# Patient Record
Sex: Female | Born: 1965 | Race: Black or African American | Hispanic: No | Marital: Single | Smoking: Current every day smoker
Health system: Southern US, Community
[De-identification: ages and names within clinical notes are randomized; demographics above are authoritative.]

## PROBLEM LIST (undated history)

## (undated) DIAGNOSIS — N611 Abscess of the breast and nipple: Secondary | ICD-10-CM

## (undated) DIAGNOSIS — I10 Essential (primary) hypertension: Secondary | ICD-10-CM

## (undated) DIAGNOSIS — N179 Acute kidney failure, unspecified: Secondary | ICD-10-CM

## (undated) HISTORY — PX: OTHER SURGICAL HISTORY: SHX169

---

## 1998-09-03 ENCOUNTER — Emergency Department (HOSPITAL_COMMUNITY): Admission: EM | Admit: 1998-09-03 | Discharge: 1998-09-03 | Payer: Self-pay | Admitting: Emergency Medicine

## 1999-01-23 ENCOUNTER — Emergency Department (HOSPITAL_COMMUNITY): Admission: EM | Admit: 1999-01-23 | Discharge: 1999-01-23 | Payer: Self-pay

## 1999-02-05 ENCOUNTER — Emergency Department (HOSPITAL_COMMUNITY): Admission: EM | Admit: 1999-02-05 | Discharge: 1999-02-05 | Payer: Self-pay | Admitting: Emergency Medicine

## 1999-08-30 ENCOUNTER — Inpatient Hospital Stay (HOSPITAL_COMMUNITY): Admission: EM | Admit: 1999-08-30 | Discharge: 1999-09-01 | Payer: Self-pay

## 1999-12-03 ENCOUNTER — Emergency Department (HOSPITAL_COMMUNITY): Admission: EM | Admit: 1999-12-03 | Discharge: 1999-12-03 | Payer: Self-pay | Admitting: Emergency Medicine

## 1999-12-20 ENCOUNTER — Encounter: Admission: RE | Admit: 1999-12-20 | Discharge: 1999-12-20 | Payer: Self-pay | Admitting: Internal Medicine

## 2000-01-18 ENCOUNTER — Emergency Department (HOSPITAL_COMMUNITY): Admission: EM | Admit: 2000-01-18 | Discharge: 2000-01-19 | Payer: Self-pay | Admitting: Emergency Medicine

## 2000-01-19 ENCOUNTER — Encounter: Payer: Self-pay | Admitting: Emergency Medicine

## 2000-01-29 ENCOUNTER — Encounter: Admission: RE | Admit: 2000-01-29 | Discharge: 2000-01-29 | Payer: Self-pay | Admitting: Internal Medicine

## 2000-02-14 ENCOUNTER — Emergency Department (HOSPITAL_COMMUNITY): Admission: EM | Admit: 2000-02-14 | Discharge: 2000-02-15 | Payer: Self-pay | Admitting: Emergency Medicine

## 2000-02-14 ENCOUNTER — Encounter: Payer: Self-pay | Admitting: Emergency Medicine

## 2000-02-25 ENCOUNTER — Emergency Department (HOSPITAL_COMMUNITY): Admission: EM | Admit: 2000-02-25 | Discharge: 2000-02-25 | Payer: Self-pay | Admitting: Emergency Medicine

## 2000-03-09 ENCOUNTER — Emergency Department (HOSPITAL_COMMUNITY): Admission: EM | Admit: 2000-03-09 | Discharge: 2000-03-09 | Payer: Self-pay | Admitting: Emergency Medicine

## 2000-03-18 ENCOUNTER — Encounter: Admission: RE | Admit: 2000-03-18 | Discharge: 2000-03-18 | Payer: Self-pay | Admitting: Internal Medicine

## 2000-07-27 ENCOUNTER — Emergency Department (HOSPITAL_COMMUNITY): Admission: EM | Admit: 2000-07-27 | Discharge: 2000-07-27 | Payer: Self-pay | Admitting: Emergency Medicine

## 2000-12-30 ENCOUNTER — Encounter: Admission: RE | Admit: 2000-12-30 | Discharge: 2000-12-30 | Payer: Self-pay | Admitting: Internal Medicine

## 2001-01-11 ENCOUNTER — Encounter: Admission: RE | Admit: 2001-01-11 | Discharge: 2001-01-11 | Payer: Self-pay | Admitting: Internal Medicine

## 2001-07-02 ENCOUNTER — Emergency Department (HOSPITAL_COMMUNITY): Admission: EM | Admit: 2001-07-02 | Discharge: 2001-07-02 | Payer: Self-pay | Admitting: Emergency Medicine

## 2001-07-02 ENCOUNTER — Encounter: Payer: Self-pay | Admitting: Emergency Medicine

## 2001-11-29 ENCOUNTER — Emergency Department (HOSPITAL_COMMUNITY): Admission: EM | Admit: 2001-11-29 | Discharge: 2001-11-29 | Payer: Self-pay | Admitting: Emergency Medicine

## 2001-12-12 ENCOUNTER — Emergency Department (HOSPITAL_COMMUNITY): Admission: EM | Admit: 2001-12-12 | Discharge: 2001-12-12 | Payer: Self-pay | Admitting: Emergency Medicine

## 2001-12-12 ENCOUNTER — Encounter: Payer: Self-pay | Admitting: Emergency Medicine

## 2002-03-17 ENCOUNTER — Encounter (INDEPENDENT_AMBULATORY_CARE_PROVIDER_SITE_OTHER): Payer: Self-pay | Admitting: Specialist

## 2002-03-17 ENCOUNTER — Encounter: Admission: RE | Admit: 2002-03-17 | Discharge: 2002-03-17 | Payer: Self-pay | Admitting: Obstetrics and Gynecology

## 2002-03-17 ENCOUNTER — Other Ambulatory Visit: Admission: RE | Admit: 2002-03-17 | Discharge: 2002-03-17 | Payer: Self-pay | Admitting: Obstetrics and Gynecology

## 2002-03-31 ENCOUNTER — Ambulatory Visit (HOSPITAL_COMMUNITY): Admission: RE | Admit: 2002-03-31 | Discharge: 2002-03-31 | Payer: Self-pay | Admitting: Obstetrics and Gynecology

## 2002-08-02 ENCOUNTER — Encounter: Admission: RE | Admit: 2002-08-02 | Discharge: 2002-08-02 | Payer: Self-pay | Admitting: Internal Medicine

## 2002-08-05 ENCOUNTER — Ambulatory Visit (HOSPITAL_COMMUNITY): Admission: RE | Admit: 2002-08-05 | Discharge: 2002-08-05 | Payer: Self-pay | Admitting: Internal Medicine

## 2002-11-16 ENCOUNTER — Emergency Department (HOSPITAL_COMMUNITY): Admission: EM | Admit: 2002-11-16 | Discharge: 2002-11-16 | Payer: Self-pay | Admitting: Emergency Medicine

## 2002-11-16 ENCOUNTER — Encounter: Payer: Self-pay | Admitting: Emergency Medicine

## 2003-01-05 ENCOUNTER — Emergency Department (HOSPITAL_COMMUNITY): Admission: EM | Admit: 2003-01-05 | Discharge: 2003-01-05 | Payer: Self-pay | Admitting: Emergency Medicine

## 2003-05-10 ENCOUNTER — Emergency Department (HOSPITAL_COMMUNITY): Admission: EM | Admit: 2003-05-10 | Discharge: 2003-05-10 | Payer: Self-pay | Admitting: Emergency Medicine

## 2003-05-10 ENCOUNTER — Encounter: Payer: Self-pay | Admitting: Emergency Medicine

## 2003-06-15 ENCOUNTER — Encounter: Admission: RE | Admit: 2003-06-15 | Discharge: 2003-06-15 | Payer: Self-pay | Admitting: Internal Medicine

## 2003-07-31 ENCOUNTER — Encounter: Payer: Self-pay | Admitting: Emergency Medicine

## 2003-07-31 ENCOUNTER — Emergency Department (HOSPITAL_COMMUNITY): Admission: EM | Admit: 2003-07-31 | Discharge: 2003-07-31 | Payer: Self-pay | Admitting: Emergency Medicine

## 2003-09-12 ENCOUNTER — Encounter: Admission: RE | Admit: 2003-09-12 | Discharge: 2003-09-12 | Payer: Self-pay | Admitting: Internal Medicine

## 2003-09-15 ENCOUNTER — Ambulatory Visit (HOSPITAL_COMMUNITY): Admission: RE | Admit: 2003-09-15 | Discharge: 2003-09-15 | Payer: Self-pay | Admitting: Internal Medicine

## 2004-03-01 ENCOUNTER — Emergency Department (HOSPITAL_COMMUNITY): Admission: EM | Admit: 2004-03-01 | Discharge: 2004-03-01 | Payer: Self-pay | Admitting: Emergency Medicine

## 2004-07-30 ENCOUNTER — Emergency Department (HOSPITAL_COMMUNITY): Admission: EM | Admit: 2004-07-30 | Discharge: 2004-07-31 | Payer: Self-pay

## 2004-08-02 ENCOUNTER — Emergency Department (HOSPITAL_COMMUNITY): Admission: EM | Admit: 2004-08-02 | Discharge: 2004-08-03 | Payer: Self-pay | Admitting: Emergency Medicine

## 2004-09-21 ENCOUNTER — Emergency Department (HOSPITAL_COMMUNITY): Admission: EM | Admit: 2004-09-21 | Discharge: 2004-09-21 | Payer: Self-pay | Admitting: Family Medicine

## 2004-09-22 ENCOUNTER — Emergency Department (HOSPITAL_COMMUNITY): Admission: EM | Admit: 2004-09-22 | Discharge: 2004-09-22 | Payer: Self-pay | Admitting: Emergency Medicine

## 2004-09-30 ENCOUNTER — Emergency Department (HOSPITAL_COMMUNITY): Admission: EM | Admit: 2004-09-30 | Discharge: 2004-10-01 | Payer: Self-pay | Admitting: Emergency Medicine

## 2004-11-05 ENCOUNTER — Ambulatory Visit: Payer: Self-pay | Admitting: Internal Medicine

## 2005-08-06 ENCOUNTER — Emergency Department (HOSPITAL_COMMUNITY): Admission: EM | Admit: 2005-08-06 | Discharge: 2005-08-06 | Payer: Self-pay | Admitting: Emergency Medicine

## 2007-10-22 ENCOUNTER — Emergency Department (HOSPITAL_COMMUNITY): Admission: EM | Admit: 2007-10-22 | Discharge: 2007-10-22 | Payer: Self-pay | Admitting: Family Medicine

## 2009-02-18 ENCOUNTER — Emergency Department (HOSPITAL_COMMUNITY): Admission: EM | Admit: 2009-02-18 | Discharge: 2009-02-18 | Payer: Self-pay | Admitting: Emergency Medicine

## 2010-02-06 ENCOUNTER — Emergency Department (HOSPITAL_COMMUNITY): Admission: EM | Admit: 2010-02-06 | Discharge: 2010-02-07 | Payer: Self-pay | Admitting: Emergency Medicine

## 2010-02-07 ENCOUNTER — Ambulatory Visit (HOSPITAL_COMMUNITY): Admission: RE | Admit: 2010-02-07 | Discharge: 2010-02-07 | Payer: Self-pay | Admitting: Orthopedic Surgery

## 2010-07-07 ENCOUNTER — Ambulatory Visit: Payer: Self-pay | Admitting: Internal Medicine

## 2010-07-07 ENCOUNTER — Inpatient Hospital Stay (HOSPITAL_COMMUNITY): Admission: EM | Admit: 2010-07-07 | Discharge: 2010-07-09 | Payer: Self-pay | Admitting: Emergency Medicine

## 2010-07-15 ENCOUNTER — Telehealth (INDEPENDENT_AMBULATORY_CARE_PROVIDER_SITE_OTHER): Payer: Self-pay | Admitting: *Deleted

## 2010-07-18 ENCOUNTER — Telehealth (INDEPENDENT_AMBULATORY_CARE_PROVIDER_SITE_OTHER): Payer: Self-pay | Admitting: *Deleted

## 2010-07-22 ENCOUNTER — Telehealth (INDEPENDENT_AMBULATORY_CARE_PROVIDER_SITE_OTHER): Payer: Self-pay

## 2010-07-23 ENCOUNTER — Telehealth (INDEPENDENT_AMBULATORY_CARE_PROVIDER_SITE_OTHER): Payer: Self-pay | Admitting: *Deleted

## 2010-07-24 ENCOUNTER — Encounter: Payer: Self-pay | Admitting: Cardiovascular Disease

## 2010-07-24 ENCOUNTER — Ambulatory Visit: Payer: Self-pay

## 2010-07-24 ENCOUNTER — Encounter (HOSPITAL_COMMUNITY): Admission: RE | Admit: 2010-07-24 | Discharge: 2010-08-16 | Payer: Self-pay | Admitting: Cardiology

## 2010-07-24 ENCOUNTER — Ambulatory Visit: Payer: Self-pay | Admitting: Cardiovascular Disease

## 2010-08-06 ENCOUNTER — Ambulatory Visit: Payer: Self-pay | Admitting: Family Medicine

## 2010-08-06 ENCOUNTER — Encounter: Payer: Self-pay | Admitting: Physician Assistant

## 2010-08-06 DIAGNOSIS — E785 Hyperlipidemia, unspecified: Secondary | ICD-10-CM

## 2010-08-06 DIAGNOSIS — F411 Generalized anxiety disorder: Secondary | ICD-10-CM | POA: Insufficient documentation

## 2010-08-06 DIAGNOSIS — I10 Essential (primary) hypertension: Secondary | ICD-10-CM

## 2010-08-06 DIAGNOSIS — F17200 Nicotine dependence, unspecified, uncomplicated: Secondary | ICD-10-CM | POA: Insufficient documentation

## 2010-08-23 ENCOUNTER — Ambulatory Visit: Payer: Self-pay | Admitting: Physician Assistant

## 2010-08-23 ENCOUNTER — Ambulatory Visit: Payer: Self-pay | Admitting: Internal Medicine

## 2010-08-23 LAB — CONVERTED CEMR LAB
BUN: 10 mg/dL (ref 6–23)
Bilirubin Urine: NEGATIVE
Blood in Urine, dipstick: NEGATIVE
CO2: 26 meq/L (ref 19–32)
Calcium: 8.8 mg/dL (ref 8.4–10.5)
Chloride: 101 meq/L (ref 96–112)
Creatinine, Ser: 0.74 mg/dL (ref 0.40–1.20)
Glucose, Bld: 80 mg/dL (ref 70–99)
Glucose, Urine, Semiquant: NEGATIVE
Ketones, urine, test strip: NEGATIVE
Nitrite: NEGATIVE
Potassium: 3.8 meq/L (ref 3.5–5.3)
Protein, U semiquant: NEGATIVE
Sodium: 138 meq/L (ref 135–145)
Specific Gravity, Urine: 1.015
Urobilinogen, UA: 0.2
WBC Urine, dipstick: NEGATIVE
pH: 6

## 2010-08-27 ENCOUNTER — Encounter: Payer: Self-pay | Admitting: Physician Assistant

## 2010-09-30 ENCOUNTER — Emergency Department (HOSPITAL_COMMUNITY): Admission: EM | Admit: 2010-09-30 | Discharge: 2010-09-30 | Payer: Self-pay | Admitting: Emergency Medicine

## 2010-12-14 ENCOUNTER — Encounter: Payer: Self-pay | Admitting: Internal Medicine

## 2010-12-26 NOTE — Letter (Signed)
Summary: *HSN Results Follow up  [facility]  [address] [place], [place] [address]   Phone: [phone]  Fax: [phone]      08/27/2010   Tracie Estrada Heisler [address] [place], [place]  [address]   Dear  Ms. [name],                            ____S.Drinkard,FNP   ____D. Gore,FNP       ____B. McPherson,MD   ____V. Rankins,MD    ____E. Mulberry,MD    ____N. Daphine Deutscher, FNP  ____D. Reche Dixon, MD    ____K. Philipp Deputy, MD    __x__S. Alben Spittle, PA-C     This letter is to inform you that your recent test(s):  _______Pap Smear    ___x____Lab Test     _______X-ray    ___x____ is within acceptable limits  _______ requires a medication change  _______ requires a follow-up lab visit  _______ requires a follow-up visit with your [name]   Comments:       _________________________________________________________ If you have any questions, please contact our office                     Sincerely,  Tereso Newcomer PA-C [facility]
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
## 2010-12-26 NOTE — Progress Notes (Signed)
Summary: nuc pre procedure  Phone Note Outgoing Call   Call placed by: Darrick Penna Call placed to: Patient Reason for Call: Confirm/change Appt Summary of Call: Home ph #  is disconnected.  Contact person is Rosemarie Ax at [phone]. Spoke with her and she will try to contact pt and have her call us back.     Nuclear Med Background Indications for Stress Test: Evaluation for Ischemia, Post Hospital, Abnormal EKG  Indications Comments: T wave changes secondary to LV hypertrophy Enzymes neg    Symptoms: Chest Pain  Symptoms Comments: Hx of panic attacks    Nuclear Pre-Procedure Cardiac Risk Factors: Hypertension, Lipids, Smoker

## 2010-12-26 NOTE — Progress Notes (Signed)
Summary: Nuc Pre-Procedure  Phone Note Outgoing Call Call back at [phone]   Call placed by: Antionette Char RN,  July 18, 2010 11:27 AM Call placed to: Patient Reason for Call: Confirm/change Appt Summary of Call: Reviewed information on Myoview Information Sheet (see scanned document for further details).  Spoke with sister Elana Alm.     Nuclear Med Background Indications for Stress Test: Evaluation for Ischemia, Post Hospital, Abnormal EKG  Indications Comments: T wave changes secondary to LV hypertrophy Enzymes neg    Symptoms: Chest Pain  Symptoms Comments: Hx of panic attacks    Nuclear Pre-Procedure Cardiac Risk Factors: Hypertension, Lipids, Smoker

## 2010-12-26 NOTE — Progress Notes (Signed)
Summary: Sedation for myoview due to claustrophobia  Phone Note Outgoing Call   Call placed by: Irean Hong, RN,  July 22, 2010 2:49 PM Summary of Call: The patient here for myoview, and unable to tolerate the camera due to claustrophobia. The patient doesn't have a ride and prefers to call us back to reschedule so she can have sedation. Xanax 0.5 mg by mouth prior to images the day of myoview as long as the patient has a driver per Dr. Algis Downs. [name]. Patsy Edwards,RN.

## 2010-12-26 NOTE — Letter (Signed)
Summary: PT INFORMATION SHEET  PT INFORMATION SHEET   Imported By: Arta Bruce 08/08/2010 10:54:03  _____________________________________________________________________  External Attachment:    Type:   Image     Comment:   External Document

## 2010-12-26 NOTE — Assessment & Plan Note (Signed)
Summary: Cardiology Nuclear Testing  Nuclear Med Background Indications for Stress Test: Evaluation for Ischemia, Post Hospital, Abnormal EKG  Indications Comments: 07/09/10 chest pain, MI r/o    Symptoms: Chest Pain, Chest Pressure, Chest Pressure with Exertion, DOE  Symptoms Comments: .   Nuclear Pre-Procedure Cardiac Risk Factors: Hypertension, Lipids, Smoker Caffeine/Decaff Intake: None NPO After: 10:00 PM Lungs: clear  IV 0.9% NS with Angio Cath: 22g     IV Site: R Antecubital IV Started by: Irean Hong, RN Chest Size (in) 42     Cup Size D     Height (in): 59 Weight (lb): 162 BMI: 32.84 Tech Comments: Held coreg 24 hrs. Alprazolam 0.5 mg by mouth on arrival due to claustrophobia per Dr. Golden Circle.  Nuclear Med Study 1 or 2 day study:  1 day     Stress Test Type:  Eugenie Birks Reading MD:  Charlton Haws, MD     Referring MD:  J.Katz Resting Radionuclide:  Technetium 40m Tetrofosmin     Resting Radionuclide Dose:  10.7 mCi  Stress Radionuclide:  Technetium 25m Tetrofosmin     Stress Radionuclide Dose:  33 mCi   Stress Protocol   Lexiscan: 0.4 mg   Stress Test Technologist:  Milana Na, EMT-P     Nuclear Technologist:  Doyne Keel, CNMT  Rest Procedure  Myocardial perfusion imaging was performed at rest 45 minutes following the intravenous administration of Technetium 40m Tetrofosmin.  Stress Procedure  The patient received IV Lexiscan 0.4 mg over 15-seconds.  Technetium 83m Tetrofosmin injected at 30-seconds.  There were no significant changes with infusion.  Quantitative spect images were obtained after a 45 minute delay.  QPS Raw Data Images:  Normal; no motion artifact; normal heart/lung ratio. Stress Images:  Normal homogeneous uptake in all areas of the myocardium. Rest Images:  Normal homogeneous uptake in all areas of the myocardium. Subtraction (SDS):  SDS 2 Transient Ischemic Dilatation:  1.02  (Normal <1.22)  Lung/Heart Ratio:  .16  (Normal  <0.45)  Quantitative Gated Spect Images QGS EDV:  106 ml QGS ESV:  46 ml QGS EF:  56 % QGS cine images:  LVE low normal EF  Findings Normal nuclear study      Overall Impression  Exercise Capacity: Lexiscan with no exercise. BP Response: Normal blood pressure response. Clinical Symptoms: Fatigue and SOB ECG Impression: Inferolateral T wave changes Overall Impression: No ischemia or infarct.  Appears to have some LVE  RV also prominant  Appended Document: Cardiology Nuclear Testing What is the f/u ?  Appended Document: Cardiology Nuclear Testing she is sch to see Dr [name] on 9/14

## 2010-12-26 NOTE — Progress Notes (Signed)
Summary: Nuclear pre procedure  Phone Note Outgoing Call Call back at [facility] Phone [phone]   Call placed by: Rea College, CMA,  July 23, 2010 11:43 AM Call placed to: Patient Summary of Call: Reviewed information on Myoview Information Sheet (see scanned document for further details).  Spoke with patient's sister.      Nuclear Med Background Indications for Stress Test: Evaluation for Ischemia, Post Hospital, Abnormal EKG  Indications Comments: 07/09/10 chest pain, MI r/o    Symptoms: Chest Pain, Chest Pressure, Chest Pressure with Exertion, DOE  Symptoms Comments: .   Nuclear Pre-Procedure Cardiac Risk Factors: Hypertension, Lipids, Smoker

## 2011-01-09 NOTE — Letter (Signed)
Summary: PSYCHOLOGY SUMMARY  PSYCHOLOGY SUMMARY   Imported By: Arta Bruce 12/31/2010 09:52:44  _____________________________________________________________________  External Attachment:    Type:   Image     Comment:   External Document

## 2011-02-06 LAB — CBC
HCT: 37.5 % (ref 36.0–46.0)
Hemoglobin: 12.9 g/dL (ref 12.0–15.0)
MCH: 29 pg (ref 26.0–34.0)
MCHC: 34.4 g/dL (ref 30.0–36.0)
MCV: 84.3 fL (ref 78.0–100.0)
Platelets: 331 10*3/uL (ref 150–400)
RBC: 4.45 MIL/uL (ref 3.87–5.11)
RDW: 14.4 % (ref 11.5–15.5)
WBC: 9.4 10*3/uL (ref 4.0–10.5)

## 2011-02-06 LAB — LIPID PANEL
Cholesterol: 171 mg/dL (ref 0–200)
HDL: 37 mg/dL — ABNORMAL LOW (ref >39)
LDL Cholesterol: 101 mg/dL — ABNORMAL HIGH (ref 0–99)
Total CHOL/HDL Ratio: 4.6 RATIO
Triglycerides: 164 mg/dL — ABNORMAL HIGH (ref <150)
VLDL: 33 mg/dL (ref 0–40)

## 2011-02-06 LAB — BASIC METABOLIC PANEL
BUN: 11 mg/dL (ref 6–23)
CO2: 25 mEq/L (ref 19–32)
Calcium: 8.9 mg/dL (ref 8.4–10.5)
Chloride: 104 mEq/L (ref 96–112)
Creatinine, Ser: 0.71 mg/dL (ref 0.4–1.2)
GFR calc Af Amer: >60 mL/min (ref >60)
GFR calc non Af Amer: >60 mL/min (ref >60)
Glucose, Bld: 100 mg/dL — ABNORMAL HIGH (ref 70–99)
Potassium: 3.6 mEq/L (ref 3.5–5.1)
Sodium: 139 mEq/L (ref 135–145)

## 2011-02-06 LAB — GLUCOSE, CAPILLARY: Glucose-Capillary: 95 mg/dL (ref 70–99)

## 2011-02-06 LAB — MRSA PCR SCREENING: MRSA by PCR: POSITIVE — AB

## 2011-02-07 LAB — DIFFERENTIAL
Basophils Relative: 0 % (ref 0–1)
Eosinophils Absolute: 0.2 10*3/uL (ref 0.0–0.7)
Eosinophils Relative: 2 % (ref 0–5)
Monocytes Absolute: 0.7 10*3/uL (ref 0.1–1.0)
Monocytes Relative: 7 % (ref 3–12)
Neutrophils Relative %: 58 % (ref 43–77)

## 2011-02-07 LAB — URINALYSIS, ROUTINE W REFLEX MICROSCOPIC
Glucose, UA: NEGATIVE mg/dL
Nitrite: NEGATIVE
pH: 6.5 (ref 5.0–8.0)

## 2011-02-07 LAB — BASIC METABOLIC PANEL
BUN: 8 mg/dL (ref 6–23)
CO2: 27 mEq/L (ref 19–32)
Calcium: 8.6 mg/dL (ref 8.4–10.5)
Chloride: 102 mEq/L (ref 96–112)
Creatinine, Ser: 0.67 mg/dL (ref 0.4–1.2)
GFR calc Af Amer: >60 mL/min (ref >60)
GFR calc non Af Amer: >60 mL/min (ref >60)
Glucose, Bld: 99 mg/dL (ref 70–99)
Potassium: 2.8 mEq/L — ABNORMAL LOW (ref 3.5–5.1)
Sodium: 137 mEq/L (ref 135–145)

## 2011-02-07 LAB — CARDIAC PANEL(CRET KIN+CKTOT+MB+TROPI)
CK, MB: 2.7 ng/mL (ref 0.3–4.0)
Relative Index: 1.3 (ref 0.0–2.5)
Total CK: 205 U/L — ABNORMAL HIGH (ref 7–177)
Troponin I: 0.01 ng/mL (ref 0.00–0.06)

## 2011-02-07 LAB — HEMOGLOBIN A1C
Hgb A1c MFr Bld: 5.8 % — ABNORMAL HIGH (ref <5.7)
Mean Plasma Glucose: 120 mg/dL — ABNORMAL HIGH (ref <117)

## 2011-02-07 LAB — CBC
Hemoglobin: 12.1 g/dL (ref 12.0–15.0)
MCH: 29.8 pg (ref 26.0–34.0)
MCHC: 35.8 g/dL (ref 30.0–36.0)
MCV: 83.3 fL (ref 78.0–100.0)

## 2011-02-07 LAB — POCT CARDIAC MARKERS: Myoglobin, poc: 74 ng/mL (ref 12–200)

## 2011-02-07 LAB — URINE MICROSCOPIC-ADD ON

## 2011-02-16 LAB — CBC
HCT: 40 % (ref 36.0–46.0)
Platelets: 356 10*3/uL (ref 150–400)
RDW: 14.5 % (ref 11.5–15.5)
WBC: 9.9 10*3/uL (ref 4.0–10.5)

## 2011-02-16 LAB — POCT I-STAT, CHEM 8
Creatinine, Ser: 0.8 mg/dL (ref 0.4–1.2)
Glucose, Bld: 93 mg/dL (ref 70–99)
HCT: 42 % (ref 36.0–46.0)
Hemoglobin: 14.3 g/dL (ref 12.0–15.0)
Potassium: 3.4 mEq/L — ABNORMAL LOW (ref 3.5–5.1)
TCO2: 33 mmol/L (ref 0–100)

## 2011-02-16 LAB — BASIC METABOLIC PANEL
BUN: 11 mg/dL (ref 6–23)
Calcium: 8.9 mg/dL (ref 8.4–10.5)
Creatinine, Ser: 0.72 mg/dL (ref 0.4–1.2)
GFR calc non Af Amer: >60 mL/min (ref >60)
Glucose, Bld: 95 mg/dL (ref 70–99)
Potassium: 3.1 mEq/L — ABNORMAL LOW (ref 3.5–5.1)

## 2011-02-16 LAB — URINALYSIS, ROUTINE W REFLEX MICROSCOPIC
Bilirubin Urine: NEGATIVE
Glucose, UA: NEGATIVE mg/dL
Ketones, ur: 15 mg/dL — AB
Specific Gravity, Urine: 1.01 (ref 1.005–1.030)
pH: 6 (ref 5.0–8.0)

## 2011-02-16 LAB — URINE MICROSCOPIC-ADD ON

## 2011-04-11 NOTE — Consult Note (Signed)
[place]  Patient:    Tracie Estrada, Tracie Estrada Visit Number: [id number] MRN: [id number]          Service Type: Attending:  Elinor Dodge, M.D. Dictated by:   Elinor Dodge, M.D. Proc. Date: 03/17/02                            Consultation Report  REASON FOR CONSULTATION:  The patient is a 45 year old black female who has had a history of oligomenorrhea and had regular periods this past year up through March. Her April period had started on time but since the beginning has gone on and off every few days.  PAST MEDICAL HISTORY:  The patient has a history of pseudotumor cerebri for which she in the past had taken Diamox. She has not taken Diamox within two years, had a recent checkup in which her eye-ground and visual fields were normal, although no other diagnostic tests were done. She is now on hydrochlorothiazide for hypertension, has has one pregnancy, and has never used contraception.  RULE OUT IMPRESSION:  Menometrorrhagia.  PHYSICAL EXAMINATION:  PELVIC:  EGBUS within normal limits. The vagina clean, well rugated. Cervix has a moderate amount of bleeding from paracervical os and the uterus is upper limits of normal. The adnexa could not be well palpated.  PLAN:  Get a pelvic sonogram and then to observe throughout this cycle and to see, then decide on therapy if the bleeding continues. I have an aversion to using oral contraceptives since this is one of the etiologies occasionally for pseudotumorous cerebri. Dictated by:   Elinor Dodge, M.D. Attending:  Elinor Dodge, M.D. DD:  03/17/02 TD:  03/17/02 Job: [id number]

## 2013-02-16 ENCOUNTER — Emergency Department (HOSPITAL_COMMUNITY)
Admission: EM | Admit: 2013-02-16 | Discharge: 2013-02-16 | Disposition: A | Discharge status: Home / Self Care | Payer: Self-pay | Attending: Emergency Medicine | Admitting: Emergency Medicine

## 2013-02-16 ENCOUNTER — Emergency Department (HOSPITAL_COMMUNITY): Payer: Self-pay

## 2013-02-16 ENCOUNTER — Encounter (HOSPITAL_COMMUNITY): Payer: Self-pay | Admitting: Emergency Medicine

## 2013-02-16 DIAGNOSIS — I160 Hypertensive urgency: Secondary | ICD-10-CM

## 2013-02-16 DIAGNOSIS — I10 Essential (primary) hypertension: Secondary | ICD-10-CM | POA: Insufficient documentation

## 2013-02-16 DIAGNOSIS — F17200 Nicotine dependence, unspecified, uncomplicated: Secondary | ICD-10-CM | POA: Insufficient documentation

## 2013-02-16 DIAGNOSIS — M549 Dorsalgia, unspecified: Secondary | ICD-10-CM

## 2013-02-16 HISTORY — DX: Essential (primary) hypertension: I10

## 2013-02-16 LAB — URINALYSIS, ROUTINE W REFLEX MICROSCOPIC
Hgb urine dipstick: NEGATIVE
Nitrite: NEGATIVE
Specific Gravity, Urine: >1.046 — ABNORMAL HIGH (ref 1.005–1.030)
Urobilinogen, UA: 0.2 mg/dL (ref 0.0–1.0)
pH: 7 (ref 5.0–8.0)

## 2013-02-16 LAB — CBC WITH DIFFERENTIAL/PLATELET
Basophils Absolute: 0 10*3/uL (ref 0.0–0.1)
Eosinophils Relative: 1 % (ref 0–5)
HCT: 34.9 % — ABNORMAL LOW (ref 36.0–46.0)
Lymphocytes Relative: 24 % (ref 12–46)
Lymphs Abs: 2 10*3/uL (ref 0.7–4.0)
MCV: 80.8 fL (ref 78.0–100.0)
Monocytes Absolute: 0.3 10*3/uL (ref 0.1–1.0)
Neutro Abs: 5.7 10*3/uL (ref 1.7–7.7)
RBC: 4.32 MIL/uL (ref 3.87–5.11)
RDW: 15.2 % (ref 11.5–15.5)
WBC: 8.1 10*3/uL (ref 4.0–10.5)

## 2013-02-16 LAB — BASIC METABOLIC PANEL
CO2: 29 mEq/L (ref 19–32)
Chloride: 97 mEq/L (ref 96–112)
Creatinine, Ser: 0.87 mg/dL (ref 0.50–1.10)
GFR calc Af Amer: 90 mL/min — ABNORMAL LOW (ref >90)
Potassium: 3.2 mEq/L — ABNORMAL LOW (ref 3.5–5.1)
Sodium: 136 mEq/L (ref 135–145)

## 2013-02-16 LAB — TROPONIN I: Troponin I: <0.3 ng/mL (ref <0.30)

## 2013-02-16 MED ORDER — IBUPROFEN 400 MG PO TABS: 400.0000 mg | ORAL_TABLET | Freq: Four times a day (QID) | ORAL | Status: DC | PRN

## 2013-02-16 MED ORDER — IOHEXOL 300 MG/ML  SOLN
50.0000 mL | Freq: Once | INTRAMUSCULAR | Status: AC | PRN
  Administered 2013-02-16: 50 mL via ORAL

## 2013-02-16 MED ORDER — CLONIDINE HCL 0.1 MG PO TABS
0.2000 mg | ORAL_TABLET | Freq: Once | ORAL | Status: AC
  Administered 2013-02-16: 0.2 mg via ORAL
  Filled 2013-02-16: qty 2

## 2013-02-16 MED ORDER — OXYCODONE-ACETAMINOPHEN 5-325 MG PO TABS
2.0000 | ORAL_TABLET | Freq: Once | ORAL | Status: AC
  Administered 2013-02-16: 1 via ORAL
  Filled 2013-02-16: qty 2

## 2013-02-16 MED ORDER — HYDROCODONE-ACETAMINOPHEN 5-325 MG PO TABS: 1.0000 | ORAL_TABLET | Freq: Four times a day (QID) | ORAL | Status: DC | PRN

## 2013-02-16 MED ORDER — MORPHINE SULFATE 4 MG/ML IJ SOLN
4.0000 mg | Freq: Once | INTRAMUSCULAR | Status: AC
Start: 2013-02-16 — End: 2013-02-16
  Administered 2013-02-16: 4 mg via INTRAVENOUS
  Filled 2013-02-16: qty 1

## 2013-02-16 MED ORDER — NITROGLYCERIN 0.4 MG SL SUBL
0.4000 mg | SUBLINGUAL_TABLET | SUBLINGUAL | Status: DC | PRN
  Administered 2013-02-16 (×2): 0.4 mg via SUBLINGUAL
  Filled 2013-02-16: qty 25

## 2013-02-16 MED ORDER — LORAZEPAM 2 MG/ML IJ SOLN
1.0000 mg | Freq: Once | INTRAMUSCULAR | Status: AC
  Administered 2013-02-16: 1 mg via INTRAVENOUS
  Filled 2013-02-16: qty 1

## 2013-02-16 MED ORDER — IOHEXOL 300 MG/ML  SOLN
100.0000 mL | Freq: Once | INTRAMUSCULAR | Status: AC | PRN
  Administered 2013-02-16: 100 mL via INTRAVENOUS

## 2013-02-16 MED ORDER — CARVEDILOL 12.5 MG PO TABS: 12.5000 mg | ORAL_TABLET | Freq: Two times a day (BID) | ORAL | Status: DC

## 2013-02-16 NOTE — ED Notes (Addendum)
Patient reports left lower back pain.  Patient denies injury and states the pain has been going on since about 1600 yesterday. Patient also has a h/o hypertension and is non-compliant with meds.  Patient denies chest pain, N/V, or any other associated cardiac symptoms.

## 2013-02-16 NOTE — ED Notes (Signed)
Pt reported headache and some chest pain 9/10 after 2 nitroglycerin tablets. 3rd nitro tablet not given. Pt tearful, but not due to pain.

## 2013-02-16 NOTE — ED Notes (Signed)
pt reports she will not follow up with mamogram due to fear even after rn education

## 2013-02-16 NOTE — ED Notes (Signed)
Pt reports her back pain 5/10. And that headache from nitroglycerin has stopped.

## 2013-02-16 NOTE — ED Notes (Addendum)
Pt back from CT  Pt vomited in CT

## 2013-02-16 NOTE — ED Notes (Signed)
Pt escorted to discharge window. Pt verbalized understanding discharge instructions. In no acute distress.  

## 2013-02-16 NOTE — ED Notes (Signed)
Pt to CT

## 2013-02-16 NOTE — ED Provider Notes (Signed)
History     CSN: [id number]  Arrival date & time 02/16/13  1335   First MD Initiated Contact with Patient 02/16/13 1352      Chief Complaint  Patient presents with  . Back Pain    (Consider location/radiation/quality/duration/timing/severity/associated sxs/prior treatment) HPI Comments: Pt with uncontrolled HTN, and non compliance with her antihypertensives comes in with cc of back pain. Pt states that her pain started y'day, sudden onset, and is located on the right lower quadrant and radiates to the left side. No midline spine tenderness. No associated numbness, weakness, urinary incontinence, urinary retention, bowel incontinence, saddle anesthesia. Pt has been ambulating. She denies any chest pain, dib, anterior abd pain, dizziness. Pt has no hx of back pain, or any preceding trauma. No personal hx of cancer, diabetes, immunocompromised state. Pt has taken motrin for pain with minimal relief. Pt has no hx of UTI, no uti like sx, and no hx of renal stones, no vaginal d/c or bleeding.  Pt's BP is noted to be elevated in the 250/120 range. Admits to not taking her meds for years, and not sure what she was prescribed. Pt has no headaches, chest pain, dib, visual complains.   Patient is a 47 y.o. female presenting with back pain. The history is provided by the patient.  Back Pain Associated symptoms: no abdominal pain, no chest pain and no dysuria     Past Medical History  Diagnosis Date  . Hypertension     No past surgical history on file.  No family history on file.  History  Substance Use Topics  . Smoking status: Current Every Day Smoker -- 1.00 packs/day    Types: Cigarettes  . Smokeless tobacco: Not on file  . Alcohol Use: No    OB History   Grav Para Term Preterm Abortions TAB SAB Ect Mult Living                  Review of Systems  Constitutional: Negative for activity change.  HENT: Negative for facial swelling and neck pain.   Respiratory: Negative for  cough, shortness of breath and wheezing.   Cardiovascular: Negative for chest pain.  Gastrointestinal: Negative for nausea, vomiting, abdominal pain, diarrhea, constipation, blood in stool and abdominal distention.  Genitourinary: Negative for dysuria, urgency, hematuria, vaginal bleeding, vaginal discharge and difficulty urinating.  Musculoskeletal: Positive for back pain. Negative for gait problem.  Skin: Negative for color change.  Neurological: Negative for speech difficulty.  Hematological: Does not bruise/bleed easily.  Psychiatric/Behavioral: Negative for confusion.    Allergies  Fish allergy; Other; and Peanuts  Home Medications   Current Outpatient Rx  Name  Route  Sig  Dispense  Refill  . ibuprofen (ADVIL,MOTRIN) 200 MG tablet   Oral   Take 600 mg by mouth every 6 (six) hours as needed for pain.           BP 213/111  Pulse 87  Temp(Src) 98.4 F (36.9 C) (Oral)  Resp 21  SpO2 100%  LMP 01/19/2013  Physical Exam  Nursing note and vitals reviewed. Constitutional: She is oriented to person, place, and time. She appears well-developed.  HENT:  Head: Normocephalic and atraumatic.  Eyes: Conjunctivae and EOM are normal. Pupils are equal, round, and reactive to light.  Neck: Normal range of motion. Neck supple.  Cardiovascular: Normal rate, regular rhythm and normal heart sounds.   Pulmonary/Chest: Effort normal and breath sounds normal. No respiratory distress.  Abdominal: Soft. Bowel sounds are normal. She exhibits  no distension. There is no tenderness. There is no rebound and no guarding.  Musculoskeletal:  Pt has NO tenderness over the lumbar region No step offs, no erythema. Pt has 2+ patellar reflex bilaterally. Able to discriminate between sharp and dull. Able to ambulate Tenderness over the lateral lumbar area, flank area.  Neurological: She is alert and oriented to person, place, and time.  Skin: Skin is warm and dry.    ED Course  Procedures  (including critical care time)  Labs Reviewed  BASIC METABOLIC PANEL - Abnormal; Notable for the following:    Potassium 3.2 (*)    Glucose, Bld 146 (*)    GFR calc non Af Amer 78 (*)    GFR calc Af Amer 90 (*)    All other components within normal limits  CBC WITH DIFFERENTIAL - Abnormal; Notable for the following:    Hemoglobin 11.9 (*)    HCT 34.9 (*)    All other components within normal limits  TROPONIN I  URINALYSIS, ROUTINE W REFLEX MICROSCOPIC   Dg Chest Port 1 View  02/16/2013  *RADIOLOGY REPORT*  Clinical Data: Back pain, hypertension, smoker, shortness of breath  PORTABLE CHEST - 1 VIEW  Comparison: 09/30/2010; 07/07/2010; 02/07/2010  Findings:  Grossly unchanged cardiac silhouette and mediastinal contours did not slightly reduced lung volumes.  Bibasilar opacities are favored to represent atelectasis, likely accentuated secondary to overlying soft tissues.  No definite focal airspace opacities.  No definite pleural effusion or pneumothorax.  Mild pulmonary venous congestion without frank evidence of edema.  Unchanged bones.  IMPRESSION: No definite acute cardiopulmonary disease on this low volume AP portable examination.  Further evaluation with a PA and lateral chest radiograph may be obtained as clinically indicated.   Original Report Authenticated By: Tacey Ruiz, MD      No diagnosis found.    MDM   Date: 02/16/2013  Rate: 85  Rhythm: normal sinus rhythm  QRS Axis: normal  Intervals: normal  ST/T Wave abnormalities: normal  Conduction Disutrbances: none  Narrative Interpretation: unremarkable    Hypertensive patient comes in with cc of elevated BP - with no associated sx and new back pain. Pt's vascular exam - bilateral upper and lower extremity pulses are equal and bounding. Chest exam shows no murmurs. Initial impression was that patient might have dissection - however, after seeing the patient and evaluating her, concerns are quite low. Her back pain is not  typical however, and not suggestive of any specific GI, GU pathology. Spoke with Radiology, and incidence of isolated abd aorta dissection is extremely low - and so he recommends just getting CT abd with contrast to get better evaluation - which we will.  Pt will be given clonidine for her HTN urgency. Will get some end organ labs and screening ekg. I will start her on lisinopril, and i have instructed her to see a PCP - resources in the community provided. Advised to come to the ER if there is chest pain, dib, visual complains, headaches.   Derwood Kaplan, MD 02/17/13 [phone]

## 2013-08-07 ENCOUNTER — Encounter (HOSPITAL_COMMUNITY): Payer: Self-pay | Admitting: *Deleted

## 2013-08-07 ENCOUNTER — Emergency Department (HOSPITAL_COMMUNITY)
Admission: EM | Admit: 2013-08-07 | Discharge: 2013-08-07 | Disposition: A | Discharge status: Home / Self Care | Payer: Self-pay | Attending: Emergency Medicine | Admitting: Emergency Medicine

## 2013-08-07 DIAGNOSIS — I10 Essential (primary) hypertension: Secondary | ICD-10-CM | POA: Insufficient documentation

## 2013-08-07 DIAGNOSIS — F17200 Nicotine dependence, unspecified, uncomplicated: Secondary | ICD-10-CM | POA: Insufficient documentation

## 2013-08-07 DIAGNOSIS — N63 Unspecified lump in unspecified breast: Secondary | ICD-10-CM | POA: Insufficient documentation

## 2013-08-07 DIAGNOSIS — Z79899 Other long term (current) drug therapy: Secondary | ICD-10-CM | POA: Insufficient documentation

## 2013-08-07 DIAGNOSIS — N6459 Other signs and symptoms in breast: Secondary | ICD-10-CM | POA: Insufficient documentation

## 2013-08-07 LAB — BASIC METABOLIC PANEL
CO2: 26 mEq/L (ref 19–32)
Calcium: 8.8 mg/dL (ref 8.4–10.5)
Chloride: 102 mEq/L (ref 96–112)
Sodium: 136 mEq/L (ref 135–145)

## 2013-08-07 MED ORDER — AMLODIPINE BESYLATE 10 MG PO TABS: 5.0000 mg | ORAL_TABLET | Freq: Every day | ORAL | Status: DC

## 2013-08-07 MED ORDER — AMLODIPINE BESYLATE 5 MG PO TABS
5.0000 mg | ORAL_TABLET | Freq: Once | ORAL | Status: AC
  Administered 2013-08-07: 5 mg via ORAL
  Filled 2013-08-07: qty 1

## 2013-08-07 NOTE — ED Provider Notes (Signed)
CSN: [id number]     Arrival date & time 08/07/13  1349 History   First MD Initiated Contact with Patient 08/07/13 1355     Chief Complaint  Patient presents with  . Breast Pain   (Consider location/radiation/quality/duration/timing/severity/associated sxs/prior Treatment) HPI This a 47 year old female who presents with a knot in her left breast. The patient reports that she has had a lump in her left rest for "many years." She had a mammogram sometime in the 90s which was reportedly normal. Patient states that since Thursday she has began to have bloody drainage from her left nipple. She states that she's having increasing pain of the left breast. She denies any systemic symptoms including fever, nausea, vomiting, chest pain, shortness breath, abdominal pain, urinary symptoms. She came today because her daughter urged her to see a physician. She does not have a primary care physician right now.  She does have a family history of breast cancer. Past Medical History  Diagnosis Date  . Hypertension    History reviewed. No pertinent past surgical history. No family history on file. History  Substance Use Topics  . Smoking status: Current Every Day Smoker -- 1.00 packs/day    Types: Cigarettes  . Smokeless tobacco: Never Used  . Alcohol Use: No   OB History   Grav Para Term Preterm Abortions TAB SAB Ect Mult Living                 Review of Systems  Constitutional: Negative for fever.  Respiratory: Negative for cough, chest tightness and shortness of breath.   Cardiovascular: Negative for chest pain.  Gastrointestinal: Negative for nausea, vomiting and abdominal pain.  Genitourinary: Negative for dysuria.  Musculoskeletal: Negative for back pain.  Skin: Negative for wound.  Neurological: Negative for dizziness, syncope and headaches.  All other systems reviewed and are negative.    Allergies  Fish allergy; Peanuts; and Other  Home Medications   Current Outpatient Rx  Name   Route  Sig  Dispense  Refill  . ibuprofen (ADVIL,MOTRIN) 200 MG tablet   Oral   Take 600 mg by mouth every 6 (six) hours as needed for pain.         Marland Kitchen amLODipine (NORVASC) 10 MG tablet   Oral   Take 0.5 tablets (5 mg total) by mouth daily.   30 tablet   0   . HYDROcodone-acetaminophen (NORCO/VICODIN) 5-325 MG per tablet   Oral   Take 1 tablet by mouth every 6 (six) hours as needed for pain.   10 tablet   0    BP 217/107  Pulse 84  Temp(Src) 98.6 F (37 C) (Oral)  Resp 18  SpO2 100%  LMP 07/16/2013 Physical Exam  Nursing note and vitals reviewed. Constitutional: She is oriented to person, place, and time. She appears well-developed and well-nourished.  HENT:  Head: Normocephalic and atraumatic.  Eyes: Pupils are equal, round, and reactive to light.  Neck: Neck supple.  Cardiovascular: Normal rate, regular rhythm and normal heart sounds.   Pulmonary/Chest: Effort normal. No respiratory distress. She has no wheezes. Right breast exhibits no mass, no nipple discharge and no skin change. Left breast exhibits mass, nipple discharge and tenderness. Left breast exhibits no skin change.    Abdominal: Soft. Bowel sounds are normal.  Neurological: She is alert and oriented to person, place, and time.  Skin: Skin is warm and dry.  Psychiatric: She has a normal mood and affect.    ED Course  Procedures (including  critical care time) Labs Review Labs Reviewed  BASIC METABOLIC PANEL - Abnormal; Notable for the following:    Potassium 3.3 (*)    Glucose, Bld 109 (*)    GFR calc non Af Amer 80 (*)    All other components within normal limits   Imaging Review No results found.  MDM   1. Lump or mass in breast   2. Hypertension     This a 47 year old female who presents with left breast mass and nipple discharge. Differential includes infection, cancer.  Patient is very anxious appearing. She does not want to know if this is cancer. Given that this could be infection, I  will get an ultrasound. Patient was also noted to be hypertensive in triage. I have asked the nurse to recheck her blood pressure. She denies any chest pain, shortness of breath, headache.  Signed out to Dr. Loretha Stapler.  Pending [place].  Checking BMP to start patient on antihypertensive    Shon Baton, MD 08/07/13 1820

## 2013-08-07 NOTE — ED Notes (Signed)
Pt states she has had a "knot" in her left lateral breast "for years."  Pt states this past Thursday she began having drainage from her nipple.  Pt states breast is "real tender," underneath it, but she denies pain.  Pt denies N/V and fever.  Pt had a mammogram in "the 80's or 90's" and it was normal.  Pt has had knot since that time.  Pt has area of hardness under left breast with some serosanguinous discharge from left nipple.

## 2013-08-07 NOTE — Discharge Instructions (Signed)
Hypertension As your heart beats, it forces blood through your arteries. This force is your blood pressure. If the pressure is too high, it is called hypertension (HTN) or high blood pressure. HTN is dangerous because you may have it and not know it. High blood pressure may mean that your heart has to work harder to pump blood. Your arteries may be narrow or stiff. The extra work puts you at risk for heart disease, stroke, and other problems.  Blood pressure consists of two numbers, a higher number over a lower, 110/72, for example. It is stated as "110 over 72." The ideal is below 120 for the top number (systolic) and under 80 for the bottom (diastolic). Write down your blood pressure today. You should pay close attention to your blood pressure if you have certain conditions such as:  Heart failure.  Prior heart attack.  Diabetes  Chronic kidney disease.  Prior stroke.  Multiple risk factors for heart disease. To see if you have HTN, your blood pressure should be measured while you are seated with your arm held at the level of the heart. It should be measured at least twice. A one-time elevated blood pressure reading (especially in the Emergency Department) does not mean that you need treatment. There may be conditions in which the blood pressure is different between your right and left arms. It is important to see your caregiver soon for a recheck. Most people have essential hypertension which means that there is not a specific cause. This type of high blood pressure may be lowered by changing lifestyle factors such as:  Stress.  Smoking.  Lack of exercise.  Excessive weight.  Drug/tobacco/alcohol use.  Eating less salt. Most people do not have symptoms from high blood pressure until it has caused damage to the body. Effective treatment can often prevent, delay or reduce that damage. TREATMENT  When a cause has been identified, treatment for high blood pressure is directed at the  cause. There are a large number of medications to treat HTN. These fall into several categories, and your caregiver will help you select the medicines that are best for you. Medications may have side effects. You should review side effects with your caregiver. If your blood pressure stays high after you have made lifestyle changes or started on medicines,   Your medication(s) may need to be changed.  Other problems may need to be addressed.  Be certain you understand your prescriptions, and know how and when to take your medicine.  Be sure to follow up with your caregiver within the time frame advised (usually within two weeks) to have your blood pressure rechecked and to review your medications.  If you are taking more than one medicine to lower your blood pressure, make sure you know how and at what times they should be taken. Taking two medicines at the same time can result in blood pressure that is too low. SEEK IMMEDIATE MEDICAL CARE IF:  You develop a severe headache, blurred or changing vision, or confusion.  You have unusual weakness or numbness, or a faint feeling.  You have severe chest or abdominal pain, vomiting, or breathing problems. MAKE SURE YOU:   Understand these instructions.  Will watch your condition.  Will get help right away if you are not doing well or get worse. Document Released: 11/10/2005 Document Revised: 02/02/2012 Document Reviewed: 06/30/2008 [facility] Patient Information 2014 [place], [place].  Breast Self-Awareness Practicing breast self-awareness may pick up problems early, prevent significant medical complications, and  possibly save your life. By practicing breast self-awareness, you can become familiar with how your breasts look and feel and if your breasts are changing. This allows you to notice changes early. It can also offer you some reassurance that your breast health is good. One way to learn what is normal for your breasts and whether your  breasts are changing is to do a breast self-exam. If you find a lump or something that was not present in the past, it is best to contact your caregiver right away. Other findings that should be evaluated by your caregiver include nipple discharge, especially if it is bloody; skin changes or reddening; areas where the skin seems to be pulled in (retracted); or new lumps and bumps. Breast pain is seldom associated with cancer (malignancy), but should also be evaluated by a caregiver. HOW TO PERFORM A BREAST SELF-EXAM The best time to examine your breasts is 5 7 days after your menstrual period is over. During menstruation, the breasts are lumpier, and it may be more difficult to pick up changes. If you do not menstruate, have reached menopause, or had your uterus removed (hysterectomy), you should examine your breasts at regular intervals, such as monthly. If you are breastfeeding, examine your breasts after a feeding or after using a breast pump. Breast implants do not decrease the risk for lumps or tumors, so continue to perform breast self-exams as recommended. Talk to your caregiver about how to determine the difference between the implant and breast tissue. Also, talk about the amount of pressure you should use during the exam. Over time, you will become more familiar with the variations of your breasts and more comfortable with the exam. A breast self-exam requires you to remove all your clothes above the waist. 1. Look at your breasts and nipples. Stand in front of a mirror in a room with good lighting. With your hands on your hips, push your hands firmly downward. Look for a difference in shape, contour, and size from one breast to the other (asymmetry). Asymmetry includes puckers, dips, or bumps. Also, look for skin changes, such as reddened or scaly areas on the breasts. Look for nipple changes, such as discharge, dimpling, repositioning, or redness. 2. Carefully feel your breasts. This is best done  either in the shower or tub while using soapy water or when flat on your back. Place the arm (on the side of the breast you are examining) above your head. Use the pads (not the fingertips) of your three middle fingers on your opposite hand to feel your breasts. Start in the underarm area and use  inch (2 cm) overlapping circles to feel your breast. Use 3 different levels of pressure (light, medium, and firm pressure) at each circle before moving to the next circle. The light pressure is needed to feel the tissue closest to the skin. The medium pressure will help to feel breast tissue a little deeper, while the firm pressure is needed to feel the tissue close to the ribs. Continue the overlapping circles, moving downward over the breast until you feel your ribs below your breast. Then, move one finger-width towards the center of the body. Continue to use the  inch (2 cm) overlapping circles to feel your breast as you move slowly up toward the collar bone (clavicle) near the base of the neck. Continue the up and down exam using all 3 pressures until you reach the middle of the chest. Do this with each breast, carefully feeling for lumps  or changes. 3.  Keep a written record with breast changes or normal findings for each breast. By writing this information down, you do not need to depend only on memory for size, tenderness, or location. Write down where you are in your menstrual cycle, if you are still menstruating. Breast tissue can have some lumps or thick tissue. However, see your caregiver if you find anything that concerns you.  SEEK MEDICAL CARE IF:  You see a change in shape, contour, or size of your breasts or nipples.   You see skin changes, such as reddened or scaly areas on the breasts or nipples.   You have an unusual discharge from your nipples.   You feel a new lump or unusually thick areas.  Document Released: 11/10/2005 Document Revised: 10/27/2012 Document Reviewed:  02/25/2012 [facility] Patient Information 2014 [place], [place].  RESOURCE GUIDE  Chronic Pain Problems: Contact [facility]  [phone] Patients need to be referred by their primary care doctor.  Insufficient Money for Medicine: Contact United Way:  call [phone]  No Primary Care Doctor: - Call Health Connect  [phone] - can help you locate a primary care doctor that  accepts your insurance, provides certain services, etc. - Physician Referral Service- [phone]  Agencies that provide inexpensive medical care: - [facility]  [phone] - [facility]  [phone] - [facility]  [phone] - Women's Clinic  [phone] - Planned Parenthood  [phone] [facility]  [phone]  Medicaid-accepting [facility] Providers: - [facility]- [address]  [phone], Mon-Fri 9am-7pm, Sat 9am-1pm - [facility]- [address] [place], [address] [place]  [phone] - [facility]- [address] [place]  [phone] [facility]- [address]  [phone] - Renaye Rakers- [address] [place], [address], [phone]  Only accepts [place] patients after they have their name  applied to their card  Self Pay (no insurance) in [place]: - Sickle Cell Patients - [facility] Internal Medicine  [address] [place], [phone] - [facility]- [address] [place]  [phone]       [facility] [place]- [address]       -     [facility]- see information above (Speak to [employer] if you do not have insurance)       -  [facility]- [address],  [phone]       -  [facility]- [address] [phone]       -  Dr Julio Sicks-  [address], [place], [phone]       -  [facility] - [address], [phone]        -  [facility]- [address], [phone], also [address] [phone]       -     [facility]- [address] [place], [phone], 1st & 3rd Saturday         every month, 10am-1pm  -     [facility]   [address] [place], [place].   Phone:  [phone], Fax:  [phone]. Hours of Operation:  9 am - 6 pm, M-F.  -     [facility]   [address], [place]   Phone: [phone], Fax: [phone]. Hours of Operation:  8:30 am - 5:30 pm, M-F.  [facility] [address] [place], [place] [address] [phone]  [facility] [address] [place], [place] [address] [phone]  1) Find a Doctor and Pay Out of Pocket Although you won't have to find out who is covered by your insurance plan, it is a good idea to ask around and get recommendations. You will then need to call the office and see if the doctor you have chosen will accept you as a new patient and what types of options they offer for patients who are self-pay. Some doctors offer discounts or will set up payment plans for their patients who do not have insurance, but you will need to ask so you aren't surprised when you get to your appointment.  2) Contact Your Local Health Department Not all health departments have doctors that can see patients for sick visits, but many do, so it is worth a call to see if yours does. If you don't know where your local health department is, you can check in your phone book. The CDC also has a tool to help you locate your state's health department, and many state websites also have listings of all of their local health departments.  3) Find a Walk-in Clinic If your illness is not likely to be very severe or complicated, you may want to try a walk in clinic. These are popping up all over the country in pharmacies, drugstores, and shopping centers. They're usually staffed by  nurse practitioners or physician assistants that have been trained to treat common illnesses and complaints. They're usually fairly quick and inexpensive. However, if you have serious medical issues or chronic medical problems, these are probably not your best option  STD Testing - [facility] of [facility], STD Clinic, [address] [place], phone [phone] or [phone].  Monday - Friday, call for an appointment. [facility] Department of [employer], STD Clinic, [address], [place], phone [phone] or [phone].  Monday - Friday, call for an appointment.  Abuse/Neglect: [facility] Child Abuse Hotline (732) 189-9487 [facility] Child Abuse Hotline 417-464-3380 (After Hours)  Emergency Shelter:  [facility] (938) 115-3945  Maternity Homes: - [facility] [phone] - [facility] (740)[phone]  MRSA Hotline #:   905-413-0393  Dental Assistance If unable to pay or uninsured, contact:  [facility]. to become qualified for the adult dental clinic.  Patients with Medicaid: [facility] [phone])332-8451 [address], [phone] [address], [phone]  If unable to pay, or uninsured, contact [facility] [phone] in [place], [phone] in [facility]) to become qualified for the adult dental clinic  [facility] [address] [place], [place] [address] [phone] [email or web address]  Other Proofreader Services: - [facility]- [address] [place], [place], [place], [address], [phone], Ext. 123, 2nd and 4th Thursday of the month at 6:30am.  10 clients each day by appointment, can sometimes see walk-in patients if someone does not show for an appointment. [facility]- [address] [place], [place], [address], [phone] - [facility]- [address] [place], [place], [address], [phone] [facility] [phone] -  [facility] Health Department- [phone] [facility] [phone]       Behavioral Health Resources in the [facility]  Intensive Outpatient Programs: [facility]      [address] [place], [place] [phone] Both a day and evening program       [facility] Outpatient     [address]        [place], [place] [address] [phone]         ADS: [facility] [address] [place] [phone]  [facility] Mental Health ACCESS LINE: 212-358-3230 or (254)[phone] [place], [place] [address] [email or web address]   Substance Abuse Resources: - [facility] Drug Services  (402)096-4946 - [facility] (772)828-7509 - [facility] [place] [phone] Floydene Flock [phone] - Residential & Outpatient Substance Abuse Program  (534)806-0018  Psychological Services: [facility]  ([phone] [facility]  (563) [phone] - [facility], (380) [phone] [place]. [address] [place], ACCESS LINE: [phone] or (212) 281-3298, [email or web address]  Mobile Crisis Teams:                                        Therapeutic Alternatives         Mobile Crisis Care Unit 3145572066             [facility] [address] Ginette Otto [phone]                                         Interventionist [address] [place] [phone]  Self-Help/Support Groups: Mental Health Assoc. of [employer] of support groups 828-662-8827 (call for more info)  Narcotics Anonymous (NA) Caring Services [address] [place] - 2 meetings at this location  Residential Treatment Programs:  [facility]      [address]        [place]       [phone]         [facility] [address], [place] [address] [place], [place]  [address] [phone]  [facility]  [address] [place], [place] [address] [phone] Admissions: 8am-3pm M-F  [facility]     [address]        [place], [place] [address]       [phone]         [facility] [address] [place], [place] [phone]  [facility] [address] [place], [place] [phone]  [facility] - Intensive Outpatient      [address]     [place], [place]       [phone]         [facility] (Addiction Recovery Care [facility]oc.)     [address] [place], [place] [phone] or [phone]  Residential Treatment Services (RTS), Medicaid [address] [place], [place] [phone]  [facility]                                               [address]  [place] [phone]  [facility] Resources: [facility]([phone]               General Therapy                                                Angie Fava, PhD        [address] [place], [place] [address]         [phone]   Insurance  [facility]al   [address] [place], [place] [address] [phone]  [facility] [address] [place], [place] [address] [phone] Insurance/Medicaid/sponsorship through [facility] and Families                                              [address]                                        [place], [place] [address]    Therapy/tele-psych/case         [phone]          [facility] [address], [place]  [address]  Adolescent/group home/case management [phone]                                           Creola Corn PhD       General  therapy       Insurance   [phone]         Dr. Lolly Mustache, Insurance, M-F [phone]  [facility]  [employer] [facility] Dept. [address]                 [address]         [address] [place] [address]  Blondell Reveal Phone:  [phone]                                  Phone:  [phone]                   Phone:  [phone]  [facility], [phone] - [address] - [phone]       -     [facility]  in [place], [address]             [phone], Insurance  [place] Child Abuse Hotline 934-471-9407 or 458-573-9596 (After Hours)

## 2013-08-07 NOTE — ED Provider Notes (Signed)
Care assumed from Dr. Wilkie Aye. 47 year old female with a breast mass. We're unable to obtain ultrasound imaging according to radiology. However patient does not have any redness surrounding lesion. She has no fevers or systemic symptoms. She is well appearing and non-toxic. Masses been present for a long time, and is unlikely to represent an abscess.  Discussed this with the patient she will followup with the breast center tomorrow for further evaluation.  She is also hypertensive, which she reports is chronic for her, even at levels this high. She does not have any signs or symptoms of endorgan damage. Will start her on Norvasc due to her mildly low potassium. She will need follow with primary care.  Clinical Impression: 1. Lump or mass in breast   2. Hypertension       Tracie Churn, MD 08/07/13 1736

## 2013-08-07 NOTE — ED Notes (Signed)
Please see triage note for nursing assessment as primary RN triaged patient.

## 2013-08-08 NOTE — ED Provider Notes (Signed)
6:34 PM Contacted by flow manager.  States that pt was seen earlier today and told to FU with breast center for further evaluation of left breast mass but no orders placed.  Asked to order diagnostic u/s left breast which i have done.  Garlon Hatchet, PA-C 08/08/13 1929

## 2013-08-09 NOTE — ED Provider Notes (Signed)
Medical screening examination/treatment/procedure(s) were performed by non-physician practitioner and as supervising physician I was immediately available for consultation/collaboration.    Gilda Crease, MD 08/09/13 [phone]

## 2013-09-24 ENCOUNTER — Emergency Department (HOSPITAL_COMMUNITY)
Admission: EM | Admit: 2013-09-24 | Discharge: 2013-09-24 | Disposition: A | Discharge status: Home / Self Care | Payer: Self-pay | Attending: Emergency Medicine | Admitting: Emergency Medicine

## 2013-09-24 ENCOUNTER — Encounter (HOSPITAL_COMMUNITY): Payer: Self-pay | Admitting: Emergency Medicine

## 2013-09-24 DIAGNOSIS — I10 Essential (primary) hypertension: Secondary | ICD-10-CM | POA: Insufficient documentation

## 2013-09-24 DIAGNOSIS — F17200 Nicotine dependence, unspecified, uncomplicated: Secondary | ICD-10-CM | POA: Insufficient documentation

## 2013-09-24 DIAGNOSIS — N63 Unspecified lump in unspecified breast: Secondary | ICD-10-CM | POA: Insufficient documentation

## 2013-09-24 LAB — CBC WITH DIFFERENTIAL/PLATELET
Eosinophils Relative: 1 % (ref 0–5)
HCT: 35.9 % — ABNORMAL LOW (ref 36.0–46.0)
Lymphocytes Relative: 21 % (ref 12–46)
Lymphs Abs: 2.8 10*3/uL (ref 0.7–4.0)
MCV: 82.7 fL (ref 78.0–100.0)
Platelets: 327 10*3/uL (ref 150–400)
RBC: 4.34 MIL/uL (ref 3.87–5.11)
WBC: 12.9 10*3/uL — ABNORMAL HIGH (ref 4.0–10.5)

## 2013-09-24 LAB — BASIC METABOLIC PANEL
CO2: 26 mEq/L (ref 19–32)
Calcium: 9.2 mg/dL (ref 8.4–10.5)
Chloride: 101 mEq/L (ref 96–112)
Glucose, Bld: 103 mg/dL — ABNORMAL HIGH (ref 70–99)
Sodium: 136 mEq/L (ref 135–145)

## 2013-09-24 MED ORDER — ONDANSETRON HCL 4 MG/2ML IJ SOLN
4.0000 mg | Freq: Once | INTRAMUSCULAR | Status: AC
  Administered 2013-09-24: 4 mg via INTRAVENOUS
  Filled 2013-09-24: qty 2

## 2013-09-24 MED ORDER — CLINDAMYCIN HCL 150 MG PO CAPS: 300.0000 mg | ORAL_CAPSULE | Freq: Three times a day (TID) | ORAL | Status: DC

## 2013-09-24 MED ORDER — HYDROCHLOROTHIAZIDE 25 MG PO TABS: 25.0000 mg | ORAL_TABLET | Freq: Every day | ORAL | Status: DC

## 2013-09-24 MED ORDER — MORPHINE SULFATE 4 MG/ML IJ SOLN
4.0000 mg | Freq: Once | INTRAMUSCULAR | Status: AC
  Administered 2013-09-24: 4 mg via INTRAVENOUS
  Filled 2013-09-24: qty 1

## 2013-09-24 MED ORDER — LISINOPRIL 10 MG PO TABS
10.0000 mg | ORAL_TABLET | Freq: Once | ORAL | Status: AC
  Administered 2013-09-24: 10 mg via ORAL
  Filled 2013-09-24: qty 1

## 2013-09-24 MED ORDER — OXYCODONE-ACETAMINOPHEN 5-325 MG PO TABS: 1.0000 | ORAL_TABLET | ORAL | Status: DC | PRN

## 2013-09-24 MED ORDER — LISINOPRIL 20 MG PO TABS: 10.0000 mg | ORAL_TABLET | Freq: Every day | ORAL | Status: DC

## 2013-09-24 MED ORDER — HYDROCHLOROTHIAZIDE 25 MG PO TABS
25.0000 mg | ORAL_TABLET | Freq: Once | ORAL | Status: AC
  Administered 2013-09-24: 25 mg via ORAL
  Filled 2013-09-24: qty 1

## 2013-09-24 MED ORDER — CLINDAMYCIN PHOSPHATE 600 MG/50ML IV SOLN
600.0000 mg | Freq: Once | INTRAVENOUS | Status: AC
  Administered 2013-09-24: 600 mg via INTRAVENOUS
  Filled 2013-09-24: qty 50

## 2013-09-24 NOTE — ED Notes (Signed)
Placed ABD pad on left breast to catch leaking fluid. It appears to be blood and clear fluid coming from the center of the nipple. Area around the areola is red, swollen and warm to the touch.

## 2013-09-24 NOTE — ED Provider Notes (Signed)
CSN: [id number]     Arrival date & time 09/24/13  1857 History   First MD Initiated Contact with Patient 09/24/13 2007     Chief Complaint  Patient presents with  . Breast Pain   (Consider location/radiation/quality/duration/timing/severity/associated sxs/prior Treatment) HPI History provided by pt and prior chart.  Per prior chart, pt presented to ED on 08/07/13 w/ new onset L breast pain.  Has had a mass in that location for several years but evaluated w/ mammogram in remote past and normal.  At most recent visit, malignancy suspected and pt d/c'd home w/ referral to the [facility].  Pt returns today because her pain has worsened, edema increased, and there is bleeding from her newly inverted nipple.  Sx relieved by pressing on breast to excrete fluid from nipple.  Denies f/c, body aches, generalized weakness or other associated sx.  FH breast cancer.  Was unable to follow up with breast center because uninsured and unable to afford.   Past Medical History  Diagnosis Date  . Hypertension    History reviewed. No pertinent past surgical history. No family history on file. History  Substance Use Topics  . Smoking status: Current Every Day Smoker -- 1.00 packs/day    Types: Cigarettes  . Smokeless tobacco: Never Used  . Alcohol Use: No   OB History   Grav Para Term Preterm Abortions TAB SAB Ect Mult Living                 Review of Systems  HENT:       Has not had severe headaches  Eyes: Negative for visual disturbance.  Respiratory: Negative for shortness of breath.   Cardiovascular: Negative for chest pain and leg swelling.  All other systems reviewed and are negative.    Allergies  Fish allergy; Peanuts; and Other  Home Medications   Current Outpatient Rx  Name  Route  Sig  Dispense  Refill  . ibuprofen (ADVIL,MOTRIN) 200 MG tablet   Oral   Take 400 mg by mouth every 6 (six) hours as needed for pain.           BP 223/104  Pulse 95  Temp(Src) 99.1 F (37.3 C)  (Oral)  Resp 16  Wt 181 lb 4 oz (82.214 kg)  BMI 36.59 kg/m2  SpO2 100%  LMP 08/24/2013 Physical Exam  Nursing note and vitals reviewed. Constitutional: She is oriented to person, place, and time. She appears well-developed and well-nourished. No distress.  HENT:  Head: Normocephalic and atraumatic.  Eyes:  Normal appearance  Neck: Normal range of motion.  Cardiovascular: Normal rate and regular rhythm.   Hypertensive   Pulmonary/Chest: Effort normal and breath sounds normal. No respiratory distress.  Breasts mildly asymmetric; L larger than right.  L nipple indurated and there is a small amt of bloody serous fluid draining from center.  L lower outer quadrant erythematous, induration and severely ttp.  No other skin changes.  No well defined breast mass.  No axillary or supraclavicular lymphadenopathy.   Musculoskeletal: Normal range of motion.  Neurological: She is alert and oriented to person, place, and time.  Skin: Skin is warm and dry. No rash noted.  Psychiatric: She has a normal mood and affect. Her behavior is normal.    ED Course  Procedures (including critical care time) Labs Review Labs Reviewed  CBC WITH DIFFERENTIAL - Abnormal; Notable for the following:    WBC 12.9 (*)    HCT 35.9 (*)    Neutro  Abs 9.2 (*)    All other components within normal limits  BASIC METABOLIC PANEL - Abnormal; Notable for the following:    Potassium 3.2 (*)    Glucose, Bld 103 (*)    GFR calc non Af Amer 75 (*)    GFR calc Af Amer 87 (*)    All other components within normal limits   Imaging Review No results found.  EKG Interpretation   None       MDM   1. Breast mass   2. Hypertension    47yo F presents for the second time in 6 weeks w/ L breast pain.  Exam concerning for abscess vs. Malignancy, though malignancy more likely based on course of sx and lack of associated infectious sx.  Patient can not afford to go to [facility] for imaging.  Discussed case w/ Dr. Corliss Skains  who recommended that she contact both the Cancer Center and [facility] on Monday morning.  He also recommended the B[facility]CP program for uninsured patients.  Pt received a dose of IV clindamycin in ED and will d/c home w/ same in case of infection.  She is very hypertensive here in ED.  H/o HTN and has been off of meds for years.  Prescribed Norvasc in ED in September but can't afford.  Treated w/ lisinopril and hctz in ED, BP improved mildly to 205/104.  Pt and her daughter say that these numbers are typical and no s/sx end organ damage.  She is in process of finding a PCP and understands that she will need to have BP and BUN/Cr rechecked in order to have lisinopril refilled.  Importance of blood pressure control discussed.  Prescribed percocet for pain.  She is aware that [facility] has an ER.      Otilio Miu, PA-C 09/24/13 [phone]

## 2013-09-24 NOTE — ED Notes (Signed)
Schinlever PA at bedside. 

## 2013-09-24 NOTE — ED Notes (Signed)
The pt has had a lump in her breast since 2004.  She was seen at [facility] long ed 3 weeks ago and she was scheduled for a [place] of her breast but they would not do the etst because she did not have the money or insurance.  She has had bleeding from the lt nipple for awhile.  She has not had her bp meds s for 6 months because she has not had the money or onsurance

## 2013-09-24 NOTE — ED Notes (Signed)
Pt states that in 2004 she had to have a mammogram and it came back negative. Pt states that for a couple of weeks her breast has been tender, had drainage, swelling and redness. Pt states that a few weeks ago she felt something pop and blood was coming out of her breast. Pt went to [facility] after and they referred her to the breast center. Pt states they turned her down because she didn't have insurance.

## 2013-09-24 NOTE — ED Notes (Signed)
Schinlever PA at bedside.

## 2013-09-25 NOTE — ED Provider Notes (Signed)
Medical screening examination/treatment/procedure(s) were performed by non-physician practitioner and as supervising physician I was immediately available for consultation/collaboration.  EKG Interpretation   None         Shanna Cisco, MD 09/25/13 1048

## 2013-09-26 ENCOUNTER — Other Ambulatory Visit (HOSPITAL_COMMUNITY): Payer: Self-pay | Admitting: *Deleted

## 2013-09-26 DIAGNOSIS — N6320 Unspecified lump in the left breast, unspecified quadrant: Secondary | ICD-10-CM

## 2013-09-26 DIAGNOSIS — N6452 Nipple discharge: Secondary | ICD-10-CM

## 2013-09-27 ENCOUNTER — Other Ambulatory Visit (HOSPITAL_COMMUNITY): Payer: Self-pay | Admitting: Obstetrics and Gynecology

## 2013-09-27 ENCOUNTER — Ambulatory Visit (HOSPITAL_COMMUNITY)
Admission: RE | Admit: 2013-09-27 | Discharge: 2013-09-27 | Disposition: A | Discharge status: Home / Self Care | Payer: Self-pay | Source: Ambulatory Visit | Attending: Obstetrics and Gynecology | Admitting: Obstetrics and Gynecology

## 2013-09-27 ENCOUNTER — Encounter (HOSPITAL_COMMUNITY): Payer: Self-pay

## 2013-09-27 ENCOUNTER — Ambulatory Visit
Admission: RE | Admit: 2013-09-27 | Discharge: 2013-09-27 | Disposition: A | Discharge status: Home / Self Care | Payer: No Typology Code available for payment source | Source: Ambulatory Visit | Attending: Obstetrics and Gynecology | Admitting: Obstetrics and Gynecology

## 2013-09-27 ENCOUNTER — Encounter (INDEPENDENT_AMBULATORY_CARE_PROVIDER_SITE_OTHER): Payer: Self-pay

## 2013-09-27 ENCOUNTER — Other Ambulatory Visit (HOSPITAL_COMMUNITY)
Admission: RE | Admit: 2013-09-27 | Discharge: 2013-09-27 | Disposition: A | Discharge status: Home / Self Care | Payer: No Typology Code available for payment source | Source: Ambulatory Visit | Attending: Obstetrics and Gynecology | Admitting: Obstetrics and Gynecology

## 2013-09-27 VITALS — BP 220/110 | Temp 99.0°F | Ht 59.0 in | Wt 180.8 lb

## 2013-09-27 DIAGNOSIS — N6320 Unspecified lump in the left breast, unspecified quadrant: Secondary | ICD-10-CM

## 2013-09-27 DIAGNOSIS — Z1239 Encounter for other screening for malignant neoplasm of breast: Secondary | ICD-10-CM

## 2013-09-27 DIAGNOSIS — N6452 Nipple discharge: Secondary | ICD-10-CM

## 2013-09-27 DIAGNOSIS — N6459 Other signs and symptoms in breast: Secondary | ICD-10-CM | POA: Insufficient documentation

## 2013-09-27 NOTE — Patient Instructions (Signed)
Taught CHAE SHUSTER how to perform BSE and gave educational materials to take home. Unable to do patient's Pap smear today due to on menstrual cycle and heavy. Have rescheduled patient's Pap smear for Tuesday, November 08, 2013 at [facility]. Referred patient to the [facility] for diagnostic mammogram and left breast ultrasound. Appointment scheduled for today, September 27, 2013 at 1515. Patient aware of appointment and will be there. Susa Griffins verbalized understanding.  Brannock, Kathaleen Maser, RN 12:52 PM

## 2013-09-27 NOTE — Progress Notes (Signed)
Complaints of left breast lump x 2-3 weeks, left breast increase of size, left nipple inversion x 3 days, left nipple spontaneous bloody nipple discharge and left breast pain that comes and goes. Patient rated pain at a 6-7 out of 10.  Pap Smear:    Pap smear not completed today since patient on menstrual period and heavy. Last Pap smear was 4 years ago at Dr. Elsie Stain office and normal per patient. Per patient has no history of an abnormal Pap smear. Patient scheduled for a Pap smear at [facility] clinic on Tuesday, November 08, 2013. No Pap smear results in EPIC.  Physical exam: Breasts Left breast larger than right breast. No skin abnormalities right breast. Left lower breast red with the orange peel appearance. No nipple retraction right breast. Left breast nipple retraction per patient first noticed 3 days ago. No nipple discharge right breast. Bloody spontaneous left nipple discharge. Sample of left breast discharge sent to cytology. No lymphadenopathy. No lumps palpated right breast. Palpated a 10 cm x 12 cm left outer lower quadrant breast mass. Complaints of tenderness when palpated left lower breast on exam. Referred patient to the [facility] of [facility] for diagnostic mammogram and left breast ultrasound. Appointment scheduled for today, September 27, 2013 at 1515.        Pelvic/Bimanual No Pap smear completed today since patient was on menstrual cycle and heavy. Pap smear scheduled for Tuesday, November 08, 2013 at [facility]

## 2013-09-30 ENCOUNTER — Ambulatory Visit
Admission: RE | Admit: 2013-09-30 | Discharge: 2013-09-30 | Disposition: A | Discharge status: Home / Self Care | Payer: No Typology Code available for payment source | Source: Ambulatory Visit | Attending: Obstetrics and Gynecology | Admitting: Obstetrics and Gynecology

## 2013-09-30 DIAGNOSIS — N6452 Nipple discharge: Secondary | ICD-10-CM

## 2013-09-30 DIAGNOSIS — N6320 Unspecified lump in the left breast, unspecified quadrant: Secondary | ICD-10-CM

## 2013-10-08 ENCOUNTER — Encounter (HOSPITAL_COMMUNITY): Payer: Self-pay | Admitting: Emergency Medicine

## 2013-10-08 ENCOUNTER — Emergency Department (HOSPITAL_COMMUNITY)
Admission: EM | Admit: 2013-10-08 | Discharge: 2013-10-08 | Disposition: A | Discharge status: Home / Self Care | Payer: Self-pay | Attending: Emergency Medicine | Admitting: Emergency Medicine

## 2013-10-08 DIAGNOSIS — I10 Essential (primary) hypertension: Secondary | ICD-10-CM | POA: Insufficient documentation

## 2013-10-08 DIAGNOSIS — N6489 Other specified disorders of breast: Secondary | ICD-10-CM | POA: Insufficient documentation

## 2013-10-08 DIAGNOSIS — F17200 Nicotine dependence, unspecified, uncomplicated: Secondary | ICD-10-CM | POA: Insufficient documentation

## 2013-10-08 DIAGNOSIS — Z792 Long term (current) use of antibiotics: Secondary | ICD-10-CM | POA: Insufficient documentation

## 2013-10-08 DIAGNOSIS — D242 Benign neoplasm of left breast: Secondary | ICD-10-CM

## 2013-10-08 DIAGNOSIS — N644 Mastodynia: Secondary | ICD-10-CM | POA: Insufficient documentation

## 2013-10-08 DIAGNOSIS — Z79899 Other long term (current) drug therapy: Secondary | ICD-10-CM | POA: Insufficient documentation

## 2013-10-08 DIAGNOSIS — N6459 Other signs and symptoms in breast: Secondary | ICD-10-CM | POA: Insufficient documentation

## 2013-10-08 DIAGNOSIS — D249 Benign neoplasm of unspecified breast: Secondary | ICD-10-CM | POA: Insufficient documentation

## 2013-10-08 LAB — BASIC METABOLIC PANEL
BUN: 8 mg/dL (ref 6–23)
CO2: 27 mEq/L (ref 19–32)
Calcium: 9.2 mg/dL (ref 8.4–10.5)
Chloride: 98 mEq/L (ref 96–112)
Creatinine, Ser: 0.65 mg/dL (ref 0.50–1.10)
GFR calc non Af Amer: >90 mL/min (ref >90)

## 2013-10-08 LAB — CBC WITH DIFFERENTIAL/PLATELET
Basophils Absolute: 0 10*3/uL (ref 0.0–0.1)
Basophils Relative: 0 % (ref 0–1)
Eosinophils Relative: 1 % (ref 0–5)
HCT: 34.9 % — ABNORMAL LOW (ref 36.0–46.0)
Lymphocytes Relative: 15 % (ref 12–46)
MCH: 28.4 pg (ref 26.0–34.0)
MCHC: 34.7 g/dL (ref 30.0–36.0)
MCV: 81.9 fL (ref 78.0–100.0)
Monocytes Absolute: 1.4 10*3/uL — ABNORMAL HIGH (ref 0.1–1.0)
Platelets: 464 10*3/uL — ABNORMAL HIGH (ref 150–400)
RDW: 14.3 % (ref 11.5–15.5)

## 2013-10-08 LAB — TROPONIN I: Troponin I: <0.3 ng/mL (ref <0.30)

## 2013-10-08 MED ORDER — HYDROMORPHONE HCL PF 1 MG/ML IJ SOLN
1.0000 mg | Freq: Once | INTRAMUSCULAR | Status: AC
  Administered 2013-10-08: 1 mg via INTRAVENOUS
  Filled 2013-10-08: qty 1

## 2013-10-08 MED ORDER — HYDROCODONE-ACETAMINOPHEN 5-325 MG PO TABS
1.0000 | ORAL_TABLET | Freq: Once | ORAL | Status: AC
  Administered 2013-10-08: 1 via ORAL
  Filled 2013-10-08: qty 1

## 2013-10-08 MED ORDER — HYDROCODONE-ACETAMINOPHEN 5-325 MG PO TABS: 1.0000 | ORAL_TABLET | ORAL | Status: DC | PRN

## 2013-10-08 MED ORDER — LABETALOL HCL 5 MG/ML IV SOLN
20.0000 mg | Freq: Once | INTRAVENOUS | Status: AC
  Administered 2013-10-08: 20 mg via INTRAVENOUS
  Filled 2013-10-08: qty 4

## 2013-10-08 NOTE — ED Provider Notes (Signed)
CSN: [id number]     Arrival date & time 10/08/13  2002 History   First MD Initiated Contact with Patient 10/08/13 2034     Chief Complaint  Patient presents with  . Breast Pain  . Recurrent Skin Infections   (Consider location/radiation/quality/duration/timing/severity/associated sxs/prior Treatment) The history is provided by the patient. No language interpreter was used.   Patient is a 47 year old African American female with past medical history of hypertension comes emergency department today with left breast pain. She's been evaluated multiple times in emergency departments for this complaint over the past week or two.  Her last visit to the emergency department she is followed up with the [facility] provider and has obtained a ultrasound of the left breast and a biopsy for suspicious mass. 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 She is scheduled next week to begin treatment.  She has been unable to afford the pain medication that she was prescribed as results comes emergency department today with continued pain. She states that the pain is no worse than it has been in the past.  She has got to the point that she was unable to tolerate it anymore. He has not had any fevers or chills at home. She reports no worsening of the pain.  She denies headache, chest pain, shortness of breath, weakness, or numbness.  She rates the pain at a 10/10.    Past Medical History  Diagnosis Date  . Hypertension    History reviewed. No pertinent past surgical history. Family History  Problem Relation Age of Onset  . Breast cancer Mother   . Cancer Mother     brain  . Hypertension Father    History  Substance Use Topics  . Smoking status: Current Every Day Smoker -- 1.00 packs/day for 26 years    Types: Cigarettes  . Smokeless tobacco: Never Used  . Alcohol Use: No   OB History   Grav Para Term Preterm Abortions TAB SAB Ect Mult Living   1 1 1       1      Review of Systems   Constitutional: Negative for fever and chills.  Respiratory: Negative for cough, chest tightness and shortness of breath.   Cardiovascular: Negative for chest pain.  Gastrointestinal: Negative for nausea, vomiting, diarrhea and constipation.  Genitourinary: Negative for dysuria, urgency and frequency.  Musculoskeletal: Negative for back pain, neck pain and neck stiffness.  Neurological: Negative for weakness, numbness and headaches.  All other systems reviewed and are negative.    Allergies  Fish allergy; Peanuts; and Other  Home Medications   Current Outpatient Rx  Name  Route  Sig  Dispense  Refill  . acetaminophen (TYLENOL) 500 MG tablet   Oral   Take 1,000 mg by mouth every 6 (six) hours as needed for moderate pain.          Marland Kitchen ibuprofen (ADVIL,MOTRIN) 200 MG tablet   Oral   Take 400 mg by mouth every 6 (six) hours as needed.         . clindamycin (CLEOCIN) 150 MG capsule   Oral   Take 2 capsules (300 mg total) by mouth 3 (three) times daily.   60 capsule   0   . hydrochlorothiazide (HYDRODIURIL) 25 MG tablet   Oral   Take 1 tablet (25 mg total) by mouth daily.   30 tablet   0   . HYDROcodone-acetaminophen (NORCO/VICODIN) 5-325 MG per tablet   Oral   Take 1 tablet by mouth  every 4 (four) hours as needed for moderate pain.   30 tablet   0   . ibuprofen (ADVIL,MOTRIN) 200 MG tablet   Oral   Take 400 mg by mouth every 6 (six) hours as needed for pain.          Marland Kitchen lisinopril (PRINIVIL,ZESTRIL) 20 MG tablet   Oral   Take 0.5 tablets (10 mg total) by mouth daily.   30 tablet   0   . oxyCODONE-acetaminophen (PERCOCET/ROXICET) 5-325 MG per tablet   Oral   Take 1 tablet by mouth every 4 (four) hours as needed for pain.   20 tablet   0    BP 165/88  Pulse 89  Temp(Src) 99.3 F (37.4 C) (Oral)  Resp 24  Wt 181 lb 3.2 oz (82.192 kg)  SpO2 96%  LMP 08/24/2013 Physical Exam  Nursing note and vitals reviewed. Constitutional: She is oriented to person,  place, and time. She appears well-developed and well-nourished. No distress.  HENT:  Head: Normocephalic and atraumatic.  Eyes: Pupils are equal, round, and reactive to light.  Neck: Normal range of motion.  Cardiovascular: Normal rate, regular rhythm, normal heart sounds and intact distal pulses.   Pulmonary/Chest: Effort normal. No respiratory distress. She has no wheezes. She exhibits no tenderness. Right breast exhibits no inverted nipple, no mass, no nipple discharge, no skin change and no tenderness. Left breast exhibits inverted nipple, mass, nipple discharge, skin change and tenderness. Breasts are asymmetrical.  Erythema and pain to the left breast.  Palpable hard tender mass.  No fluctuance.    Abdominal: Soft. Bowel sounds are normal. She exhibits no distension. There is no tenderness. There is no rebound and no guarding.  Neurological: She is alert and oriented to person, place, and time. She has normal strength. No cranial nerve deficit or sensory deficit. She exhibits normal muscle tone. Coordination and gait normal.  Skin: Skin is warm and dry.    ED Course  Procedures (including critical care time) Labs Review Labs Reviewed  CBC WITH DIFFERENTIAL - Abnormal; Notable for the following:    WBC 16.1 (*)    HCT 34.9 (*)    Platelets 464 (*)    Neutro Abs 12.2 (*)    Monocytes Absolute 1.4 (*)    All other components within normal limits  BASIC METABOLIC PANEL - Abnormal; Notable for the following:    Sodium 134 (*)    Potassium 3.1 (*)    All other components within normal limits  TROPONIN I   Imaging Review No results found.  EKG Interpretation   None       MDM  Patient is a 47 year old African American female with past medical history of hypertension comes emergency department today with left breast pain. Physical exam as above.  With no fevers, no chills, and no change in pain since her last visit doubt a breast abscess or cellulitis. This is likely her  intraductal papilloma. Because of significant hypertension patient was felt to require admission for hypertensive emergency. Initial workup included a CBC, BMP, and troponin.  Patient's pain was treated with a total of 2 mg of Dilaudid in the emergency department her blood pressure was treated with 20 mg of labetalol initially. Labetalol brought her blood pressure down to 220/100. After her pain was controlled her blood pressure was 160/80. CBC had a WBC of 16.1. BMP was unremarkable with a creatinine of 0.65. Troponin was negative. With no headache she was not felt to require CT  of her head.  With blood pressure improving with adequate pain control, normal BMP, negative troponin, no chest pain, and no headaches doubt hypertensive emergency. Her hypertension is felt to be secondary to her pain. Patient's pain was controlled she requested to go home. She was felt to be stable for discharge. She was provided with a prescription for Norco instructed to fill this since it is cheaper than Percocet. Patient expressed understanding. She was instructed to return to the emergency department if she develops worsening pain. She was instructed to followup with her surgeons on Monday as previously scheduled. Patient expressed understanding. She was discharged in stable condition. Labs reviewed by myself and considered and medical decision-making. Care was discussed my attending Dr. Gwendolyn Grant.    1. Breast pain, left   2. Intraductal papilloma of breast, left     Bethann Berkshire, MD 10/08/13 [phone]

## 2013-10-08 NOTE — ED Notes (Signed)
Transporting patient to ED exit.

## 2013-10-08 NOTE — ED Notes (Signed)
Pt states she was seen a week ago and then [facility] and had [facility]. Pt states that her left breast has been swollen and red, pt was diagnosed through the [facility] but does not remember the name of her diagnoses nor can it be found on paperwork. Pt states she has not been able to pay for her medications (including BP meds or antibiotics that were rxed) pt having burning to left nipple and breast.

## 2013-10-09 NOTE — ED Provider Notes (Signed)
I saw and evaluated the patient, reviewed the resident's note and I agree with the findings and plan.  EKG Interpretation   None        Date: 10/09/2013  Rate: 102  Rhythm: sinus tachycardia  QRS Axis: normal  Intervals: normal  ST/T Wave abnormalities: normal  Conduction Disutrbances:none  Narrative Interpretation:   Old EKG Reviewed: unchanged  47 year old female with recent diagnosis of breast cancer presents with rest pain. She's also markedly hypertensive in the 260s over 160s. Patient has not been taking her blood pressure medicines. She does have chronic hypertension and was released from the hospital with blood pressure hovering around 200 and a systolic. Patient with pain. Denies any fever, shortness of breath, nausea, vomiting, diarrhea. Here exam with some left breast swelling and tenderness. Patient has relief with pain medicines. Labs were sent due to her marked hypertension, however her high blood pressure resolved and was in the 180s in the systolic prior to discharge. She so much better and is following up with in an outside hospital Monday for her breast  Dagmar Hait, MD 10/09/13 [phone]

## 2013-10-10 ENCOUNTER — Encounter (INDEPENDENT_AMBULATORY_CARE_PROVIDER_SITE_OTHER): Payer: Self-pay | Admitting: Surgery

## 2013-10-10 ENCOUNTER — Ambulatory Visit (INDEPENDENT_AMBULATORY_CARE_PROVIDER_SITE_OTHER): Payer: Self-pay | Admitting: Surgery

## 2013-10-10 ENCOUNTER — Inpatient Hospital Stay (HOSPITAL_COMMUNITY)
Admission: AD | Admit: 2013-10-10 | Discharge: 2013-10-17 | DRG: 600 | Disposition: A | Discharge status: Home Health | Payer: Self-pay | Source: Ambulatory Visit | Attending: General Surgery | Admitting: General Surgery

## 2013-10-10 ENCOUNTER — Observation Stay (HOSPITAL_COMMUNITY): Payer: Self-pay

## 2013-10-10 ENCOUNTER — Encounter (HOSPITAL_COMMUNITY): Payer: Self-pay

## 2013-10-10 VITALS — BP 192/88 | HR 92 | Resp 16 | Ht 59.0 in | Wt 180.4 lb

## 2013-10-10 DIAGNOSIS — F411 Generalized anxiety disorder: Secondary | ICD-10-CM

## 2013-10-10 DIAGNOSIS — E876 Hypokalemia: Secondary | ICD-10-CM | POA: Diagnosis not present

## 2013-10-10 DIAGNOSIS — A4901 Methicillin susceptible Staphylococcus aureus infection, unspecified site: Secondary | ICD-10-CM | POA: Diagnosis present

## 2013-10-10 DIAGNOSIS — N6452 Nipple discharge: Secondary | ICD-10-CM

## 2013-10-10 DIAGNOSIS — D249 Benign neoplasm of unspecified breast: Secondary | ICD-10-CM | POA: Diagnosis present

## 2013-10-10 DIAGNOSIS — N61 Mastitis without abscess: Secondary | ICD-10-CM

## 2013-10-10 DIAGNOSIS — Z9119 Patient's noncompliance with other medical treatment and regimen: Secondary | ICD-10-CM

## 2013-10-10 DIAGNOSIS — D72829 Elevated white blood cell count, unspecified: Secondary | ICD-10-CM | POA: Diagnosis present

## 2013-10-10 DIAGNOSIS — F17200 Nicotine dependence, unspecified, uncomplicated: Secondary | ICD-10-CM | POA: Diagnosis present

## 2013-10-10 DIAGNOSIS — Z113 Encounter for screening for infections with a predominantly sexual mode of transmission: Secondary | ICD-10-CM

## 2013-10-10 DIAGNOSIS — I10 Essential (primary) hypertension: Secondary | ICD-10-CM | POA: Diagnosis present

## 2013-10-10 DIAGNOSIS — N179 Acute kidney failure, unspecified: Secondary | ICD-10-CM | POA: Diagnosis not present

## 2013-10-10 DIAGNOSIS — N644 Mastodynia: Secondary | ICD-10-CM | POA: Diagnosis present

## 2013-10-10 DIAGNOSIS — N611 Abscess of the breast and nipple: Secondary | ICD-10-CM

## 2013-10-10 DIAGNOSIS — Z803 Family history of malignant neoplasm of breast: Secondary | ICD-10-CM

## 2013-10-10 DIAGNOSIS — Z91199 Patient's noncompliance with other medical treatment and regimen due to unspecified reason: Secondary | ICD-10-CM

## 2013-10-10 DIAGNOSIS — Z22322 Carrier or suspected carrier of Methicillin resistant Staphylococcus aureus: Secondary | ICD-10-CM

## 2013-10-10 DIAGNOSIS — Z8249 Family history of ischemic heart disease and other diseases of the circulatory system: Secondary | ICD-10-CM

## 2013-10-10 DIAGNOSIS — N63 Unspecified lump in unspecified breast: Secondary | ICD-10-CM | POA: Diagnosis present

## 2013-10-10 DIAGNOSIS — T502X5A Adverse effect of carbonic-anhydrase inhibitors, benzothiadiazides and other diuretics, initial encounter: Secondary | ICD-10-CM | POA: Diagnosis not present

## 2013-10-10 DIAGNOSIS — N6320 Unspecified lump in the left breast, unspecified quadrant: Secondary | ICD-10-CM

## 2013-10-10 DIAGNOSIS — T368X5A Adverse effect of other systemic antibiotics, initial encounter: Secondary | ICD-10-CM | POA: Diagnosis not present

## 2013-10-10 HISTORY — DX: Acute kidney failure, unspecified: N17.9

## 2013-10-10 HISTORY — DX: Essential (primary) hypertension: I10

## 2013-10-10 HISTORY — DX: Abscess of the breast and nipple: N61.1

## 2013-10-10 LAB — CBC
MCH: 28 pg (ref 26.0–34.0)
MCV: 81.6 fL (ref 78.0–100.0)
Platelets: 445 10*3/uL — ABNORMAL HIGH (ref 150–400)
RDW: 14.5 % (ref 11.5–15.5)

## 2013-10-10 LAB — MRSA PCR SCREENING: MRSA by PCR: NEGATIVE

## 2013-10-10 MED ORDER — DEXTROSE IN LACTATED RINGERS 5 % IV SOLN
INTRAVENOUS | Status: DC
  Administered 2013-10-10 – 2013-10-11 (×2): via INTRAVENOUS

## 2013-10-10 MED ORDER — ACETAMINOPHEN 325 MG PO TABS
650.0000 mg | ORAL_TABLET | Freq: Four times a day (QID) | ORAL | Status: DC | PRN
  Administered 2013-10-10: 650 mg via ORAL
  Filled 2013-10-10: qty 2

## 2013-10-10 MED ORDER — HYDROCHLOROTHIAZIDE 25 MG PO TABS
25.0000 mg | ORAL_TABLET | Freq: Every day | ORAL | Status: DC
  Administered 2013-10-10 – 2013-10-12 (×3): 25 mg via ORAL
  Filled 2013-10-10 (×4): qty 1

## 2013-10-10 MED ORDER — HYDROMORPHONE HCL PF 1 MG/ML IJ SOLN
1.0000 mg | INTRAMUSCULAR | Status: DC | PRN
  Administered 2013-10-10 – 2013-10-12 (×5): 1 mg via INTRAVENOUS
  Filled 2013-10-10 (×5): qty 1

## 2013-10-10 MED ORDER — ONDANSETRON HCL 4 MG/2ML IJ SOLN
INTRAMUSCULAR | Status: AC
  Filled 2013-10-10: qty 2

## 2013-10-10 MED ORDER — ENOXAPARIN SODIUM 40 MG/0.4ML ~~LOC~~ SOLN
40.0000 mg | SUBCUTANEOUS | Status: DC
  Administered 2013-10-10 – 2013-10-16 (×7): 40 mg via SUBCUTANEOUS
  Filled 2013-10-10 (×8): qty 0.4

## 2013-10-10 MED ORDER — ONDANSETRON HCL 4 MG/2ML IJ SOLN
4.0000 mg | INTRAMUSCULAR | Status: DC | PRN
  Administered 2013-10-11 – 2013-10-13 (×5): 4 mg via INTRAVENOUS
  Filled 2013-10-10 (×5): qty 2

## 2013-10-10 MED ORDER — ACETAMINOPHEN 650 MG RE SUPP: 650.0000 mg | Freq: Four times a day (QID) | RECTAL | Status: DC | PRN

## 2013-10-10 MED ORDER — LABETALOL HCL 5 MG/ML IV SOLN
20.0000 mg | INTRAVENOUS | Status: DC | PRN
  Administered 2013-10-10 – 2013-10-14 (×6): 20 mg via INTRAVENOUS
  Administered 2013-10-15: 5 mg via INTRAVENOUS
  Filled 2013-10-10 (×8): qty 4

## 2013-10-10 MED ORDER — VANCOMYCIN HCL IN DEXTROSE 1-5 GM/200ML-% IV SOLN
1000.0000 mg | Freq: Three times a day (TID) | INTRAVENOUS | Status: DC
  Administered 2013-10-11 – 2013-10-12 (×5): 1000 mg via INTRAVENOUS
  Filled 2013-10-10 (×5): qty 200

## 2013-10-10 MED ORDER — OXYCODONE HCL 5 MG PO TABS
5.0000 mg | ORAL_TABLET | ORAL | Status: DC | PRN
  Administered 2013-10-10 – 2013-10-17 (×8): 5 mg via ORAL
  Filled 2013-10-10 (×10): qty 1

## 2013-10-10 MED ORDER — PIPERACILLIN-TAZOBACTAM 3.375 G IVPB
3.3750 g | Freq: Three times a day (TID) | INTRAVENOUS | Status: DC
  Administered 2013-10-10 – 2013-10-15 (×15): 3.375 g via INTRAVENOUS
  Filled 2013-10-10 (×18): qty 50

## 2013-10-10 MED ORDER — CEFAZOLIN SODIUM-DEXTROSE 2-3 GM-% IV SOLR
2.0000 g | Freq: Three times a day (TID) | INTRAVENOUS | Status: DC
  Filled 2013-10-10 (×2): qty 50

## 2013-10-10 MED ORDER — OXYCODONE-ACETAMINOPHEN 5-325 MG PO TABS
1.0000 | ORAL_TABLET | ORAL | Status: DC | PRN
  Administered 2013-10-10 – 2013-10-16 (×3): 1 via ORAL
  Filled 2013-10-10 (×4): qty 1

## 2013-10-10 MED ORDER — LISINOPRIL 10 MG PO TABS
10.0000 mg | ORAL_TABLET | Freq: Every day | ORAL | Status: DC
  Administered 2013-10-10 – 2013-10-12 (×3): 10 mg via ORAL
  Filled 2013-10-10 (×3): qty 1

## 2013-10-10 MED ORDER — VANCOMYCIN HCL 10 G IV SOLR
1500.0000 mg | Freq: Once | INTRAVENOUS | Status: AC
  Administered 2013-10-10: 1500 mg via INTRAVENOUS
  Filled 2013-10-10: qty 1500

## 2013-10-10 NOTE — Progress Notes (Signed)
Blood pressure upon admission was 219/119.  Administered ordered BP medications.  BP remained high.  Administered 20 mg PRN labetalol at 1833.  BP remained elevated at 208/99

## 2013-10-10 NOTE — Progress Notes (Signed)
Patient ID: Tracie Estrada, female   DOB: December 21, 1965, 47 y.o.   MRN: [id number]  Chief Complaint  Patient presents with  . New Evaluation    eval lt br papilloma    HPI Tracie Estrada is a 47 y.o. female.   HPI patient sent at the request of Dr.Jarosk for left breast papilloma. A 4 cm mass noted on recent left breast mammogram with left nipple discharge. This was bloody. 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 Over the weekend she developed acute swelling, redness and now has foul-smelling drainage from the nipple in the lateral left breast. She was seen in the emergency room and given a prescription for antibiotics and sent home. She could not afford the medication. She's had more left breast redness and now significant drainage which is foul smelling from both the nipple and lateral port of the same breast. Denies any fever or chills but she feels poorly.  Past Medical History  Diagnosis Date  . Hypertension     History reviewed. No pertinent past surgical history.  Family History  Problem Relation Age of Onset  . Breast cancer Mother   . Cancer Mother     brain  . Hypertension Father     Social History History  Substance Use Topics  . Smoking status: Current Every Day Smoker -- 1.00 packs/day for 26 years    Types: Cigarettes  . Smokeless tobacco: Never Used  . Alcohol Use: No    Allergies  Allergen Reactions  . Fish Allergy Anaphylaxis  . Peanuts [Peanut Oil] Anaphylaxis  . Other Other (See Comments)    Anesthesia.  Specific agent unknown.  Reaction unknown.     Current Outpatient Prescriptions  Medication Sig Dispense Refill  . acetaminophen (TYLENOL) 500 MG tablet Take 1,000 mg by mouth every 6 (six) hours as needed for moderate pain.       Marland Kitchen ibuprofen (ADVIL,MOTRIN) 200 MG tablet Take 400 mg by mouth every 6 (six) hours as needed for pain.       . clindamycin (CLEOCIN) 150 MG capsule Take 2 capsules (300 mg total) by mouth 3 (three) times daily.  60  capsule  0  . hydrochlorothiazide (HYDRODIURIL) 25 MG tablet Take 1 tablet (25 mg total) by mouth daily.  30 tablet  0  . HYDROcodone-acetaminophen (NORCO/VICODIN) 5-325 MG per tablet Take 1 tablet by mouth every 4 (four) hours as needed for moderate pain.  30 tablet  0  . lisinopril (PRINIVIL,ZESTRIL) 20 MG tablet Take 0.5 tablets (10 mg total) by mouth daily.  30 tablet  0  . oxyCODONE-acetaminophen (PERCOCET/ROXICET) 5-325 MG per tablet Take 1 tablet by mouth every 4 (four) hours as needed for pain.  20 tablet  0   No current facility-administered medications for this visit.    Review of Systems Review of Systems  Constitutional: Positive for fatigue. Negative for fever and chills.  HENT: Negative.   Eyes: Negative.   Respiratory: Negative.   Cardiovascular: Negative.   Gastrointestinal: Negative.   Genitourinary: Negative.   Hematological: Negative.   Psychiatric/Behavioral: Negative.     Blood pressure 192/88, pulse 92, resp. rate 16, height 4\' 11"  (1.499 m), weight 180 lb 6.4 oz (81.829 kg), last menstrual period 08/24/2013.  Physical Exam Physical Exam  Constitutional: She is oriented to person, place, and time. She appears well-developed and well-nourished.  HENT:  Head: Normocephalic.  Mouth/Throat: No oropharyngeal exudate.  Eyes: Pupils are equal, round, and reactive to light. No scleral icterus.  Cardiovascular: Normal rate.   Pulmonary/Chest: Effort normal. Right breast exhibits no inverted nipple, no mass, no nipple discharge, no skin change and no tenderness. Left breast exhibits inverted nipple, mass, nipple discharge, skin change and tenderness.    Musculoskeletal: Normal range of motion.  Neurological: She is alert and oriented to person, place, and time.  Psychiatric: She has a normal mood and affect. Her behavior is normal. Judgment and thought content normal.    Data Reviewed Mammogram,  Path and U/S   4 cm solid cystic mass left breast 3 oclock.  Path  shows papilloma  Assessment    Left breast abscess and severe mastitis Left breast mass Mastitis severe and acute    Plan    Needs hospital admission for ABX and drainage.  Will set this up.  D/W Rosenbower.       Britteny Fiebelkorn A. 10/10/2013, 1:59 PM

## 2013-10-10 NOTE — Progress Notes (Signed)
ANTIBIOTIC CONSULT NOTE - INITIAL  Pharmacy Consult for vancomycin  Indication: abscess  Allergies  Allergen Reactions  . Fish Allergy Anaphylaxis  . Peanuts [Peanut Oil] Anaphylaxis  . Other Other (See Comments)    Anesthesia.  Specific agent unknown.  Reaction unknown.     Vital Signs: Temp: 99.4 F (37.4 C) (11/17 1534) Temp src: Oral (11/17 1534) BP: 222/116 mmHg (11/17 1536) Pulse Rate: 99 (11/17 1534)  Labs:  Recent Labs  10/08/13 2152  WBC 16.1*  HGB 12.1  PLT 464*  CREATININE 0.65    Microbiology: No results found for this or any previous visit (from the past 720 hour(s)).   Assessment: 12 yoF seen in the ED 11/15 w left breast pain after breast lump biospy performed 11/6 at [facility] positive for intraductal papilloma. Over the weekend she developed acute swelling, redness and now has foul-smelling drainage from the nipple in the lateral left breast. Patient was given an Rx for abx from an outside hospital but was not able to afford the Rx and was seen back in the ED 11/17. Patient is to be admitted for I&D and IV antibiotics. Pharmacy was consulted to dose vancomycin. I&D to be performed tomorrow in the OR.  Noted patient with Zosyn 3.375gm q8h ordered by MD  Tm 99.4  WBC 16.1  SCr nml at 0.65  No cultures have been drawn  Weight documented as 81.8kg, ht 59 inches, IBW 45kg, ABW 60kg, CrCl 99CG, >100 normalized   Goal of Therapy:  Vancomycin trough level 15-20 mcg/ml eradication of infection  Plan:  - vancomycin 1500mg  IV x 1 as a loading dose, then 1g IV q8h - vancomycin trough at steady state if indicated - follow-up clinical course, culture results, renal function - follow-up antibiotic de-escalation and length of therapy   Thank you for the consult.  Tomi Bamberger, PharmD Clinical Pharmacist Pager: [phone] Pharmacy: [phone] 10/10/2013 4:28 PM

## 2013-10-10 NOTE — Patient Instructions (Signed)
Mastitis  Mastitis is a bacterial infection of the breast tissue. CAUSES  Bacteria causes infection by entering the breast tissue through cuts or openings in the skin. Typically, this occurs with breastfeeding due to cracked or irritated skin. It can be associated with plugged ducts. Nipple piercing can also lead to mastitis. SYMPTOMS  In mastitis, an area of the breast becomes swollen, red, tender, and painful. You may notice you have a fever and swelling of the glands under your arm on that side. If the infection is allowed to progress, a collection of pus (abscess) may develop. DIAGNOSIS  Your caregiver can diagnose mastitis based on your symptoms and upon examination. The diagnosis can be confirmed if pus can be expressed from the breast. This pus can be examined in the lab to determine which bacteria are present. If an abscess has developed, the fluid in the abscess can be removed with a needle. This is used to confirm the diagnosis and determine the bacteria present. In most cases, pus will not be present. Blood tests can be done to determine if your body is fighting a bacterial infection. Sometimes, a mammogram or ultrasound will be recommended to exclude other breast diseases including cancer. Other rare forms of mastitis:  Tuberculosis mastitis is rare. The TB germ can affect the breast if it is present in some other part of the body. The breast may be slightly tender with a mass, but not tender or painful.  Syphilis of the nipple usually has an ulcer that is not tender.  Actinomycosis is a very rare bacterial infection of the breast that presents as a mass in the breast that is not tender or painful.  Phlebitis (inflammation of blood vessels) of the breast is an inflammation of the veins in the breast. It may be caused by tight fitting bras, surgery, or trauma to the breast.  Inflammatory carcinoma of the breast looks like mastitis because the breasts are red, swollen, or tender, but it  is a rare form of breast cancer. TREATMENT  Antibiotic medication is used to treat the bacterial infection. Your caregiver will determine which bacteria are most likely to be causing the infection and select an antibiotic. This is sometimes changed based on the results of cultures, or if there is no response to the antibiotic selected. Antibiotics are usually given by mouth. If you are breastfeeding, it is important to continue to empty the breast. Your caregiver can tell you whether or not this milk is safe for your infant, or needs to be thrown away. Pain can usually be treated with medication. HOME CARE INSTRUCTIONS   Take your antibiotics as directed. Finish them even if you start to feel better.  Only take over-the-counter or prescription medication for pain, discomfort, or fever as directed by your caregiver.  If breastfeeding, keep your nipples clean and dry. Your caregiver may tell you to stop nursing until he or she feels it is safe for your baby. Use a breast pump as instructed if forced to stop nursing.  Do not wear a tight bra. Wear a good support bra.  Empty the first breast completely before going to the other breast. If your baby is not emptying your breasts completely for some reason, use a breast pump to empty your breasts.  If you go back to work, pump your breasts while at work to stay in time with your nursing schedule.  Increase your fluid intake especially if you have a fever.  Avoid having your breasts get overly   filled with milk (engorged). SEEK MEDICAL CARE IF:   You develop pus-like (purulent) discharge from the breast.  Your symptoms get worse.  You do not seem to be responding to your treatment within 2 days. SEEK IMMEDIATE MEDICAL CARE IF:   You have a fever.  Your pain and swelling is getting worse.  You develop pain that is not controlled with medicine.  You develop a red line extending from the breast toward your armpit. Document Released:  11/10/2005 Document Revised: 02/02/2012 Document Reviewed: 06/10/2013 [employer] Patient Information 2014 [employer].  

## 2013-10-10 NOTE — Progress Notes (Signed)
I have seen and examined Tracie Estrada. We'll start her on broad-spectrum IV antibiotics today. Plan on incision and drainage of large breast abscess in the operating room tomorrow. Procedure and risks were discussed with her. Risks include but are not limited to bleeding, infection, wound problems, anesthesia, need for repeat procedure.

## 2013-10-11 ENCOUNTER — Encounter (HOSPITAL_COMMUNITY): Payer: MEDICAID | Admitting: Anesthesiology

## 2013-10-11 ENCOUNTER — Encounter (HOSPITAL_COMMUNITY): Admission: AD | Disposition: A | Discharge status: Home Health | Payer: Self-pay | Source: Ambulatory Visit

## 2013-10-11 ENCOUNTER — Encounter (HOSPITAL_COMMUNITY): Payer: Self-pay | Admitting: Anesthesiology

## 2013-10-11 ENCOUNTER — Observation Stay (HOSPITAL_COMMUNITY): Payer: Self-pay | Admitting: Anesthesiology

## 2013-10-11 HISTORY — PX: INCISION AND DRAINAGE ABSCESS: SHX5864

## 2013-10-11 SURGERY — INCISION AND DRAINAGE, ABSCESS
Anesthesia: General | Site: Breast | Laterality: Left | Wound class: Dirty or Infected

## 2013-10-11 MED ORDER — PANTOPRAZOLE SODIUM 20 MG PO TBEC
20.0000 mg | DELAYED_RELEASE_TABLET | Freq: Every day | ORAL | Status: DC
  Administered 2013-10-11 – 2013-10-15 (×5): 20 mg via ORAL
  Filled 2013-10-11 (×5): qty 1

## 2013-10-11 MED ORDER — SUCCINYLCHOLINE CHLORIDE 20 MG/ML IJ SOLN
INTRAMUSCULAR | Status: DC | PRN
  Administered 2013-10-11: 100 mg via INTRAVENOUS

## 2013-10-11 MED ORDER — LABETALOL HCL 5 MG/ML IV SOLN
INTRAVENOUS | Status: AC
  Filled 2013-10-11: qty 4

## 2013-10-11 MED ORDER — ONDANSETRON HCL 4 MG/2ML IJ SOLN
INTRAMUSCULAR | Status: DC | PRN
  Administered 2013-10-11: 4 mg via INTRAVENOUS

## 2013-10-11 MED ORDER — LIDOCAINE HCL (CARDIAC) 20 MG/ML IV SOLN
INTRAVENOUS | Status: DC | PRN
  Administered 2013-10-11: 100 mg via INTRAVENOUS

## 2013-10-11 MED ORDER — MIDAZOLAM HCL 5 MG/5ML IJ SOLN: INTRAMUSCULAR | Status: DC | PRN

## 2013-10-11 MED ORDER — LABETALOL HCL 5 MG/ML IV SOLN
10.0000 mg | Freq: Once | INTRAVENOUS | Status: AC
  Administered 2013-10-11: 10 mg via INTRAVENOUS

## 2013-10-11 MED ORDER — LACTATED RINGERS IV SOLN
INTRAVENOUS | Status: DC
  Administered 2013-10-11: 1000 mL via INTRAVENOUS

## 2013-10-11 MED ORDER — HYDROMORPHONE HCL PF 1 MG/ML IJ SOLN
INTRAMUSCULAR | Status: AC
  Filled 2013-10-11: qty 1

## 2013-10-11 MED ORDER — PROPOFOL 10 MG/ML IV BOLUS
INTRAVENOUS | Status: DC | PRN
  Administered 2013-10-11: 200 mg via INTRAVENOUS
  Administered 2013-10-11: 50 mg via INTRAVENOUS

## 2013-10-11 MED ORDER — DEXTROSE IN LACTATED RINGERS 5 % IV SOLN
INTRAVENOUS | Status: DC
  Administered 2013-10-11 – 2013-10-13 (×3): via INTRAVENOUS

## 2013-10-11 MED ORDER — LABETALOL HCL 5 MG/ML IV SOLN
5.0000 mg | Freq: Once | INTRAVENOUS | Status: AC
  Administered 2013-10-11: 5 mg via INTRAVENOUS

## 2013-10-11 MED ORDER — FENTANYL CITRATE 0.05 MG/ML IJ SOLN
INTRAMUSCULAR | Status: DC | PRN
  Administered 2013-10-11: 100 ug via INTRAVENOUS

## 2013-10-11 MED ORDER — MIDAZOLAM HCL 5 MG/5ML IJ SOLN
INTRAMUSCULAR | Status: DC | PRN
  Administered 2013-10-11: 2 mg via INTRAVENOUS

## 2013-10-11 MED ORDER — LORAZEPAM 1 MG PO TABS
1.0000 mg | ORAL_TABLET | Freq: Once | ORAL | Status: AC
  Administered 2013-10-11: 1 mg via ORAL
  Filled 2013-10-11: qty 1

## 2013-10-11 MED ORDER — DEXAMETHASONE SODIUM PHOSPHATE 10 MG/ML IJ SOLN
INTRAMUSCULAR | Status: DC | PRN
  Administered 2013-10-11: 10 mg via INTRAVENOUS

## 2013-10-11 MED ORDER — BUPIVACAINE-EPINEPHRINE PF 0.25-1:200000 % IJ SOLN
INTRAMUSCULAR | Status: AC
  Filled 2013-10-11: qty 30

## 2013-10-11 MED ORDER — HYDROMORPHONE HCL PF 1 MG/ML IJ SOLN
0.2500 mg | INTRAMUSCULAR | Status: DC | PRN
  Administered 2013-10-11: 0.5 mg via INTRAVENOUS

## 2013-10-11 MED ORDER — PROMETHAZINE HCL 25 MG/ML IJ SOLN: 6.2500 mg | INTRAMUSCULAR | Status: DC | PRN

## 2013-10-11 SURGICAL SUPPLY — 30 items
BANDAGE GAUZE ELAST BULKY 4 IN (GAUZE/BANDAGES/DRESSINGS) IMPLANT
BLADE SURG 15 STRL LF DISP TIS (BLADE) ×1 IMPLANT
BLADE SURG 15 STRL SS (BLADE) ×2
CANISTER SUCTION 2500CC (MISCELLANEOUS) ×2 IMPLANT
COVER SURGICAL LIGHT HANDLE (MISCELLANEOUS) ×2 IMPLANT
DECANTER SPIKE VIAL GLASS SM (MISCELLANEOUS) IMPLANT
DRAPE LAPAROSCOPIC ABDOMINAL (DRAPES) ×1 IMPLANT
DRSG PAD ABDOMINAL 8X10 ST (GAUZE/BANDAGES/DRESSINGS) ×1 IMPLANT
ELECT REM PT RETURN 9FT ADLT (ELECTROSURGICAL) ×2
ELECTRODE REM PT RTRN 9FT ADLT (ELECTROSURGICAL) ×1 IMPLANT
GLOVE BIO SURGEON STRL SZ7.5 (GLOVE) ×2 IMPLANT
GOWN PREVENTION PLUS LG XLONG (DISPOSABLE) ×4 IMPLANT
KIT BASIN OR (CUSTOM PROCEDURE TRAY) ×2 IMPLANT
NDL HYPO 25X1 1.5 SAFETY (NEEDLE) IMPLANT
NEEDLE HYPO 25X1 1.5 SAFETY (NEEDLE) IMPLANT
NS IRRIG 1000ML POUR BTL (IV SOLUTION) ×2 IMPLANT
PENCIL BUTTON HOLSTER BLD 10FT (ELECTRODE) ×2 IMPLANT
SPONGE GAUZE 4X4 12PLY (GAUZE/BANDAGES/DRESSINGS) ×1 IMPLANT
SPONGE LAP 18X18 X RAY DECT (DISPOSABLE) ×2 IMPLANT
SUT MNCRL AB 4-0 PS2 18 (SUTURE) IMPLANT
SUT VIC AB 3-0 SH 27 (SUTURE)
SUT VIC AB 3-0 SH 27XBRD (SUTURE) IMPLANT
SWAB COLLECTION DEVICE MRSA (MISCELLANEOUS) ×1 IMPLANT
SYR BULB 3OZ (MISCELLANEOUS) ×2 IMPLANT
SYR CONTROL 10ML LL (SYRINGE) IMPLANT
TAPE CLOTH SURG 4X10 WHT LF (GAUZE/BANDAGES/DRESSINGS) ×1 IMPLANT
TOWEL OR 17X26 10 PK STRL BLUE (TOWEL DISPOSABLE) ×2 IMPLANT
TUBE ANAEROBIC SPECIMEN COL (MISCELLANEOUS) ×1 IMPLANT
WATER STERILE IRR 1000ML POUR (IV SOLUTION) IMPLANT
YANKAUER SUCT BULB TIP NO VENT (SUCTIONS) ×2 IMPLANT

## 2013-10-11 NOTE — Anesthesia Postprocedure Evaluation (Signed)
  Anesthesia Post-op Note  Patient: Tracie Estrada  Procedure(s) Performed: Procedure(s) (LRB): INCISION AND DRAINAGE and debridement LEFT BREAST ABSCESS (Left)  Patient Location: PACU  Anesthesia Type: General  Level of Consciousness: awake and alert   Airway and Oxygen Therapy: Patient Spontanous Breathing  Post-op Pain: mild  Post-op Assessment: Post-op Vital signs reviewed, Patient's Cardiovascular Status Stable, Respiratory Function Stable, Patent Airway and No signs of Nausea or vomiting  Last Vitals:  Filed Vitals:   10/11/13 1445  BP: 188/92  Pulse: 69  Temp:   Resp: 22    Post-op Vital Signs: stable   Complications: No apparent anesthesia complications. BP below baseline after labetolol. She apparently has not been taking her BP medicine at home.

## 2013-10-11 NOTE — Anesthesia Preprocedure Evaluation (Addendum)
Anesthesia Evaluation  Patient identified by MRN, date of birth, ID band Patient awake    Reviewed: Allergy & Precautions, H&P , NPO status , Patient's Chart, lab work & pertinent test results  Airway Mallampati: II TM Distance: >3 FB Neck ROM: Full    Dental no notable dental hx.    Pulmonary Current Smoker,  breath sounds clear to auscultation  Pulmonary exam normal       Cardiovascular hypertension, Pt. on medications and Pt. on home beta blockers negative cardio ROS  Rhythm:Regular Rate:Normal     Neuro/Psych PSYCHIATRIC DISORDERS Anxiety negative neurological ROS     GI/Hepatic negative GI ROS, Neg liver ROS,   Endo/Other  negative endocrine ROS  Renal/GU negative Renal ROS  negative genitourinary   Musculoskeletal negative musculoskeletal ROS (+)   Abdominal (+) + obese,   Peds negative pediatric ROS (+)  Hematology negative hematology ROS (+)   Anesthesia Other Findings   Reproductive/Obstetrics Denies the possibility of pregnancy.                         Anesthesia Physical Anesthesia Plan  ASA: II  Anesthesia Plan: General   Post-op Pain Management:    Induction: Intravenous  Airway Management Planned: LMA  Additional Equipment:   Intra-op Plan:   Post-operative Plan: Extubation in OR  Informed Consent: I have reviewed the patients History and Physical, chart, labs and discussed the procedure including the risks, benefits and alternatives for the proposed anesthesia with the patient or authorized representative who has indicated his/her understanding and acceptance.   Dental advisory given  Plan Discussed with: CRNA  Anesthesia Plan Comments:         Anesthesia Quick Evaluation

## 2013-10-11 NOTE — Progress Notes (Signed)
Dr Gerrit Friends on floor.  Discussed patient continued elevated blood pressure and current medications ordered. Patient has stated that she had headache and one bout of nausea.  Medicated with tylenol and labetalol.  Orders received for Ativan due to patient nervousness about surgery.  Will continue to monitor

## 2013-10-11 NOTE — Progress Notes (Signed)
Labetalol 5 mg IVP given for elevated blood pressures- Dr. Council Mechanic aware

## 2013-10-11 NOTE — Transfer of Care (Signed)
Immediate Anesthesia Transfer of Care Note  Patient: Tracie Estrada  Procedure(s) Performed: Procedure(s): INCISION AND DRAINAGE and debridement LEFT BREAST ABSCESS (Left)  Patient Location: PACU  Anesthesia Type:General  Level of Consciousness: sedated  Airway & Oxygen Therapy: Patient Spontanous Breathing and Patient connected to face mask oxygen  Post-op Assessment: Report given to PACU RN and Post -op Vital signs reviewed and stable  Post vital signs: Reviewed and stable  Complications: No apparent anesthesia complications

## 2013-10-11 NOTE — Progress Notes (Signed)
Labetalol 10 IVP given for continual elevated blood pressures per Dr. Raliegh Ip order.

## 2013-10-11 NOTE — Op Note (Signed)
Operative Note  Tracie Estrada female 47 y.o. 10/11/2013  PREOPERATIVE DX:  Complex left breast abscess  POSTOPERATIVE DX:  Same  PROCEDURE:  Incision, drainage, and debridement of left breast abscess         Surgeon: Adolph Pollack   Assistants: Britt Bottom, PA-C  Anesthesia: General endotracheal anesthesia  Indications: This is a 47 year old female with a very large left breast abscess that had spontaneously drained some but is incompletely drained. Her breast is erythematous and very indurated. She now presents for the above procedure.    Procedure Detail:  She was brought to the operating room placed supine on the operating table Gen. Anesthetic was given. The left breast was sterilely prepped and draped. The indurated and fluctuant area was inferiorly. I made a linear incision through the fluctuant area. Purulent fluid was drained out and sent for culture. Digital inspection of the wound demonstrated a deep abscess cavity. I broke up loculations bluntly. I also sent some tissue for pathologic examination. The wound was then copiously your gated with saline solution. A 1/2 inch Penrose drain was then placed into the depth of the wound and anchored to the skin with a nylon suture. Bleeding points were controlled electrocautery. Once hemostasis was adequate, a bulky dressing was applied.  She tolerated the procedure well without any apparent complications and was taken to the recovery in satisfactory condition.  Estimated Blood Loss:  100 ml         Drains: PENROSE           Blood Given: none          Specimens: Abscess fluids sent for culture. Left breast tissue.        Complications:  * No complications entered in OR log *         Disposition: PACU - hemodynamically stable.         Condition: stable

## 2013-10-11 NOTE — Progress Notes (Signed)
Dr. Council Mechanic made aware of patient's blood pressures- O.K. To go to floor

## 2013-10-12 ENCOUNTER — Encounter (HOSPITAL_COMMUNITY): Payer: Self-pay | Admitting: General Surgery

## 2013-10-12 ENCOUNTER — Other Ambulatory Visit: Payer: Self-pay

## 2013-10-12 LAB — VANCOMYCIN, TROUGH: Vancomycin Tr: 34.2 ug/mL (ref 10.0–20.0)

## 2013-10-12 MED ORDER — LISINOPRIL 20 MG PO TABS
20.0000 mg | ORAL_TABLET | Freq: Every day | ORAL | Status: DC
  Filled 2013-10-12: qty 1

## 2013-10-12 MED ORDER — VANCOMYCIN HCL IN DEXTROSE 1-5 GM/200ML-% IV SOLN
1000.0000 mg | Freq: Two times a day (BID) | INTRAVENOUS | Status: DC
  Administered 2013-10-12: 1000 mg via INTRAVENOUS
  Filled 2013-10-12 (×2): qty 200

## 2013-10-12 MED ORDER — LIP MEDEX EX OINT
TOPICAL_OINTMENT | CUTANEOUS | Status: AC
  Administered 2013-10-12: 23:00:00
  Filled 2013-10-12: qty 7

## 2013-10-12 MED ORDER — LIP MEDEX EX OINT
TOPICAL_OINTMENT | CUTANEOUS | Status: AC
  Administered 2013-10-12: 11:00:00
  Filled 2013-10-12: qty 7

## 2013-10-12 NOTE — Progress Notes (Signed)
Patient seen and examined.  Has decreased erythema and induration.  Continue IV abxs.  Wait for culture results.

## 2013-10-12 NOTE — Progress Notes (Signed)
Utilization review completed.  

## 2013-10-12 NOTE — Progress Notes (Signed)
1 Day Post-Op  Subjective: Feel somewhat better, breast is still very large and tender.  With drainage from penrose drain.  Objective: Vital signs in last 24 hours: Temp:  [97.4 F (36.3 C)-98.6 F (37 C)] 97.9 F (36.6 C) (11/19 1025) Pulse Rate:  [60-91] 65 (11/19 1025) Resp:  [11-24] 16 (11/19 1025) BP: (152-210)/(78-111) 178/102 mmHg (11/19 0845) SpO2:  [92 %-100 %] 100 % (11/19 0801) Last BM Date: 10/09/13 Regular diet BP is up to 178/102 No labs  Intake/Output from previous day: 11/18 0701 - 11/19 0700 In: 4056.7 [P.O.:580; I.V.:2726.7; IV Piggyback:750] Out: 1100 [Urine:1100] Intake/Output this shift: Total I/O In: 120 [P.O.:120] Out: -   General appearance: alert, cooperative, no distress and still very uncomfortable. Resp: clear to auscultation bilaterally Breasts: left breast is still about 2-3 times the size of the right.  Open site with penrose.  Very tender but better than yesterday.  Lab Results:   Recent Labs  10/10/13 1706  WBC 12.5*  HGB 11.1*  HCT 32.3*  PLT 445*    BMET  Recent Labs  10/10/13 1706  CREATININE 0.79   PT/INR No results found for this basename: LABPROT, INR,  in the last 72 hours  No results found for this basename: AST, ALT, ALKPHOS, BILITOT, PROT, ALBUMIN,  in the last 168 hours   Lipase  No results found for this basename: lipase     Studies/Results: Dg Chest 2 View  10/10/2013   CLINICAL DATA:  Chest pain.  EXAM: CHEST  2 VIEW  COMPARISON:  02/16/2013.  FINDINGS: The heart size and mediastinal contours are within normal limits. Both lungs are clear. The visualized skeletal structures are unremarkable.  IMPRESSION: No active cardiopulmonary disease.   Electronically Signed   By: Loralie Champagne M.D.   On: 10/10/2013 18:00    Medications: . enoxaparin (LOVENOX) injection  40 mg Subcutaneous Q24H  . hydrochlorothiazide  25 mg Oral Daily  . lip balm      . lisinopril  10 mg Oral Daily  . pantoprazole  20 mg Oral  Daily  . piperacillin-tazobactam (ZOSYN)  IV  3.375 g Intravenous Q8H  . vancomycin  1,000 mg Intravenous Q8H   Prior to Admission medications   Medication Sig Start Date End Date Taking? Authorizing Provider  acetaminophen (TYLENOL) 500 MG tablet Take 1,000 mg by mouth every 6 (six) hours as needed for moderate pain.    Yes Historical Provider, MD  ibuprofen (ADVIL,MOTRIN) 100 MG/5ML suspension Take 600 mg by mouth every 4 (four) hours as needed.   Yes Historical Provider, MD   Specimen Description  ABSCESS LT BREAST   Special Requests  PATIENT ON FOLLOWING ZOSYN   Gram Stain  RARE WBC PRESENT, PREDOMINANTLY PMN NO SQUAMOUS EPITHELIAL CELLS SEEN NO ORGANISMS SEEN Performed at [facility]  Culture  Culture reincubated for better growth     Assessment/Plan Complex left breast abscess Hypertension  She is not on medicines at home Tobacco use  Plan:  Continue IV antibiotics, increase ACE, ask case management to see and help with getting care at home.  She will need dressing changes and may have the drain at home. Recheck labs in AM    LOS: 2 days    Tracie Estrada 10/12/2013

## 2013-10-12 NOTE — Care Management Note (Signed)
    Page 1 of 2   10/14/2013     12:15:20 PM   CARE MANAGEMENT NOTE 10/14/2013  Patient:  Tracie Estrada, Tracie Estrada   Account Number:  [id number]  Date Initiated:  10/12/2013  Documentation initiated by:  Lorenda Ishihara  Subjective/Objective Assessment:   47 yo female admitted with breast abscess, to OR for I&D 11/18. PTA lived at home with family.     Action/Plan:   Home when stable   Anticipated DC Date:  10/15/2013   Anticipated DC Plan:  HOME W HOME HEALTH SERVICES      DC Planning Services  Medication Assistance  CM consult  PCP issues      [facility] Choice  HOME HEALTH   Choice offered to / List presented to:  C-1 Patient        HH arranged  HH-1 RN      [facility] agency  [facility]   Status of service:  Completed, signed off Medicare Important Message given?   (If response is "NO", the following Medicare IM given date fields will be blank) Date Medicare IM given:   Date Additional Medicare IM given:    Discharge Disposition:  HOME W HOME HEALTH SERVICES  Per UR Regulation:  Reviewed for med. necessity/level of care/duration of stay  If discussed at Long Length of Stay Meetings, dates discussed:    Comments:  10-14-13 Lorenda Ishihara RN CM 1200 Spoke with patient at bedside, discussed potential cost of medications. She feels she can afford current regimen (approx $49). Does not want to use MATCH at this time. Arranged HH with [facility] per patient, they will follow for d/c needs. Discussed with Will Jenning PA.  10-12-13 Lorenda Ishihara RN CM [phone] Spoke with patient at bedside. States she has not seen PCP since [facility] closed. Has not been taking BP meds for years, states she can't afford but then states she doesn't need them. States since she was 47yo her BP has been over 200 systolic. Agreed to f/u appt at [facility], called and schduled for 12/2 at 10am with Dr. Gailen Shelter. Discussed issues with compliance with meds and medication assistance, patient was placed on Lisinopril and  HZTC in ED which are very inexpensive meds ($9 ea). Talked to patient about MATCH which would place these meds at $3 each. Unsure which Abx she will d/c on. Will discusse with attending and decide options at d/c.

## 2013-10-12 NOTE — Progress Notes (Signed)
ANTIBIOTIC CONSULT NOTE - FOLLOW UP  Pharmacy Consult for IV Vancomycin Indication: L breast abscess  Allergies  Allergen Reactions  . Fish Allergy Anaphylaxis  . Peanuts [Peanut Oil] Anaphylaxis  . Other Other (See Comments)    Anesthesia.  Specific agent unknown.  Reaction unknown.    Intake/Output from previous day: 11/18 0701 - 11/19 0700 In: 4056.7 [P.O.:580; I.V.:2726.7; IV Piggyback:750] Out: 1100 [Urine:1100] Intake/Output from this shift: Total I/O In: 120 [P.O.:120] Out: 400 [Urine:400]  Labs:  Recent Labs  10/10/13 1706  WBC 12.5*  HGB 11.1*  PLT 445*  CREATININE 0.79   Estimated Creatinine Clearance: 78.7 ml/min (by C-G formula based on Cr of 0.79).  Recent Labs  10/12/13 0925  VANCOTROUGH 34.2*     Assessment: 47 yoF seen in the ED 11/15 w left breast pain after breast lump biospy performed 11/6 at [facility] positive for intraductal papilloma. Over the weekend she developed acute swelling, redness and now has foul-smelling drainage from the nipple in the lateral left breast. Patient was given an Rx for abx from an outside hospital but was not able to afford the Rx and was seen back in the ED 11/17. Vancomycin and Zosyn started inpatient.   11/17 >> Vanc >> 11/17 >> Zosyn (MD) >>   Tmax: afeb WBCs: Improved to 12.5 on 11/17 Renal: SCr 0.79, CG 79, N 98  11/17 MRSA PCR >> negative 11/18 L breast abscess >> re-incubated  Today is D#2 Vanc and Zosyn. Pt is s/p I&D of breast abscess in the OR 11/18 and culture pending  Vanc trough was checked this morning and elevated at 34.2 but note this is a FALSELY ELEVATED LEVEL as vancomycin was infusing while trough was drawn.   Goal of Therapy:  Vancomycin trough level 15-20 mcg/ml  Plan:   Change Vancomycin to 1g IV q12h per current renal function, next dose due at 2200 tonight  Continue Zosyn as ordered by MD  Pharmacy will f/u  Geoffry Paradise, PharmD, BCPS Pager: [phone] 11:14 AM Pharmacy #: [id number]

## 2013-10-12 NOTE — Progress Notes (Signed)
1 Day Post-Op  Subjective: Patient is feeling well with adequate pain control.   Objective: Vital signs in last 24 hours: Temp:  [97.4 F (36.3 C)-98.8 F (37.1 C)] 98.4 F (36.9 C) (11/19 0513) Pulse Rate:  [68-91] 68 (11/19 0513) Resp:  [11-24] 18 (11/19 0513) BP: (152-210)/(78-111) 190/90 mmHg (11/19 0513) SpO2:  [92 %-100 %] 99 % (11/19 0513) Last BM Date: 10/09/13  Intake/Output from previous day: 11/18 0701 - 11/19 0700 In: 4056.7 [P.O.:580; I.V.:2726.7; IV Piggyback:750] Out: 1100 [Urine:1100] Intake/Output this shift:    PE: Gen:  Alert, NAD, pleasant Breast: incision is less indurated and less erythema. Penrose drain in place.  No purulent drainage.  Lab Results:   Recent Labs  10/10/13 1706  WBC 12.5*  HGB 11.1*  HCT 32.3*  PLT 445*   BMET  Recent Labs  10/10/13 1706  CREATININE 0.79   PT/INR No results found for this basename: LABPROT, INR,  in the last 72 hours CMP     Component Value Date/Time   NA 134* 10/08/2013 2152   K 3.1* 10/08/2013 2152   CL 98 10/08/2013 2152   CO2 27 10/08/2013 2152   GLUCOSE 82 10/08/2013 2152   BUN 8 10/08/2013 2152   CREATININE 0.79 10/10/2013 1706   CALCIUM 9.2 10/08/2013 2152   GFRNONAA >90 10/10/2013 1706   GFRAA >90 10/10/2013 1706   Lipase  No results found for this basename: lipase       Studies/Results: Dg Chest 2 View  10/10/2013   CLINICAL DATA:  Chest pain.  EXAM: CHEST  2 VIEW  COMPARISON:  02/16/2013.  FINDINGS: The heart size and mediastinal contours are within normal limits. Both lungs are clear. The visualized skeletal structures are unremarkable.  IMPRESSION: No active cardiopulmonary disease.   Electronically Signed   By: Loralie Champagne M.D.   On: 10/10/2013 18:00    Anti-infectives: Anti-infectives   Start     Dose/Rate Route Frequency Ordered Stop   10/11/13 0200  vancomycin (VANCOCIN) IVPB 1000 mg/200 mL premix     1,000 mg 200 mL/hr over 60 Minutes Intravenous Every 8 hours  10/10/13 1629     10/10/13 1800  vancomycin (VANCOCIN) 1,500 mg in sodium chloride 0.9 % 500 mL IVPB     1,500 mg 250 mL/hr over 120 Minutes Intravenous  Once 10/10/13 1629 10/10/13 1924   10/10/13 1700  piperacillin-tazobactam (ZOSYN) IVPB 3.375 g     3.375 g 12.5 mL/hr over 240 Minutes Intravenous Every 8 hours 10/10/13 1556     10/10/13 1600  ceFAZolin (ANCEF) IVPB 2 g/50 mL premix  Status:  Discontinued     2 g 100 mL/hr over 30 Minutes Intravenous Every 8 hours 10/10/13 1525 10/10/13 1556       Assessment/Plan  POD #1 1. Complex left breast abscess s/p incision, drainage and debridement of left breast abscess 11.18.14 Dr. Avel Peace - patient tolerating procedure well. No fever. On broad spectrum abxs and awaiting culture results. 2. HTN - not well controlled. Lisinopril and labetalol. If HTN continues to be difficult to control will get a Medicine consult.  3. VTE - heparin, scds, ambulation 4. Pain control - adequate 5. dispo - follow in hospital.     LOS: 2 days    SEALS, JAMIE 10/12/2013, 7:59 AM Pager: [phone]

## 2013-10-13 ENCOUNTER — Encounter (HOSPITAL_COMMUNITY): Payer: Self-pay | Admitting: General Surgery

## 2013-10-13 DIAGNOSIS — I10 Essential (primary) hypertension: Secondary | ICD-10-CM

## 2013-10-13 DIAGNOSIS — E876 Hypokalemia: Secondary | ICD-10-CM | POA: Diagnosis present

## 2013-10-13 DIAGNOSIS — N179 Acute kidney failure, unspecified: Secondary | ICD-10-CM | POA: Diagnosis not present

## 2013-10-13 HISTORY — DX: Acute kidney failure, unspecified: N17.9

## 2013-10-13 HISTORY — DX: Essential (primary) hypertension: I10

## 2013-10-13 LAB — URINALYSIS, ROUTINE W REFLEX MICROSCOPIC
Bilirubin Urine: NEGATIVE
Glucose, UA: NEGATIVE mg/dL
Hgb urine dipstick: NEGATIVE
Ketones, ur: NEGATIVE mg/dL
Leukocytes, UA: NEGATIVE
Nitrite: NEGATIVE
Protein, ur: NEGATIVE mg/dL
Specific Gravity, Urine: 1.013 (ref 1.005–1.030)
Urobilinogen, UA: 0.2 mg/dL (ref 0.0–1.0)
pH: 7 (ref 5.0–8.0)

## 2013-10-13 LAB — CBC
HCT: 31.7 % — ABNORMAL LOW (ref 36.0–46.0)
Hemoglobin: 10.6 g/dL — ABNORMAL LOW (ref 12.0–15.0)
RDW: 14.9 % (ref 11.5–15.5)
WBC: 13.5 10*3/uL — ABNORMAL HIGH (ref 4.0–10.5)

## 2013-10-13 LAB — BASIC METABOLIC PANEL
BUN: 20 mg/dL (ref 6–23)
Chloride: 96 mEq/L (ref 96–112)
GFR calc Af Amer: 31 mL/min — ABNORMAL LOW (ref >90)
GFR calc non Af Amer: 27 mL/min — ABNORMAL LOW (ref >90)
Glucose, Bld: 119 mg/dL — ABNORMAL HIGH (ref 70–99)
Potassium: 3.2 mEq/L — ABNORMAL LOW (ref 3.5–5.1)

## 2013-10-13 MED ORDER — LISINOPRIL 20 MG PO TABS
20.0000 mg | ORAL_TABLET | Freq: Every day | ORAL | Status: DC
  Filled 2013-10-13: qty 1

## 2013-10-13 MED ORDER — POTASSIUM CHLORIDE CRYS ER 20 MEQ PO TBCR
40.0000 meq | EXTENDED_RELEASE_TABLET | Freq: Once | ORAL | Status: AC
  Administered 2013-10-13: 40 meq via ORAL
  Filled 2013-10-13: qty 2

## 2013-10-13 MED ORDER — POLYETHYLENE GLYCOL 3350 17 G PO PACK
17.0000 g | PACK | Freq: Every day | ORAL | Status: DC
  Administered 2013-10-13: 17 g via ORAL
  Filled 2013-10-13 (×5): qty 1

## 2013-10-13 MED ORDER — VITAMINS A & D EX OINT
TOPICAL_OINTMENT | CUTANEOUS | Status: AC
  Administered 2013-10-13: 23:00:00
  Filled 2013-10-13: qty 5

## 2013-10-13 MED ORDER — AMLODIPINE BESYLATE 10 MG PO TABS
10.0000 mg | ORAL_TABLET | Freq: Every day | ORAL | Status: DC
  Administered 2013-10-13 – 2013-10-17 (×5): 10 mg via ORAL
  Filled 2013-10-13 (×5): qty 1

## 2013-10-13 MED ORDER — HYDRALAZINE HCL 25 MG PO TABS
25.0000 mg | ORAL_TABLET | Freq: Three times a day (TID) | ORAL | Status: DC
  Administered 2013-10-13 – 2013-10-15 (×6): 25 mg via ORAL
  Filled 2013-10-13 (×9): qty 1

## 2013-10-13 MED ORDER — DOXYCYCLINE HYCLATE 100 MG PO TABS
100.0000 mg | ORAL_TABLET | Freq: Two times a day (BID) | ORAL | Status: DC
  Administered 2013-10-13 – 2013-10-14 (×4): 100 mg via ORAL
  Filled 2013-10-13 (×6): qty 1

## 2013-10-13 NOTE — Consult Note (Addendum)
[facility] Consult Note History and Physical  Tracie Estrada:[id number] DOB: 05-Nov-1966 DOA: 10/10/2013  Referring physician: Dr. Avel Peace Reason for consultation: Elevated blood pressure, acute renal failure. PCP: No PCP Per Patient   Chief Complaint: Breast pain left breast.   History of Present Illness: Tracie Estrada is an 47 y.o. female with a PMH of MRSA nasal colonization, HTN, non-compliant with medication secondary to cost issues and therefore not on any anti-HTN medication for the past 8 months, who was admitted by Dr. Abbey Chatters secondary to breast abscess.  She underwent an I&D of the left breast abscess on 10/11/13 with purulent material drained.  Cultures are preliminarily growing staph.  She was treated with empiric vancomycin and zosyn, but the vancomycin was changed to doxycycline secondary to worsening creatinine (baseline creatinine 0.65, current creatinine 2.10).  She also has had significant hypertension postoperatively blood pressure readings as high as 212/101. The bump in her creatinine was temporally related to the initiation of lisinopril, hydrochlorothiazide, and vancomycin. The patient denies any significant symptoms related to her high blood pressure. Specifically, no blurry vision, no headaches, no presyncope.  Review of Systems: Constitutional: No fever, no chills;  Appetite diminished; No weight loss, no weight gain.  HEENT: No blurry vision, no diplopia, no pharyngitis, no dysphagia CV: No chest pain, no palpitations.  Resp: + SOB with pain medicine intake, + cough productive of yellow mucous. GI: + nausea, + vomiting, no diarrhea, no melena, no hematochezia.  GU: No dysuria, no hematuria.  MSK: no myalgias, no arthralgias.  Neuro:  No headache, no focal neurological deficits, no history of seizures.  Psych: No depression, no anxiety.  Endo: No thyroid disease, no DM, no heat intolerance, no cold intolerance, no polyuria, no polydipsia  Skin: No rashes,  no skin lesions.  Heme: No easy bruising, no history of blood diseases.  Past Medical History Past Medical History  Diagnosis Date  . Hypertension   . Malignant hypertension 10/13/2013  . Acute renal failure 10/13/2013    On Vancomycin  . Breast abscess of female     Status post biopsy     Past Surgical History Past Surgical History  Procedure Laterality Date  . Biopsy of left breast    . Incision and drainage abscess Left 10/11/2013    Procedure: INCISION AND DRAINAGE and debridement LEFT BREAST ABSCESS;  Surgeon: Adolph Pollack, MD;  Location: [facility];  Service: General;  Laterality: Left;     Social History: History   Social History  . Marital Status: Single    Spouse Name: N/A    Number of Children: N/A  . Years of Education: N/A   Occupational History  . Not on file.   Social History Main Topics  . Smoking status: Current Every Day Smoker -- 1.00 packs/day for 26 years    Types: Cigarettes  . Smokeless tobacco: Never Used  . Alcohol Use: No  . Drug Use: No  . Sexual Activity: No   Other Topics Concern  . Not on file   Social History Narrative   Single.  Lives with daugher and grandchildren.    Family History:  Family History  Problem Relation Age of Onset  . Breast cancer Mother   . Cancer Mother     brain  . Hypertension Father   . Thyroid disease Daughter     Allergies: Fish allergy; Peanuts; and Other  Meds: Prior to Admission medications   Medication Sig Start Date End Date Taking? Authorizing  Provider  acetaminophen (TYLENOL) 500 MG tablet Take 1,000 mg by mouth every 6 (six) hours as needed for moderate pain.    Yes Historical Provider, MD  ibuprofen (ADVIL,MOTRIN) 100 MG/5ML suspension Take 600 mg by mouth every 4 (four) hours as needed.   Yes Historical Provider, MD    Physical Exam: Filed Vitals:   10/12/13 1416 10/12/13 2230 10/13/13 0524 10/13/13 0722  BP:  187/82 212/101 188/98  Pulse: 59 64 83   Temp: 98.4 F (36.9 C)  97.1 F (36.2 C) 98.6 F (37 C)   TempSrc: Oral Oral Oral   Resp:  18 18   Height:      Weight:      SpO2: 98% 99% 98%      Physical Exam: Blood pressure 188/98, pulse 83, temperature 98.6 F (37 C), temperature source Oral, resp. rate 18, height 4\' 11"  (1.499 m), weight 81.829 kg (180 lb 6.4 oz), last menstrual period 08/24/2013, SpO2 98.00%. Gen: No acute distress. Head: Normocephalic, atraumatic. Eyes: PERRL, EOMI, sclerae nonicteric. Mouth: Oropharynx clear with moist mucous membranes. Neck: Supple, no thyromegaly, no lymphadenopathy, no jugular venous distention. Chest: Lungs clear to auscultation bilaterally. CV: Heart sounds are regular. No murmurs, rubs, or gallops. Abdomen: Soft, nontender, nondistended with normal active bowel sounds. Extremities: Extremities are without clubbing, edema, or cyanosis. Skin: Warm and dry. Left breast abscess with drain in place, open wound draining purulent material. Neuro: Alert and oriented times 3; cranial nerves II through XII grossly intact. Psych: Mood and affect anxious/angry.  Labs on Admission:  Basic Metabolic Panel:  Recent Labs Lab 10/08/13 2152 10/10/13 1706 10/13/13 0343  NA 134*  --  137  K 3.1*  --  3.2*  CL 98  --  96  CO2 27  --  29  GLUCOSE 82  --  119*  BUN 8  --  20  CREATININE 0.65 0.79 2.10*  CALCIUM 9.2  --  8.9   GFR Estimated Creatinine Clearance: 30 ml/min (by C-G formula based on Cr of 2.1).  CBC:  Recent Labs Lab 10/08/13 2152 10/10/13 1706 10/13/13 0343  WBC 16.1* 12.5* 13.5*  NEUTROABS 12.2*  --   --   HGB 12.1 11.1* 10.6*  HCT 34.9* 32.3* 31.7*  MCV 81.9 81.6 82.1  PLT 464* 445* 409*   Cardiac Enzymes:  Recent Labs Lab 10/08/13 2152  TROPONINI <0.30   Microbiology Recent Results (from the past 240 hour(s))  MRSA PCR SCREENING     Status: None   Collection Time    10/10/13  4:50 PM      Result Value Range Status   MRSA by PCR NEGATIVE  NEGATIVE Final   Comment:             The GeneXpert MRSA Assay (FDA     approved for NASAL specimens     only), is one component of a     comprehensive MRSA colonization     surveillance program. It is not     intended to diagnose MRSA     infection nor to guide or     monitor treatment for     MRSA infections.  ANAEROBIC CULTURE     Status: None   Collection Time    10/11/13 12:49 PM      Result Value Range Status   Specimen Description ABSCESS LT BREAST   Final   Special Requests PATIENT ON FOLLOWING ZOSYN   Final   Gram Stain     Final  Value: RARE WBC PRESENT, PREDOMINANTLY PMN     NO SQUAMOUS EPITHELIAL CELLS SEEN     NO ORGANISMS SEEN     Performed at [facility]   Culture     Final   Value: NO ANAEROBES ISOLATED; CULTURE IN PROGRESS FOR 5 DAYS     Performed at [facility]   Report Status PENDING   Incomplete  CULTURE, ROUTINE-ABSCESS     Status: None   Collection Time    10/11/13 12:49 PM      Result Value Range Status   Specimen Description ABSCESS LT BREAST   Final   Special Requests PATIENT ON FOLLOWING ZOSYN   Final   Gram Stain     Final   Value: RARE WBC PRESENT, PREDOMINANTLY PMN     NO SQUAMOUS EPITHELIAL CELLS SEEN     NO ORGANISMS SEEN     Performed at [facility]   Culture     Final   Value: FEW STAPHYLOCOCCUS AUREUS     Note: RIFAMPIN AND GENTAMICIN SHOULD NOT BE USED AS SINGLE DRUGS FOR TREATMENT OF STAPH INFECTIONS.     Performed at [facility]   Report Status PENDING   Incomplete      Radiological Exams on Admission: No results found.   Assessment/Plan Principal Problem:   Breast mass, left Management per general surgery. Status post I and D. on 10/11/2013. Preliminary cultures positive for staph aureus and has known MRSA nasal carriage. Agree with doxycycline for now. Active Problems:   SMOKER Counsel cessation per nursing staff.   Malignant hypertension Start Norvasc and hydralazine. Avoid ACE/ARB/diuretics for now.   Acute renal  failure Likely secondary to endorgan damage from malignant hypertension in the setting of diuretic therapy, vancomycin and an ACE inhibitor. Discontinue vancomycin, diuretic and ACE inhibitor. Gently hydrate. Check urinalysis.   Hypokalemia Secondary to diuretics. Replete.  Thank you for this consultation.  We will follow the patient with you. Time spent: 80 minutes.  RAMA,CHRISTINA [facility] Pager [phone]  If 7PM-7AM, please contact night-coverage [email or web address] Password [email or web address]

## 2013-10-13 NOTE — Progress Notes (Signed)
2 Days Post-Op  Subjective: Feels somewhat better, wants to go home.   Objective: Vital signs in last 24 hours: Temp:  [97.1 F (36.2 C)-98.6 F (37 C)] 98.6 F (37 C) (11/20 0524) Pulse Rate:  [59-83] 83 (11/20 0524) Resp:  [16-18] 18 (11/20 0524) BP: (158-212)/(82-106) 212/101 mmHg (11/20 0524) SpO2:  [98 %-100 %] 98 % (11/20 0524) Last BM Date: 10/09/13 BP was improving some yesterday, but this Am it was up to 212/101 She was treated with labetalol, and BP is better 188/98 this AM.   Creatinine has gone from .79 on 11/17 to 2.1 this Am WBC is also up Intake/Output from previous day: 11/19 0701 - 11/20 0700 In: 2040 [P.O.:1240; I.V.:450; IV Piggyback:350] Out: 1600 [Urine:1600] Intake/Output this shift:    General appearance: alert, cooperative and no distress Breasts: Left breast still very swollen, tender but less than yesterday, some decrease in induration around the open site.  Still has a fair amount of drainage.  Wound is 6 cm deep..Picture below.    Left breast POD #2  Lab Results:   Recent Labs  10/10/13 1706 10/13/13 0343  WBC 12.5* 13.5*  HGB 11.1* 10.6*  HCT 32.3* 31.7*  PLT 445* 409*    BMET  Recent Labs  10/10/13 1706 10/13/13 0343  NA  --  137  K  --  3.2*  CL  --  96  CO2  --  29  GLUCOSE  --  119*  BUN  --  20  CREATININE 0.79 2.10*  CALCIUM  --  8.9   PT/INR No results found for this basename: LABPROT, INR,  in the last 72 hours  No results found for this basename: AST, ALT, ALKPHOS, BILITOT, PROT, ALBUMIN,  in the last 168 hours   Lipase  No results found for this basename: lipase     Studies/Results: No results found.  Medications: . doxycycline  100 mg Oral Q12H  . enoxaparin (LOVENOX) injection  40 mg Subcutaneous Q24H  . hydrochlorothiazide  25 mg Oral Daily  . lisinopril  20 mg Oral Daily  . pantoprazole  20 mg Oral Daily  . piperacillin-tazobactam (ZOSYN)  IV  3.375 g Intravenous Q8H    Assessment/Plan Complex  left breast abscess  (few staph on culture so far, culture report not complete at this time.) Hypertension Uncontrolled with lisinopril and Hctz. (She is not on medicines at home.) Acute renal failure/on Vancomycin Tobacco use Lovenox for DVT  PLAN:  Hold vancomycin, continue drain, add doxycycline to Zosyn while awaiting culture final result.   Continue drain and local wound care.  I have irrigated and cleaned site.  I have ask Medicine to see and help with BP and renal function.  Hold ACE and hctz till Dr. Darnelle Catalan can see her.       LOS: 3 days    Tracie Estrada 10/13/2013

## 2013-10-13 NOTE — Progress Notes (Signed)
She has developed Manko mycin nephrotoxicity. Her cellulitis and induration continued to improve. We'll switch her over to oral doxycycline and continue IV Zosyn. We will monitor her white blood cell count and renal function.

## 2013-10-14 DIAGNOSIS — I10 Essential (primary) hypertension: Secondary | ICD-10-CM

## 2013-10-14 DIAGNOSIS — N63 Unspecified lump in unspecified breast: Secondary | ICD-10-CM

## 2013-10-14 DIAGNOSIS — N179 Acute kidney failure, unspecified: Secondary | ICD-10-CM

## 2013-10-14 LAB — BASIC METABOLIC PANEL
CO2: 31 mEq/L (ref 19–32)
Calcium: 9.1 mg/dL (ref 8.4–10.5)
Chloride: 97 mEq/L (ref 96–112)
Creatinine, Ser: 1.97 mg/dL — ABNORMAL HIGH (ref 0.50–1.10)
GFR calc non Af Amer: 29 mL/min — ABNORMAL LOW (ref >90)
Glucose, Bld: 124 mg/dL — ABNORMAL HIGH (ref 70–99)
Potassium: 3.5 mEq/L (ref 3.5–5.1)
Sodium: 135 mEq/L (ref 135–145)

## 2013-10-14 LAB — CULTURE, ROUTINE-ABSCESS

## 2013-10-14 LAB — CBC
HCT: 32.9 % — ABNORMAL LOW (ref 36.0–46.0)
Hemoglobin: 11.1 g/dL — ABNORMAL LOW (ref 12.0–15.0)
MCH: 27.9 pg (ref 26.0–34.0)
MCHC: 33.7 g/dL (ref 30.0–36.0)
MCV: 82.7 fL (ref 78.0–100.0)
Platelets: 409 10*3/uL — ABNORMAL HIGH (ref 150–400)
RBC: 3.98 MIL/uL (ref 3.87–5.11)

## 2013-10-14 NOTE — Progress Notes (Signed)
[facility] PROGRESS NOTE CONSULT  AKANKSHA BELLMORE VHQ:[id number] DOB: 05/13/1966 DOA: 10/10/2013 PCP: No PCP Per Patient  Assessment/Plan: Left Breast Abscess -S/p I and D. -Cx with staph: agree with doxy pending speciation. -Management as per surgery.  HTN -Starting to trend downward with addition of norvasc and hydralazine. -Avoid ACE-I/ARB given ARF.  ARF -Cr 1.97 11/21. -Likely 2/2 malignant HTN in setting of diuretic, ACE-I and probably vanc toxicity. -Continue IVF hydration. -Consider renal US if no dramatic improvement over the next few days.  Code Status: Full Code Family Communication: Patient only  Disposition Plan: To be determined   Antibiotics:  Doxycycline   Subjective: Feeling better.  Objective: Filed Vitals:   10/13/13 0722 10/13/13 1441 10/13/13 2230 10/14/13 0637  BP: 188/98 203/114 160/80 168/85  Pulse:  81 82 80  Temp:  98.3 F (36.8 C) 98.4 F (36.9 C) 98.3 F (36.8 C)  TempSrc:  Oral Oral Oral  Resp:  20 20 20   Height:      Weight:      SpO2:  97% 98% 97%    Intake/Output Summary (Last 24 hours) at 10/14/13 1507 Last data filed at 10/14/13 1000  Gross per 24 hour  Intake   1960 ml  Output   1250 ml  Net    710 ml   Filed Weights   10/10/13 1711 10/10/13 2002  Weight: 81.829 kg (180 lb 6.4 oz) 81.829 kg (180 lb 6.4 oz)    Exam:   General:  AA Ox3  Cardiovascular: RRR  Respiratory: CTA B  Abdomen: S/NT/ND/+BS  Extremities: no C/C/E/+pedal pulses   Neurologic:  Non-focal.  Data Reviewed: Basic Metabolic Panel:  Recent Labs Lab 10/08/13 2152 10/10/13 1706 10/13/13 0343 10/14/13 0427  NA 134*  --  137 135  K 3.1*  --  3.2* 3.5  CL 98  --  96 97  CO2 27  --  29 31  GLUCOSE 82  --  119* 124*  BUN 8  --  20 17  CREATININE 0.65 0.79 2.10* 1.97*  CALCIUM 9.2  --  8.9 9.1   Liver Function Tests: No results found for this basename: AST, ALT, ALKPHOS, BILITOT, PROT, ALBUMIN,  in the last 168 hours No  results found for this basename: LIPASE, AMYLASE,  in the last 168 hours No results found for this basename: AMMONIA,  in the last 168 hours CBC:  Recent Labs Lab 10/08/13 2152 10/10/13 1706 10/13/13 0343 10/14/13 0427  WBC 16.1* 12.5* 13.5* 13.5*  NEUTROABS 12.2*  --   --   --   HGB 12.1 11.1* 10.6* 11.1*  HCT 34.9* 32.3* 31.7* 32.9*  MCV 81.9 81.6 82.1 82.7  PLT 464* 445* 409* 409*   Cardiac Enzymes:  Recent Labs Lab 10/08/13 2152  TROPONINI <0.30   BNP (last 3 results) No results found for this basename: PROBNP,  in the last 8760 hours CBG: No results found for this basename: GLUCAP,  in the last 168 hours  Recent Results (from the past 240 hour(s))  MRSA PCR SCREENING     Status: None   Collection Time    10/10/13  4:50 PM      Result Value Range Status   MRSA by PCR NEGATIVE  NEGATIVE Final   Comment:            The GeneXpert MRSA Assay (FDA     approved for NASAL specimens     only), is one component of a  comprehensive MRSA colonization     surveillance program. It is not     intended to diagnose MRSA     infection nor to guide or     monitor treatment for     MRSA infections.  ANAEROBIC CULTURE     Status: None   Collection Time    10/11/13 12:49 PM      Result Value Range Status   Specimen Description ABSCESS LT BREAST   Final   Special Requests PATIENT ON FOLLOWING ZOSYN   Final   Gram Stain     Final   Value: RARE WBC PRESENT, PREDOMINANTLY PMN     NO SQUAMOUS EPITHELIAL CELLS SEEN     NO ORGANISMS SEEN     Performed at [facility]   Culture     Final   Value: NO ANAEROBES ISOLATED; CULTURE IN PROGRESS FOR 5 DAYS     Performed at [facility]   Report Status PENDING   Incomplete  CULTURE, ROUTINE-ABSCESS     Status: None   Collection Time    10/11/13 12:49 PM      Result Value Range Status   Specimen Description ABSCESS LT BREAST   Final   Special Requests PATIENT ON FOLLOWING ZOSYN   Final   Gram Stain     Final    Value: RARE WBC PRESENT, PREDOMINANTLY PMN     NO SQUAMOUS EPITHELIAL CELLS SEEN     NO ORGANISMS SEEN     Performed at [facility]   Culture     Final   Value: FEW STAPHYLOCOCCUS AUREUS     Note: RIFAMPIN AND GENTAMICIN SHOULD NOT BE USED AS SINGLE DRUGS FOR TREATMENT OF STAPH INFECTIONS.     Performed at [facility]   Report Status 10/14/2013 FINAL   Final   Organism ID, Bacteria STAPHYLOCOCCUS AUREUS   Final     Studies: No results found.  Scheduled Meds: . amLODipine  10 mg Oral Daily  . doxycycline  100 mg Oral Q12H  . enoxaparin (LOVENOX) injection  40 mg Subcutaneous Q24H  . hydrALAZINE  25 mg Oral Q8H  . pantoprazole  20 mg Oral Daily  . piperacillin-tazobactam (ZOSYN)  IV  3.375 g Intravenous Q8H  . polyethylene glycol  17 g Oral Daily   Continuous Infusions: . dextrose 5% lactated ringers 50 mL/hr at 10/14/13 0200    Principal Problem:   Breast mass, left Active Problems:   SMOKER   Malignant hypertension   Acute renal failure   Hypokalemia    Time spent: 25 minutes    [name],[name]  [facility] Pager [phone]  If 7PM-7AM, please contact night-coverage at [email or web address], password [facility] 10/14/2013, 3:07 PM  LOS: 4 days

## 2013-10-14 NOTE — Progress Notes (Signed)
Patient seen and examined.  Induration continues to decrease.

## 2013-10-14 NOTE — Progress Notes (Signed)
Spoke with Md Ardyth Harps regarding patient's blood pressure, patient stated that her daughter is stressing her out, no new orders, labetalol 20 mg given earlier, MD stated that patient has recently stated a lot of blood pressure medication and to just continue to monitor patient Stanford Breed RN 10-14-2013 18:30pm

## 2013-10-14 NOTE — Progress Notes (Signed)
3 Days Post-Op  Subjective: Feeling well but no appetite.   Objective: Vital signs in last 24 hours: Temp:  [98.3 F (36.8 C)-98.4 F (36.9 C)] 98.3 F (36.8 C) (11/21 8119) Pulse Rate:  [80-82] 80 (11/21 0637) Resp:  [20] 20 (11/21 0637) BP: (160-203)/(80-114) 168/85 mmHg (11/21 0637) SpO2:  [97 %-98 %] 97 % (11/21 0637) Last BM Date: 10/09/13  Intake/Output from previous day: 11/20 0701 - 11/21 0700 In: 2200 [P.O.:1200; I.V.:1000] Out: 1850 [Urine:1850] Intake/Output this shift:    PE: Gen:  Alert, NAD, pleasant Breast Exam - induration and erythema improving. No purulent drainage and drain is intact.  Lab Results:   Recent Labs  10/13/13 0343 10/14/13 0427  WBC 13.5* 13.5*  HGB 10.6* 11.1*  HCT 31.7* 32.9*  PLT 409* 409*   BMET  Recent Labs  10/13/13 0343 10/14/13 0427  NA 137 135  K 3.2* 3.5  CL 96 97  CO2 29 31  GLUCOSE 119* 124*  BUN 20 17  CREATININE 2.10* 1.97*  CALCIUM 8.9 9.1   PT/INR No results found for this basename: LABPROT, INR,  in the last 72 hours CMP     Component Value Date/Time   NA 135 10/14/2013 0427   K 3.5 10/14/2013 0427   CL 97 10/14/2013 0427   CO2 31 10/14/2013 0427   GLUCOSE 124* 10/14/2013 0427   BUN 17 10/14/2013 0427   CREATININE 1.97* 10/14/2013 0427   CALCIUM 9.1 10/14/2013 0427   GFRNONAA 29* 10/14/2013 0427   GFRAA 33* 10/14/2013 0427   Lipase  No results found for this basename: lipase       Studies/Results: No results found.  Anti-infectives: Anti-infectives   Start     Dose/Rate Route Frequency Ordered Stop   10/13/13 1000  doxycycline (VIBRA-TABS) tablet 100 mg     100 mg Oral Every 12 hours 10/13/13 0712     10/12/13 2200  vancomycin (VANCOCIN) IVPB 1000 mg/200 mL premix  Status:  Discontinued     1,000 mg 200 mL/hr over 60 Minutes Intravenous Every 12 hours 10/12/13 1114 10/13/13 0711   10/11/13 0200  vancomycin (VANCOCIN) IVPB 1000 mg/200 mL premix  Status:  Discontinued     1,000  mg 200 mL/hr over 60 Minutes Intravenous Every 8 hours 10/10/13 1629 10/12/13 1100   10/10/13 1800  vancomycin (VANCOCIN) 1,500 mg in sodium chloride 0.9 % 500 mL IVPB     1,500 mg 250 mL/hr over 120 Minutes Intravenous  Once 10/10/13 1629 10/10/13 1924   10/10/13 1700  piperacillin-tazobactam (ZOSYN) IVPB 3.375 g     3.375 g 12.5 mL/hr over 240 Minutes Intravenous Every 8 hours 10/10/13 1556     10/10/13 1600  ceFAZolin (ANCEF) IVPB 2 g/50 mL premix  Status:  Discontinued     2 g 100 mL/hr over 30 Minutes Intravenous Every 8 hours 10/10/13 1525 10/10/13 1556       Assessment/Plan  POD #3 Complex left breast abscess - doxycycline PO/IV Zosyn.  Hypertension - lisinopril/HCtz, internal medicine consulted Acute Renal Failure - Manko Mycin Nephrotoxicity - Cr from 2.10 to 1.97 today.  Tabacco use DVT - lovenox  Plan:  Waiting on cultures to determine appropriate abx. Continue to monitor Cr today.     LOS: 4 days    Britt Bottom PA-S [facility] 10/14/2013, 7:38 AM Pager: [phone]

## 2013-10-15 ENCOUNTER — Inpatient Hospital Stay (HOSPITAL_COMMUNITY): Payer: MEDICAID

## 2013-10-15 ENCOUNTER — Inpatient Hospital Stay (HOSPITAL_COMMUNITY): Payer: Self-pay

## 2013-10-15 DIAGNOSIS — D72829 Elevated white blood cell count, unspecified: Secondary | ICD-10-CM

## 2013-10-15 DIAGNOSIS — Z113 Encounter for screening for infections with a predominantly sexual mode of transmission: Secondary | ICD-10-CM

## 2013-10-15 DIAGNOSIS — A4901 Methicillin susceptible Staphylococcus aureus infection, unspecified site: Secondary | ICD-10-CM

## 2013-10-15 DIAGNOSIS — I10 Essential (primary) hypertension: Secondary | ICD-10-CM

## 2013-10-15 LAB — CBC
HCT: 32.8 % — ABNORMAL LOW (ref 36.0–46.0)
MCH: 27.5 pg (ref 26.0–34.0)
MCV: 82.6 fL (ref 78.0–100.0)
Platelets: 386 10*3/uL (ref 150–400)
RBC: 3.97 MIL/uL (ref 3.87–5.11)
RDW: 14.8 % (ref 11.5–15.5)

## 2013-10-15 LAB — BASIC METABOLIC PANEL
Calcium: 9 mg/dL (ref 8.4–10.5)
Creatinine, Ser: 1.95 mg/dL — ABNORMAL HIGH (ref 0.50–1.10)
GFR calc Af Amer: 34 mL/min — ABNORMAL LOW (ref >90)
GFR calc non Af Amer: 29 mL/min — ABNORMAL LOW (ref >90)
Sodium: 133 mEq/L — ABNORMAL LOW (ref 135–145)

## 2013-10-15 MED ORDER — HYDRALAZINE HCL 50 MG PO TABS
50.0000 mg | ORAL_TABLET | Freq: Three times a day (TID) | ORAL | Status: DC
  Administered 2013-10-15 – 2013-10-17 (×7): 50 mg via ORAL
  Filled 2013-10-15 (×9): qty 1

## 2013-10-15 MED ORDER — POTASSIUM CHLORIDE CRYS ER 20 MEQ PO TBCR
40.0000 meq | EXTENDED_RELEASE_TABLET | Freq: Once | ORAL | Status: AC
  Administered 2013-10-15: 40 meq via ORAL
  Filled 2013-10-15: qty 2

## 2013-10-15 MED ORDER — CEFAZOLIN SODIUM 1-5 GM-% IV SOLN
1.0000 g | Freq: Three times a day (TID) | INTRAVENOUS | Status: DC
  Administered 2013-10-15 – 2013-10-16 (×3): 1 g via INTRAVENOUS
  Filled 2013-10-15 (×4): qty 50

## 2013-10-15 MED ORDER — SODIUM CHLORIDE 0.9 % IV SOLN
INTRAVENOUS | Status: DC
  Administered 2013-10-15: 21:00:00 via INTRAVENOUS
  Administered 2013-10-15: 100 mL/h via INTRAVENOUS

## 2013-10-15 MED ORDER — HYDRALAZINE HCL 20 MG/ML IJ SOLN: 5.0000 mg | INTRAMUSCULAR | Status: DC | PRN

## 2013-10-15 NOTE — Progress Notes (Signed)
ANTIBIOTIC CONSULT NOTE - INITIAL  Pharmacy Consult for Ancef Indication: L breast abscess, MSSA  Allergies  Allergen Reactions  . Fish Allergy Anaphylaxis  . Peanuts [Peanut Oil] Anaphylaxis  . Other Other (See Comments)    Anesthesia.  Specific agent unknown.  Reaction unknown.   . Vancomycin Other (See Comments)    Nephrotoxicity    Patient Measurements: Height: 4\' 11"  (149.9 cm) Weight: 180 lb 6.4 oz (81.829 kg) IBW/kg (Calculated) : 43.2  Vital Signs: Temp: 98.3 F (36.8 C) (11/22 1008) Temp src: Oral (11/22 1008) BP: 121/100 mmHg (11/22 1046) Pulse Rate: 60 (11/22 1046) Intake/Output from previous day: 11/21 0701 - 11/22 0700 In: 1785.8 [P.O.:960; I.V.:575.8; IV Piggyback:250] Out: 1350 [Urine:1350] Intake/Output from this shift: Total I/O In: 240 [P.O.:240] Out: -   Labs:  Recent Labs  10/13/13 0343 10/14/13 0427 10/15/13 0445  WBC 13.5* 13.5* 14.6*  HGB 10.6* 11.1* 10.9*  PLT 409* 409* 386  CREATININE 2.10* 1.97* 1.95*   Estimated Creatinine Clearance: 32.3 ml/min (by C-G formula based on Cr of 1.95). No results found for this basename: Rolm Gala, VANCORANDOM, GENTTROUGH, GENTPEAK, GENTRANDOM, TOBRATROUGH, TOBRAPEAK, TOBRARND, AMIKACINPEAK, AMIKACINTROU, AMIKACIN,  in the last 72 hours    Assessment: 26 yoF seen in the ED 11/15 w left breast pain after breast lump biospy performed 11/6 at [facility] positive for intraductal papilloma. Pt developed acute swelling, redness and has foul-smelling drainage from the nipple in the lateral left breast. Patient was given an Rx for abx from an outside hospital but was not able to afford the Rx and was seen back in the ED 11/17. Pt was admitted for IV antibiotics and I&D.   11/17 >> Vanc >> 11/19 11/17 >> Zosyn (MD) >> 11/22 11/20 >> PO doxy >> 1122 11/22 >> Ancef >>   Tmax: afeb WBCs: 14.6, up slowly Renal: SCr 1.95, CG 32  11/17 MRSA PCR >> negative 11/18 L breast abscess >> MSSA, no  anaerobics  11/22: Today is D#6 abx, D#1 Ancef for L breast abscess s/p I&D 11/18 and culture growing MSSA. WBC trending up slowly. ID on board. Scr slowly improving.    Plan:   Ancef 1g IV q8h  Pharmacy will f/u  Geoffry Paradise, PharmD, BCPS Pager: [phone] 12:55 PM Pharmacy #: [id number]

## 2013-10-15 NOTE — Progress Notes (Signed)
[facility] PROGRESS NOTE CONSULT  Tracie Estrada NFA:[id number] DOB: 06-08-1966 DOA: 10/10/2013 PCP: No PCP Per Patient  Assessment/Plan: Left Breast Abscess -S/p I and D. -Cx with staph: agree with doxy pending speciation. -Will DC zosyn as see no need for it at this point. -Management as per surgery.  HTN -Starting to trend downward with addition of norvasc and hydralazine, altho still elevated. -Avoid ACE-I/ARB given ARF. -Will increase hydralazine to 50 TID.  ARF -Cr 1.97 11/21. 1.95 11/22. -Likely 2/2 malignant HTN in setting of diuretic, ACE-I and probably vanc toxicity. -Continue IVF hydration. Change to saline at 100 cc/hr -Will order a renal US.  Code Status: Full Code Family Communication: Patient only  Disposition Plan: To be determined   Antibiotics:  Doxycycline   Subjective: Feeling better.  Objective: Filed Vitals:   10/15/13 0645 10/15/13 1008 10/15/13 1009 10/15/13 1046  BP: 200/95 212/100 201/105 121/100  Pulse: 67 60  60  Temp: 98.2 F (36.8 C) 98.3 F (36.8 C)    TempSrc: Oral Oral    Resp: 18 18    Height:      Weight:      SpO2: 97% 99%      Intake/Output Summary (Last 24 hours) at 10/15/13 1224 Last data filed at 10/15/13 1105  Gross per 24 hour  Intake 1417.5 ml  Output    800 ml  Net  617.5 ml   Filed Weights   10/10/13 1711 10/10/13 2002  Weight: 81.829 kg (180 lb 6.4 oz) 81.829 kg (180 lb 6.4 oz)    Exam:   General:  AA Ox3  Cardiovascular: RRR  Respiratory: CTA B  Abdomen: S/NT/ND/+BS  Extremities: no C/C/E/+pedal pulses   Neurologic:  Non-focal.  Data Reviewed: Basic Metabolic Panel:  Recent Labs Lab 10/08/13 2152 10/10/13 1706 10/13/13 0343 10/14/13 0427 10/15/13 0445  NA 134*  --  137 135 133*  K 3.1*  --  3.2* 3.5 3.1*  CL 98  --  96 97 94*  CO2 27  --  29 31 27   GLUCOSE 82  --  119* 124* 92  BUN 8  --  20 17 19   CREATININE 0.65 0.79 2.10* 1.97* 1.95*  CALCIUM 9.2  --  8.9 9.1 9.0    Liver Function Tests: No results found for this basename: AST, ALT, ALKPHOS, BILITOT, PROT, ALBUMIN,  in the last 168 hours No results found for this basename: LIPASE, AMYLASE,  in the last 168 hours No results found for this basename: AMMONIA,  in the last 168 hours CBC:  Recent Labs Lab 10/08/13 2152 10/10/13 1706 10/13/13 0343 10/14/13 0427 10/15/13 0445  WBC 16.1* 12.5* 13.5* 13.5* 14.6*  NEUTROABS 12.2*  --   --   --   --   HGB 12.1 11.1* 10.6* 11.1* 10.9*  HCT 34.9* 32.3* 31.7* 32.9* 32.8*  MCV 81.9 81.6 82.1 82.7 82.6  PLT 464* 445* 409* 409* 386   Cardiac Enzymes:  Recent Labs Lab 10/08/13 2152  TROPONINI <0.30   BNP (last 3 results) No results found for this basename: PROBNP,  in the last 8760 hours CBG: No results found for this basename: GLUCAP,  in the last 168 hours  Recent Results (from the past 240 hour(s))  MRSA PCR SCREENING     Status: None   Collection Time    10/10/13  4:50 PM      Result Value Range Status   MRSA by PCR NEGATIVE  NEGATIVE Final   Comment:  The GeneXpert MRSA Assay (FDA     approved for NASAL specimens     only), is one component of a     comprehensive MRSA colonization     surveillance program. It is not     intended to diagnose MRSA     infection nor to guide or     monitor treatment for     MRSA infections.  ANAEROBIC CULTURE     Status: None   Collection Time    10/11/13 12:49 PM      Result Value Range Status   Specimen Description ABSCESS LT BREAST   Final   Special Requests PATIENT ON FOLLOWING ZOSYN   Final   Gram Stain     Final   Value: RARE WBC PRESENT, PREDOMINANTLY PMN     NO SQUAMOUS EPITHELIAL CELLS SEEN     NO ORGANISMS SEEN     Performed at [facility]   Culture     Final   Value: NO ANAEROBES ISOLATED; CULTURE IN PROGRESS FOR 5 DAYS     Performed at [facility]   Report Status PENDING   Incomplete  CULTURE, ROUTINE-ABSCESS     Status: None   Collection Time     10/11/13 12:49 PM      Result Value Range Status   Specimen Description ABSCESS LT BREAST   Final   Special Requests PATIENT ON FOLLOWING ZOSYN   Final   Gram Stain     Final   Value: RARE WBC PRESENT, PREDOMINANTLY PMN     NO SQUAMOUS EPITHELIAL CELLS SEEN     NO ORGANISMS SEEN     Performed at [facility]   Culture     Final   Value: FEW STAPHYLOCOCCUS AUREUS     Note: RIFAMPIN AND GENTAMICIN SHOULD NOT BE USED AS SINGLE DRUGS FOR TREATMENT OF STAPH INFECTIONS.     Performed at [facility]   Report Status 10/14/2013 FINAL   Final   Organism ID, Bacteria STAPHYLOCOCCUS AUREUS   Final     Studies: No results found.  Scheduled Meds: . amLODipine  10 mg Oral Daily  . enoxaparin (LOVENOX) injection  40 mg Subcutaneous Q24H  . hydrALAZINE  50 mg Oral Q8H  . pantoprazole  20 mg Oral Daily  . polyethylene glycol  17 g Oral Daily   Continuous Infusions: . sodium chloride 100 mL/hr (10/15/13 1056)    Principal Problem:   Breast mass, left Active Problems:   SMOKER   Malignant hypertension   Acute renal failure   Hypokalemia    Time spent: 35 minutes    [name],[name]  [facility] Pager [phone]  If 7PM-7AM, please contact night-coverage at [email or web address], password [facility] 10/15/2013, 12:24 PM  LOS: 5 days

## 2013-10-15 NOTE — Progress Notes (Signed)
4 Days Post-Op  Subjective: No breast pain.  Objective: Vital signs in last 24 hours: Temp:  [98.2 F (36.8 C)-99 F (37.2 C)] 98.2 F (36.8 C) (11/22 0645) Pulse Rate:  [67-88] 67 (11/22 0645) Resp:  [18-20] 18 (11/22 0645) BP: (174-210)/(78-98) 200/95 mmHg (11/22 0645) SpO2:  [97 %-100 %] 97 % (11/22 0645) Last BM Date: 10/09/13  Intake/Output from previous day: 11/21 0701 - 11/22 0700 In: 1785.8 [P.O.:960; I.V.:575.8; IV Piggyback:250] Out: 1350 [Urine:1350] Intake/Output this shift:    PE: General- In NAD Left breast-still with some induration, no new areas of fluctuance, drain fell out, wound probed digitally and no abscess pockets found  Lab Results:   Recent Labs  10/14/13 0427 10/15/13 0445  WBC 13.5* 14.6*  HGB 11.1* 10.9*  HCT 32.9* 32.8*  PLT 409* 386   BMET  Recent Labs  10/14/13 0427 10/15/13 0445  NA 135 133*  K 3.5 3.1*  CL 97 94*  CO2 31 27  GLUCOSE 124* 92  BUN 17 19  CREATININE 1.97* 1.95*  CALCIUM 9.1 9.0   PT/INR No results found for this basename: LABPROT, INR,  in the last 72 hours Comprehensive Metabolic Panel:    Component Value Date/Time   NA 133* 10/15/2013 0445   K 3.1* 10/15/2013 0445   CL 94* 10/15/2013 0445   CO2 27 10/15/2013 0445   BUN 19 10/15/2013 0445   CREATININE 1.95* 10/15/2013 0445   GLUCOSE 92 10/15/2013 0445   CALCIUM 9.0 10/15/2013 0445     Studies/Results: Final culture:  Pan sensitive staph aureus  Anti-infectives: Anti-infectives   Start     Dose/Rate Route Frequency Ordered Stop   10/13/13 1000  doxycycline (VIBRA-TABS) tablet 100 mg     100 mg Oral Every 12 hours 10/13/13 0712     10/12/13 2200  vancomycin (VANCOCIN) IVPB 1000 mg/200 mL premix  Status:  Discontinued     1,000 mg 200 mL/hr over 60 Minutes Intravenous Every 12 hours 10/12/13 1114 10/13/13 0711   10/11/13 0200  vancomycin (VANCOCIN) IVPB 1000 mg/200 mL premix  Status:  Discontinued     1,000 mg 200 mL/hr over 60 Minutes  Intravenous Every 8 hours 10/10/13 1629 10/12/13 1100   10/10/13 1800  vancomycin (VANCOCIN) 1,500 mg in sodium chloride 0.9 % 500 mL IVPB     1,500 mg 250 mL/hr over 120 Minutes Intravenous  Once 10/10/13 1629 10/10/13 1924   10/10/13 1700  piperacillin-tazobactam (ZOSYN) IVPB 3.375 g     3.375 g 12.5 mL/hr over 240 Minutes Intravenous Every 8 hours 10/10/13 1556     10/10/13 1600  ceFAZolin (ANCEF) IVPB 2 g/50 mL premix  Status:  Discontinued     2 g 100 mL/hr over 30 Minutes Intravenous Every 8 hours 10/10/13 1525 10/10/13 1556      Assessment Principal Problem:   Complex left breast abscess s/p I & D-WBC slowly rising; no clinical evidence of recurrent infection; has been on appropriate abxs      Vancomycin nephrotoxicity-Cr decreasing very slowly     Poorly controlled hypertension   LOS: 5 days   Plan: NS wet to dry dressing changes BID.  ID consult.  HTN management per medicine.  Monitor WBC and Cr.   Janiyla Long J 10/15/2013

## 2013-10-15 NOTE — Consult Note (Signed)
[facility]    Date of Admission:  10/10/2013  Date of Consult:  10/15/2013  Reason for Consult:Staphylococcus Aureus breast abscess with persistent elevated WBC Referring Physician: Dr. Abbey Chatters    HPI: Tracie Estrada is an 47 y.o. 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 Abbey Chatters on 10/11/13. Pt had been on vancomycin and zosyn and has had reassuring exam by Dr. Abbey Chatters at the operative site and been afebrile but with persistently elevated wbc to 14.6k yesterday.  I was consulted to assist in workup for persistent leukocytosis.    Past Medical History  Diagnosis Date  . Hypertension   . Malignant hypertension 10/13/2013  . Acute renal failure 10/13/2013    On Vancomycin  . Breast abscess of female     Status post biopsy    Past Surgical History  Procedure Laterality Date  . Biopsy of left breast    . Incision and drainage abscess Left 10/11/2013    Procedure: INCISION AND DRAINAGE and debridement LEFT BREAST ABSCESS;  Surgeon: Adolph Pollack, MD;  Location: [facility];  Service: General;  Laterality: Left;  ergies:   Allergies  Allergen Reactions  . Fish Allergy Anaphylaxis  . Peanuts [Peanut Oil] Anaphylaxis  . Other Other (See Comments)    Anesthesia.  Specific agent unknown.  Reaction unknown.   . Vancomycin Other (See Comments)    Nephrotoxicity     Medications: I have reviewed patients current medications as documented in Epic Anti-infectives   Start     Dose/Rate Route Frequency Ordered Stop   10/15/13 1400  ceFAZolin (ANCEF) IVPB 1 g/50 mL premix     1 g 100 mL/hr over 30 Minutes Intravenous 3 times per day 10/15/13 1256     10/13/13 1000  doxycycline (VIBRA-TABS) tablet 100 mg  Status:  Discontinued     100 mg Oral Every 12 hours 10/13/13 0712  10/15/13 0856   10/12/13 2200  vancomycin (VANCOCIN) IVPB 1000 mg/200 mL premix  Status:  Discontinued     1,000 mg 200 mL/hr over 60 Minutes Intravenous Every 12 hours 10/12/13 1114 10/13/13 0711   10/11/13 0200  vancomycin (VANCOCIN) IVPB 1000 mg/200 mL premix  Status:  Discontinued     1,000 mg 200 mL/hr over 60 Minutes Intravenous Every 8 hours 10/10/13 1629 10/12/13 1100   10/10/13 1800  vancomycin (VANCOCIN) 1,500 mg in sodium chloride 0.9 % 500 mL IVPB     1,500 mg 250 mL/hr over 120 Minutes Intravenous  Once 10/10/13 1629 10/10/13 1924   10/10/13 1700  piperacillin-tazobactam (ZOSYN) IVPB 3.375 g  Status:  Discontinued     3.375 g 12.5 mL/hr over 240 Minutes Intravenous Every 8 hours 10/10/13 1556 10/15/13 1053   10/10/13 1600  ceFAZolin (ANCEF) IVPB 2 g/50 mL premix  Status:  Discontinued     2 g 100 mL/hr over 30 Minutes Intravenous Every 8 hours 10/10/13 1525 10/10/13 1556      Social History:  reports that she has been smoking Cigarettes.  She has a 26 pack-year smoking history. She has never used smokeless tobacco. She reports that she does not drink alcohol or use illicit drugs.  Family History  Problem Relation Age of Onset  . Breast cancer Mother   . Cancer Mother     brain  .  Hypertension Father   . Thyroid disease Daughter     As in HPI and primary teams notes otherwise 12 point review of systems is negative  Blood pressure 180/90, pulse 68, temperature 98.7 F (37.1 C), temperature source Oral, resp. rate 18, height 4\' 11"  (1.499 m), weight 180 lb 6.4 oz (81.829 kg), last menstrual period 08/24/2013, SpO2 100.00%. General: Alert and awake, oriented x3, not in any acute distress. HEENT: anicteric sclera, pupils reactive to light and accommodation, EOMI, oropharynx clear and without exudate CVS regular rate, normal r,  no murmur rubs or gallops Chest: clear to auscultation bilaterally, no wheezing, rales or rhonchi Abdomen: soft nontender, nondistended, normal  bowel sounds, Extremities: no  clubbing or edema noted bilaterally Skin: left breast with packing in place, she did not wish for me to examine the wound today. She has some tenderness around this. Neuro: nonfocal, strength and sensation intact   Results for orders placed during the hospital encounter of 10/10/13 (from the past 48 hour(s))  URINALYSIS, ROUTINE W REFLEX MICROSCOPIC     Status: None   Collection Time    10/13/13  3:26 PM      Result Value Range   Color, Urine YELLOW  YELLOW   APPearance CLEAR  CLEAR   Specific Gravity, Urine 1.013  1.005 - 1.030   pH 7.0  5.0 - 8.0   Glucose, UA NEGATIVE  NEGATIVE mg/dL   Hgb urine dipstick NEGATIVE  NEGATIVE   Bilirubin Urine NEGATIVE  NEGATIVE   Ketones, ur NEGATIVE  NEGATIVE mg/dL   Protein, ur NEGATIVE  NEGATIVE mg/dL   Urobilinogen, UA 0.2  0.0 - 1.0 mg/dL   Nitrite NEGATIVE  NEGATIVE   Leukocytes, UA NEGATIVE  NEGATIVE   Comment: MICROSCOPIC NOT DONE ON URINES WITH NEGATIVE PROTEIN, BLOOD, LEUKOCYTES, NITRITE, OR GLUCOSE <1000 mg/dL.  CBC     Status: Abnormal   Collection Time    10/14/13  4:27 AM      Result Value Range   WBC 13.5 (*) 4.0 - 10.5 K/uL   RBC 3.98  3.87 - 5.11 MIL/uL   Hemoglobin 11.1 (*) 12.0 - 15.0 g/dL   HCT 78.4 (*) 69.6 - 29.5 %   MCV 82.7  78.0 - 100.0 fL   MCH 27.9  26.0 - 34.0 pg   MCHC 33.7  30.0 - 36.0 g/dL   RDW 28.4  13.2 - 44.0 %   Platelets 409 (*) 150 - 400 K/uL  BASIC METABOLIC PANEL     Status: Abnormal   Collection Time    10/14/13  4:27 AM      Result Value Range   Sodium 135  135 - 145 mEq/L   Potassium 3.5  3.5 - 5.1 mEq/L   Chloride 97  96 - 112 mEq/L   CO2 31  19 - 32 mEq/L   Glucose, Bld 124 (*) 70 - 99 mg/dL   BUN 17  6 - 23 mg/dL   Creatinine, Ser 1.02 (*) 0.50 - 1.10 mg/dL   Calcium 9.1  8.4 - 72.5 mg/dL   GFR calc non Af Amer 29 (*) >90 mL/min   GFR calc Af Amer 33 (*) >90 mL/min   Comment: (NOTE)     The eGFR has been calculated using the CKD EPI equation.     This  calculation has not been validated in all clinical situations.     eGFR's persistently <90 mL/min signify possible Chronic Kidney     Disease.  CBC  Status: Abnormal   Collection Time    10/15/13  4:45 AM      Result Value Range   WBC 14.6 (*) 4.0 - 10.5 K/uL   RBC 3.97  3.87 - 5.11 MIL/uL   Hemoglobin 10.9 (*) 12.0 - 15.0 g/dL   HCT 16.1 (*) 09.6 - 04.5 %   MCV 82.6  78.0 - 100.0 fL   MCH 27.5  26.0 - 34.0 pg   MCHC 33.2  30.0 - 36.0 g/dL   RDW 40.9  81.1 - 91.4 %   Platelets 386  150 - 400 K/uL  BASIC METABOLIC PANEL     Status: Abnormal   Collection Time    10/15/13  4:45 AM      Result Value Range   Sodium 133 (*) 135 - 145 mEq/L   Potassium 3.1 (*) 3.5 - 5.1 mEq/L   Chloride 94 (*) 96 - 112 mEq/L   CO2 27  19 - 32 mEq/L   Glucose, Bld 92  70 - 99 mg/dL   BUN 19  6 - 23 mg/dL   Creatinine, Ser 7.82 (*) 0.50 - 1.10 mg/dL   Calcium 9.0  8.4 - 95.6 mg/dL   GFR calc non Af Amer 29 (*) >90 mL/min   GFR calc Af Amer 34 (*) >90 mL/min   Comment: (NOTE)     The eGFR has been calculated using the CKD EPI equation.     This calculation has not been validated in all clinical situations.     eGFR's persistently <90 mL/min signify possible Chronic Kidney     Disease.      Component Value Date/Time   SDES ABSCESS LT BREAST 10/11/2013 1249   SDES ABSCESS LT BREAST 10/11/2013 1249   SPECREQUEST PATIENT ON FOLLOWING ZOSYN 10/11/2013 1249   SPECREQUEST PATIENT ON FOLLOWING ZOSYN 10/11/2013 1249   CULT  Value: NO ANAEROBES ISOLATED; CULTURE IN PROGRESS FOR 5 DAYS Performed at [facility] 10/11/2013 1249   CULT  Value: FEW STAPHYLOCOCCUS AUREUS Note: RIFAMPIN AND GENTAMICIN SHOULD NOT BE USED AS SINGLE DRUGS FOR TREATMENT OF STAPH INFECTIONS. Performed at [facility] 10/11/2013 1249   REPTSTATUS PENDING 10/11/2013 1249   REPTSTATUS 10/14/2013 FINAL 10/11/2013 1249   No results found.   Recent Results (from the past 720 hour(s))  MRSA PCR SCREENING     Status:  None   Collection Time    10/10/13  4:50 PM      Result Value Range Status   MRSA by PCR NEGATIVE  NEGATIVE Final   Comment:            The GeneXpert MRSA Assay (FDA     approved for NASAL specimens     only), is one component of a     comprehensive MRSA colonization     surveillance program. It is not     intended to diagnose MRSA     infection nor to guide or     monitor treatment for     MRSA infections.  ANAEROBIC CULTURE     Status: None   Collection Time    10/11/13 12:49 PM      Result Value Range Status   Specimen Description ABSCESS LT BREAST   Final   Special Requests PATIENT ON FOLLOWING ZOSYN   Final   Gram Stain     Final   Value: RARE WBC PRESENT, PREDOMINANTLY PMN     NO SQUAMOUS EPITHELIAL CELLS SEEN     NO ORGANISMS  SEEN     Performed at [facility]     Final   Value: NO ANAEROBES ISOLATED; CULTURE IN PROGRESS FOR 5 DAYS     Performed at [facility]   Report Status PENDING   Incomplete  CULTURE, ROUTINE-ABSCESS     Status: None   Collection Time    10/11/13 12:49 PM      Result Value Range Status   Specimen Description ABSCESS LT BREAST   Final   Special Requests PATIENT ON FOLLOWING ZOSYN   Final   Gram Stain     Final   Value: RARE WBC PRESENT, PREDOMINANTLY PMN     NO SQUAMOUS EPITHELIAL CELLS SEEN     NO ORGANISMS SEEN     Performed at [facility]   Culture     Final   Value: FEW STAPHYLOCOCCUS AUREUS     Note: RIFAMPIN AND GENTAMICIN SHOULD NOT BE USED AS SINGLE DRUGS FOR TREATMENT OF STAPH INFECTIONS.     Performed at [facility]   Report Status 10/14/2013 FINAL   Final   Organism ID, Bacteria STAPHYLOCOCCUS AUREUS   Final     Impression/Recommendation  47 yo with SA complicated breast abscess and persistent leukocytosis:  #1 Persistently elevated wbc: baseline was normal in March, I will check [place] to look for any other residual abscess that might require drainage  #2 BReast abscess: duet to  MSSA Narrow to Ancef, main concern is if there is an undrained abscess not obvious to exam  #3 ARF: may have a bit of CKD as well due to poorly controlled HTN. No longer on vanco  #4 Screening: check HIV and hep panel  Thank you so much for this interesting consult  [facility] [phone] (pager) [phone] (office) 10/15/2013, 2:43 PM  Paulette Blanch Dam 10/15/2013, 2:43 PM

## 2013-10-16 DIAGNOSIS — F411 Generalized anxiety disorder: Secondary | ICD-10-CM

## 2013-10-16 LAB — HEPATITIS PANEL, ACUTE
HCV Ab: NEGATIVE
Hep A IgM: NONREACTIVE
Hepatitis B Surface Ag: NEGATIVE

## 2013-10-16 LAB — CBC
MCHC: 33.4 g/dL (ref 30.0–36.0)
MCV: 82.3 fL (ref 78.0–100.0)
Platelets: 371 10*3/uL (ref 150–400)
RBC: 3.85 MIL/uL — ABNORMAL LOW (ref 3.87–5.11)
RDW: 15.1 % (ref 11.5–15.5)
WBC: 12.5 10*3/uL — ABNORMAL HIGH (ref 4.0–10.5)

## 2013-10-16 LAB — BASIC METABOLIC PANEL
CO2: 25 mEq/L (ref 19–32)
Chloride: 100 mEq/L (ref 96–112)
Creatinine, Ser: 1.63 mg/dL — ABNORMAL HIGH (ref 0.50–1.10)
GFR calc Af Amer: 42 mL/min — ABNORMAL LOW (ref >90)
Potassium: 3.4 mEq/L — ABNORMAL LOW (ref 3.5–5.1)
Sodium: 136 mEq/L (ref 135–145)

## 2013-10-16 LAB — ANAEROBIC CULTURE

## 2013-10-16 LAB — HIV ANTIBODY (ROUTINE TESTING W REFLEX): HIV: NONREACTIVE

## 2013-10-16 MED ORDER — DOXYCYCLINE HYCLATE 100 MG PO TABS
100.0000 mg | ORAL_TABLET | Freq: Two times a day (BID) | ORAL | Status: DC
  Filled 2013-10-16 (×2): qty 1

## 2013-10-16 MED ORDER — HYDRALAZINE HCL 50 MG PO TABS: 50.0000 mg | ORAL_TABLET | Freq: Three times a day (TID) | ORAL | Status: DC

## 2013-10-16 MED ORDER — DOXYCYCLINE HYCLATE 100 MG PO TABS: 100.0000 mg | ORAL_TABLET | Freq: Two times a day (BID) | ORAL | Status: DC

## 2013-10-16 MED ORDER — CEPHALEXIN 500 MG PO CAPS: 500.0000 mg | ORAL_CAPSULE | Freq: Three times a day (TID) | ORAL | Status: DC

## 2013-10-16 MED ORDER — CEPHALEXIN 500 MG PO CAPS
500.0000 mg | ORAL_CAPSULE | Freq: Four times a day (QID) | ORAL | Status: DC
  Administered 2013-10-16 – 2013-10-17 (×5): 500 mg via ORAL
  Filled 2013-10-16 (×9): qty 1

## 2013-10-16 MED ORDER — POLYETHYLENE GLYCOL 3350 17 G PO PACK: 17.0000 g | PACK | Freq: Every day | ORAL | Status: DC

## 2013-10-16 MED ORDER — OXYCODONE-ACETAMINOPHEN 5-325 MG PO TABS: 1.0000 | ORAL_TABLET | ORAL | Status: DC | PRN

## 2013-10-16 MED ORDER — AMLODIPINE BESYLATE 10 MG PO TABS: 10.0000 mg | ORAL_TABLET | Freq: Every day | ORAL | Status: DC

## 2013-10-16 NOTE — Progress Notes (Signed)
[facility] PROGRESS NOTE CONSULT  Tracie Estrada ZOX:[id number] DOB: 06-16-1966 DOA: 10/10/2013 PCP: No PCP Per Patient  Assessment/Plan: Left Breast Abscess -S/p I and D. -Will DC zosyn as see no need for it at this point. -Management as per surgery/ID. -ID has transition antibiotics to Ancef. -Breast ultrasound pending to see if any further I&D needs to occur prior to discharge given persistently elevated white blood cell count.  HTN -Starting to trend downward with addition of norvasc and hydralazine, altho still elevated. -Avoid ACE-I/ARB given ARF. -Will increase hydralazine to 50 TID.  ARF -Cr 1.97 11/21. 1.95 11/22, 1.63 on 11/23. -Likely 2/2 malignant HTN in setting of diuretic, ACE-I and probably vanc toxicity. -Continue IVF hydration. Change to saline at 100 cc/hr   Code Status: Full Code Family Communication: Patient only  Disposition Plan: To be determined   Antibiotics:  Ancef  Subjective: Feeling better.  Objective: Filed Vitals:   10/15/13 1516 10/15/13 2111 10/16/13 0636 10/16/13 1341  BP: 170/90 186/80 172/82 192/83  Pulse:  66 68 75  Temp:  98.6 F (37 C) 98.7 F (37.1 C) 97.9 F (36.6 C)  TempSrc:  Oral Oral Oral  Resp:  18 18 18   Height:      Weight:      SpO2:  100% 100% 100%    Intake/Output Summary (Last 24 hours) at 10/16/13 1454 Last data filed at 10/16/13 1342  Gross per 24 hour  Intake   2675 ml  Output   1000 ml  Net   1675 ml   Filed Weights   10/10/13 1711 10/10/13 2002  Weight: 81.829 kg (180 lb 6.4 oz) 81.829 kg (180 lb 6.4 oz)    Exam:   General:  AA Ox3  Cardiovascular: RRR  Respiratory: CTA B  Abdomen: S/NT/ND/+BS  Extremities: no C/C/E/+pedal pulses   Neurologic:  Non-focal.  Data Reviewed: Basic Metabolic Panel:  Recent Labs Lab 10/10/13 1706 10/13/13 0343 10/14/13 0427 10/15/13 0445 10/16/13 0458  NA  --  137 135 133* 136  K  --  3.2* 3.5 3.1* 3.4*  CL  --  96 97 94* 100  CO2  --   29 31 27 25   GLUCOSE  --  119* 124* 92 91  BUN  --  20 17 19 16   CREATININE 0.79 2.10* 1.97* 1.95* 1.63*  CALCIUM  --  8.9 9.1 9.0 8.7   Liver Function Tests: No results found for this basename: AST, ALT, ALKPHOS, BILITOT, PROT, ALBUMIN,  in the last 168 hours No results found for this basename: LIPASE, AMYLASE,  in the last 168 hours No results found for this basename: AMMONIA,  in the last 168 hours CBC:  Recent Labs Lab 10/10/13 1706 10/13/13 0343 10/14/13 0427 10/15/13 0445 10/16/13 0458  WBC 12.5* 13.5* 13.5* 14.6* 12.5*  HGB 11.1* 10.6* 11.1* 10.9* 10.6*  HCT 32.3* 31.7* 32.9* 32.8* 31.7*  MCV 81.6 82.1 82.7 82.6 82.3  PLT 445* 409* 409* 386 371   Cardiac Enzymes: No results found for this basename: CKTOTAL, CKMB, CKMBINDEX, TROPONINI,  in the last 168 hours BNP (last 3 results) No results found for this basename: PROBNP,  in the last 8760 hours CBG: No results found for this basename: GLUCAP,  in the last 168 hours  Recent Results (from the past 240 hour(s))  MRSA PCR SCREENING     Status: None   Collection Time    10/10/13  4:50 PM      Result Value Range Status  MRSA by PCR NEGATIVE  NEGATIVE Final   Comment:            The GeneXpert MRSA Assay (FDA     approved for NASAL specimens     only), is one component of a     comprehensive MRSA colonization     surveillance program. It is not     intended to diagnose MRSA     infection nor to guide or     monitor treatment for     MRSA infections.  ANAEROBIC CULTURE     Status: None   Collection Time    10/11/13 12:49 PM      Result Value Range Status   Specimen Description ABSCESS LT BREAST   Final   Special Requests PATIENT ON FOLLOWING ZOSYN   Final   Gram Stain     Final   Value: RARE WBC PRESENT, PREDOMINANTLY PMN     NO SQUAMOUS EPITHELIAL CELLS SEEN     NO ORGANISMS SEEN     Performed at [facility]   Culture     Final   Value: NO ANAEROBES ISOLATED     Performed at [facility]    Report Status 10/16/2013 FINAL   Final  CULTURE, ROUTINE-ABSCESS     Status: None   Collection Time    10/11/13 12:49 PM      Result Value Range Status   Specimen Description ABSCESS LT BREAST   Final   Special Requests PATIENT ON FOLLOWING ZOSYN   Final   Gram Stain     Final   Value: RARE WBC PRESENT, PREDOMINANTLY PMN     NO SQUAMOUS EPITHELIAL CELLS SEEN     NO ORGANISMS SEEN     Performed at [facility]   Culture     Final   Value: FEW STAPHYLOCOCCUS AUREUS     Note: RIFAMPIN AND GENTAMICIN SHOULD NOT BE USED AS SINGLE DRUGS FOR TREATMENT OF STAPH INFECTIONS.     Performed at [facility]   Report Status 10/14/2013 FINAL   Final   Organism ID, Bacteria STAPHYLOCOCCUS AUREUS   Final     Studies: US Renal  10/15/2013   CLINICAL DATA:  Acute renal failure  EXAM: RENAL/URINARY TRACT ULTRASOUND COMPLETE  COMPARISON:  CT 02/18/2013  FINDINGS: Right Kidney  Length: 11.7 cm. Increased echotexture throughout the right kidney. No hydronephrosis or focal abnormality.  Left Kidney  Length: 11.6 cm. Increased echotexture throughout the left kidney. No hydronephrosis or focal abnormality.  Bladder  Appears normal for degree of bladder distention.  IMPRESSION: Increased echotexture compatible with chronic medical renal disease. No hydronephrosis.   Electronically Signed   By: Charlett Nose M.D.   On: 10/15/2013 16:22    Scheduled Meds: . amLODipine  10 mg Oral Daily  . cephALEXin  500 mg Oral Q6H  . enoxaparin (LOVENOX) injection  40 mg Subcutaneous Q24H  . hydrALAZINE  50 mg Oral Q8H  . polyethylene glycol  17 g Oral Daily   Continuous Infusions:    Principal Problem:   Breast mass, left Active Problems:   SMOKER   Malignant hypertension   Acute renal failure   Hypokalemia    Time spent: 35 minutes    Tracie Estrada  [facility] Pager [phone]  If 7PM-7AM, please contact night-coverage at [email or web address], password [facility] 10/16/2013, 2:54 PM   LOS: 6 days

## 2013-10-16 NOTE — Progress Notes (Signed)
[facility]  Day # 2 ancef 6 days prior zosyn 1 day vancomycin prev  Subjective: No new complaints   Antibiotics:  Anti-infectives   Start     Dose/Rate Route Frequency Ordered Stop   10/16/13 1200  cephALEXin (KEFLEX) capsule 500 mg     500 mg Oral 4 times per day 10/16/13 1011 10/26/13 1159   10/16/13 1000  doxycycline (VIBRA-TABS) tablet 100 mg  Status:  Discontinued     100 mg Oral Every 12 hours 10/16/13 0940 10/16/13 1011   10/16/13 0000  doxycycline (VIBRA-TABS) 100 MG tablet  Status:  Discontinued     100 mg Oral Every 12 hours 10/16/13 0942 10/16/13    10/16/13 0000  cephALEXin (KEFLEX) 500 MG capsule     500 mg Oral 3 times daily 10/16/13 1012     10/15/13 1400  ceFAZolin (ANCEF) IVPB 1 g/50 mL premix  Status:  Discontinued     1 g 100 mL/hr over 30 Minutes Intravenous 3 times per day 10/15/13 1256 10/16/13 0940   10/13/13 1000  doxycycline (VIBRA-TABS) tablet 100 mg  Status:  Discontinued     100 mg Oral Every 12 hours 10/13/13 0712 10/15/13 0856   10/12/13 2200  vancomycin (VANCOCIN) IVPB 1000 mg/200 mL premix  Status:  Discontinued     1,000 mg 200 mL/hr over 60 Minutes Intravenous Every 12 hours 10/12/13 1114 10/13/13 0711   10/11/13 0200  vancomycin (VANCOCIN) IVPB 1000 mg/200 mL premix  Status:  Discontinued     1,000 mg 200 mL/hr over 60 Minutes Intravenous Every 8 hours 10/10/13 1629 10/12/13 1100   10/10/13 1800  vancomycin (VANCOCIN) 1,500 mg in sodium chloride 0.9 % 500 mL IVPB     1,500 mg 250 mL/hr over 120 Minutes Intravenous  Once 10/10/13 1629 10/10/13 1924   10/10/13 1700  piperacillin-tazobactam (ZOSYN) IVPB 3.375 g  Status:  Discontinued     3.375 g 12.5 mL/hr over 240 Minutes Intravenous Every 8 hours 10/10/13 1556 10/15/13 1053   10/10/13 1600  ceFAZolin (ANCEF) IVPB 2 g/50 mL premix  Status:  Discontinued     2 g 100 mL/hr over 30 Minutes Intravenous Every 8 hours 10/10/13 1525 10/10/13 1556       Medications: Scheduled Meds: . amLODipine  10 mg Oral Daily  . cephALEXin  500 mg Oral Q6H  . enoxaparin (LOVENOX) injection  40 mg Subcutaneous Q24H  . hydrALAZINE  50 mg Oral Q8H  . polyethylene glycol  17 g Oral Daily   Continuous Infusions:  PRN Meds:.acetaminophen, acetaminophen, hydrALAZINE, HYDROmorphone (DILAUDID) injection, ondansetron (ZOFRAN) IV, oxyCODONE, oxyCODONE-acetaminophen   Objective: Weight change:   Intake/Output Summary (Last 24 hours) at 10/16/13 1324 Last data filed at 10/16/13 1000  Gross per 24 hour  Intake 2456.67 ml  Output   1000 ml  Net 1456.67 ml   Blood pressure 172/82, pulse 68, temperature 98.7 F (37.1 C), temperature source Oral, resp. rate 18, height 4\' 11"  (1.499 m), weight 180 lb 6.4 oz (81.829 kg), last menstrual period 08/24/2013, SpO2 100.00%. Temp:  [98.6 F (37 C)-98.7 F (37.1 C)] 98.7 F (37.1 C) (11/23 0636) Pulse Rate:  [66-68] 68 (11/23 0636) Resp:  [18] 18 (11/23 0636) BP: (170-186)/(80-90) 172/82 mmHg (11/23 0636) SpO2:  [100 %] 100 % (11/23 0636)  Physical Exam: General: Alert and awake, oriented x3, not in any acute distress.  HEENT: anicteric sclera, pupils reactive to light and accommodation, EOMI, oropharynx clear and without exudate  CVS regular rate, normal r, no murmur rubs or gallops  Chest: clear to auscultation bilaterally, no wheezing, rales or rhonchi  Abdomen: soft nontender, nondistended, normal bowel sounds,  Extremities: no clubbing or edema noted bilaterally  Skin: did not examine breast per her request today Neuro: nonfocal, strength and sensation intact  Lab Results:  Recent Labs  10/15/13 0445 10/16/13 0458  WBC 14.6* 12.5*  HGB 10.9* 10.6*  HCT 32.8* 31.7*  PLT 386 371    BMET  Recent Labs  10/15/13 0445 10/16/13 0458  NA 133* 136  K 3.1* 3.4*  CL 94* 100  CO2 27 25  GLUCOSE 92 91  BUN 19 16  CREATININE 1.95* 1.63*  CALCIUM 9.0 8.7    Micro Results: Recent Results  (from the past 240 hour(s))  MRSA PCR SCREENING     Status: None   Collection Time    10/10/13  4:50 PM      Result Value Range Status   MRSA by PCR NEGATIVE  NEGATIVE Final   Comment:            The GeneXpert MRSA Assay (FDA     approved for NASAL specimens     only), is one component of a     comprehensive MRSA colonization     surveillance program. It is not     intended to diagnose MRSA     infection nor to guide or     monitor treatment for     MRSA infections.  ANAEROBIC CULTURE     Status: None   Collection Time    10/11/13 12:49 PM      Result Value Range Status   Specimen Description ABSCESS LT BREAST   Final   Special Requests PATIENT ON FOLLOWING ZOSYN   Final   Gram Stain     Final   Value: RARE WBC PRESENT, PREDOMINANTLY PMN     NO SQUAMOUS EPITHELIAL CELLS SEEN     NO ORGANISMS SEEN     Performed at [facility]   Culture     Final   Value: NO ANAEROBES ISOLATED     Performed at [facility]   Report Status 10/16/2013 FINAL   Final  CULTURE, ROUTINE-ABSCESS     Status: None   Collection Time    10/11/13 12:49 PM      Result Value Range Status   Specimen Description ABSCESS LT BREAST   Final   Special Requests PATIENT ON FOLLOWING ZOSYN   Final   Gram Stain     Final   Value: RARE WBC PRESENT, PREDOMINANTLY PMN     NO SQUAMOUS EPITHELIAL CELLS SEEN     NO ORGANISMS SEEN     Performed at [facility]   Culture     Final   Value: FEW STAPHYLOCOCCUS AUREUS     Note: RIFAMPIN AND GENTAMICIN SHOULD NOT BE USED AS SINGLE DRUGS FOR TREATMENT OF STAPH INFECTIONS.     Performed at [facility]   Report Status 10/14/2013 FINAL   Final   Organism ID, Bacteria STAPHYLOCOCCUS AUREUS   Final    Studies/Results: US Renal  10/15/2013   CLINICAL DATA:  Acute renal failure  EXAM: RENAL/URINARY TRACT ULTRASOUND COMPLETE  COMPARISON:  CT 02/18/2013  FINDINGS: Right Kidney  Length: 11.7 cm. Increased echotexture throughout the right kidney.  No hydronephrosis or focal abnormality.  Left Kidney  Length: 11.6 cm. Increased echotexture throughout the left kidney. No hydronephrosis or focal abnormality.  Bladder  Appears normal for degree of bladder distention.  IMPRESSION: Increased echotexture compatible with chronic medical renal disease. No hydronephrosis.   Electronically Signed   By: Charlett Nose M.D.   On: 10/15/2013 16:22      Assessment/Plan: Tracie Estrada is a 47 y.o. female with  complicated breast abscess duet o Staph Aureus S to Methicillin  and persistent leukocytosis:   #1 Persistently elevated wbc: baseline was normal in March,  --[place] ordered and done but Radiology will not read until Monday??   #2 BReast abscess: duet to MSSA Narrow to Ancef, main concern is if there is an undrained abscess not obvious to exam   --IF [place] negative for new abscess that needs surgery, would change her to keflex 500 mg q 8 hours for another 10 days   #3 ARF: may have a bit of CKD as well due to poorly controlled HTN. No longer on vanco   #4 Screening: check HIV and hep panel  She can followup in our clinic in the next 2-3 weeks as well  Dr.Snider here tomorrow.    LOS: 6 days   Acey Lav 10/16/2013, 1:24 PM

## 2013-10-16 NOTE — Progress Notes (Signed)
Patient ID: Tracie Estrada, female   DOB: November 05, 1966, 47 y.o.   MRN: [id number] 5 Days Post-Op  Subjective: Pt "stir crazy."  Denies breast pain.    Objective: Vital signs in last 24 hours: Temp:  [98.3 F (36.8 C)-98.7 F (37.1 C)] 98.7 F (37.1 C) (11/23 0636) Pulse Rate:  [60-68] 68 (11/23 0636) Resp:  [18] 18 (11/23 0636) BP: (121-212)/(80-105) 172/82 mmHg (11/23 0636) SpO2:  [99 %-100 %] 100 % (11/23 0636) Last BM Date: 10/09/13  Intake/Output from previous day: 11/22 0701 - 11/23 0700 In: 2296.7 [P.O.:240; I.V.:1906.7; IV Piggyback:150] Out: 1000 [Urine:1000] Intake/Output this shift:    PE: General- In NAD Left breast-minimal induration.    Minimal discomfort.    Lab Results:   Recent Labs  10/15/13 0445 10/16/13 0458  WBC 14.6* 12.5*  HGB 10.9* 10.6*  HCT 32.8* 31.7*  PLT 386 371   BMET  Recent Labs  10/15/13 0445 10/16/13 0458  NA 133* 136  K 3.1* 3.4*  CL 94* 100  CO2 27 25  GLUCOSE 92 91  BUN 19 16  CREATININE 1.95* 1.63*  CALCIUM 9.0 8.7   PT/INR No results found for this basename: LABPROT, INR,  in the last 72 hours Comprehensive Metabolic Panel:    Component Value Date/Time   NA 136 10/16/2013 0458   K 3.4* 10/16/2013 0458   CL 100 10/16/2013 0458   CO2 25 10/16/2013 0458   BUN 16 10/16/2013 0458   CREATININE 1.63* 10/16/2013 0458   GLUCOSE 91 10/16/2013 0458   CALCIUM 8.7 10/16/2013 0458     Studies/Results: Final culture:  Pan sensitive staph aureus  Anti-infectives: Anti-infectives   Start     Dose/Rate Route Frequency Ordered Stop   10/15/13 1400  ceFAZolin (ANCEF) IVPB 1 g/50 mL premix     1 g 100 mL/hr over 30 Minutes Intravenous 3 times per day 10/15/13 1256     10/13/13 1000  doxycycline (VIBRA-TABS) tablet 100 mg  Status:  Discontinued     100 mg Oral Every 12 hours 10/13/13 0712 10/15/13 0856   10/12/13 2200  vancomycin (VANCOCIN) IVPB 1000 mg/200 mL premix  Status:  Discontinued     1,000 mg 200 mL/hr over 60  Minutes Intravenous Every 12 hours 10/12/13 1114 10/13/13 0711   10/11/13 0200  vancomycin (VANCOCIN) IVPB 1000 mg/200 mL premix  Status:  Discontinued     1,000 mg 200 mL/hr over 60 Minutes Intravenous Every 8 hours 10/10/13 1629 10/12/13 1100   10/10/13 1800  vancomycin (VANCOCIN) 1,500 mg in sodium chloride 0.9 % 500 mL IVPB     1,500 mg 250 mL/hr over 120 Minutes Intravenous  Once 10/10/13 1629 10/10/13 1924   10/10/13 1700  piperacillin-tazobactam (ZOSYN) IVPB 3.375 g  Status:  Discontinued     3.375 g 12.5 mL/hr over 240 Minutes Intravenous Every 8 hours 10/10/13 1556 10/15/13 1053   10/10/13 1600  ceFAZolin (ANCEF) IVPB 2 g/50 mL premix  Status:  Discontinued     2 g 100 mL/hr over 30 Minutes Intravenous Every 8 hours 10/10/13 1525 10/10/13 1556      Assessment Principal Problem:   Complex left breast abscess s/p I & D-WBC slowly rising; no clinical evidence of recurrent infection; has been on appropriate abxs      Switched to ancef.  Breast ultrasound done, but read pending.  Will not be read until tomorrow.    OK to go home when medicine happy with BP control.  Has trended down  significantly.  BP 200 was over 24 hours ago.     LOS: 6 days   Plan: NS wet to dry dressing changes BID.    Cr continues to improve.     [facility] 10/16/2013

## 2013-10-16 NOTE — Progress Notes (Signed)
Pt's IV is out of date.  Told pt reason for changing IV sites after 96 hours. Told her about infection risks and signs that the IV is no longer working.  Pt states she understands and does not want her IV site rotated at this time.  Told pt if she changes her mind at any time to call to have IV changed.

## 2013-10-17 DIAGNOSIS — N61 Mastitis without abscess: Principal | ICD-10-CM

## 2013-10-17 LAB — CBC
HCT: 32.4 % — ABNORMAL LOW (ref 36.0–46.0)
Hemoglobin: 11 g/dL — ABNORMAL LOW (ref 12.0–15.0)
MCH: 27.8 pg (ref 26.0–34.0)
MCHC: 34 g/dL (ref 30.0–36.0)
RBC: 3.95 MIL/uL (ref 3.87–5.11)
RDW: 14.7 % (ref 11.5–15.5)

## 2013-10-17 LAB — BASIC METABOLIC PANEL
BUN: 15 mg/dL (ref 6–23)
CO2: 26 mEq/L (ref 19–32)
Creatinine, Ser: 1.49 mg/dL — ABNORMAL HIGH (ref 0.50–1.10)
GFR calc Af Amer: 47 mL/min — ABNORMAL LOW (ref >90)
Glucose, Bld: 96 mg/dL (ref 70–99)
Potassium: 3.4 mEq/L — ABNORMAL LOW (ref 3.5–5.1)
Sodium: 138 mEq/L (ref 135–145)

## 2013-10-17 MED ORDER — POLYETHYLENE GLYCOL 3350 17 G PO PACK: 17.0000 g | PACK | Freq: Every day | ORAL | Status: DC | PRN

## 2013-10-17 MED ORDER — HYDRALAZINE HCL 50 MG PO TABS: 50.0000 mg | ORAL_TABLET | Freq: Three times a day (TID) | ORAL | Status: DC

## 2013-10-17 MED ORDER — CEPASTAT 14.5 MG MT LOZG
1.0000 | LOZENGE | OROMUCOSAL | Status: DC | PRN
Start: 2013-10-17 — End: 2013-10-17
  Filled 2013-10-17: qty 18

## 2013-10-17 MED ORDER — ACETAMINOPHEN 325 MG PO TABS: 650.0000 mg | ORAL_TABLET | Freq: Four times a day (QID) | ORAL | Status: DC | PRN

## 2013-10-17 MED ORDER — OXYCODONE HCL 5 MG PO TABS: 5.0000 mg | ORAL_TABLET | ORAL | Status: DC | PRN

## 2013-10-17 MED ORDER — CEPASTAT 14.5 MG MT LOZG
1.0000 | LOZENGE | OROMUCOSAL | Status: DC | PRN
  Administered 2013-10-17: 1 via BUCCAL
  Filled 2013-10-17: qty 18

## 2013-10-17 MED ORDER — CEPHALEXIN 500 MG PO CAPS: 500.0000 mg | ORAL_CAPSULE | Freq: Three times a day (TID) | ORAL | Status: DC

## 2013-10-17 MED ORDER — AMLODIPINE BESYLATE 10 MG PO TABS: 10.0000 mg | ORAL_TABLET | Freq: Every day | ORAL | Status: DC

## 2013-10-17 MED ORDER — CEPASTAT 14.5 MG MT LOZG
1.0000 | LOZENGE | OROMUCOSAL | Status: DC | PRN
Start: 2013-10-17 — End: 2013-10-17
  Filled 2013-10-17: qty 9

## 2013-10-17 NOTE — Progress Notes (Signed)
Note patient is scheduled to be discharged home today. ARF continues to improve. Would continue current doses of norvasc 10 mg and hydralazine 50 mg TID for BP management. Will need close PCP follow up to recheck kidney function and for further BP management. Will sign off for now. Please call us back for questions.  Peggye Pitt, MD [facility] Pager: [phone]

## 2013-10-17 NOTE — Discharge Summary (Signed)
General surgery attending:  Agree with discharge summary and care plan and followup as outlined.  Angelia Mould. Derrell Lolling, M.D., [facility] General and Minimally invasive Surgery Breast and Colorectal Surgery Office:   [phone] Pager:   [phone]

## 2013-10-17 NOTE — Discharge Summary (Signed)
Physician Discharge Summary  Patient ID: Tracie Estrada MRN: [id number] DOB/AGE: 49-23-1967 49 y.o.  Admit date: 10/10/2013 Discharge date: 10/17/2013  Admission Diagnoses:  Left breast abscess and severe mastitis  Left breast mass  Mastitis severe and acute       Discharge Diagnoses:  Complex Left breast abscess and severe mastitis  Left breast mass  Leukocytosis with [id number]  ARF with probable Vancomycin toxicity  Malignant hypertension with poor control (No medical treatment prior to admit)  Tobacco use  Lovenox for DVT  Principal Problem:   Breast mass, left Active Problems:   Malignant hypertension   Acute renal failure   SMOKER   Hypokalemia   PROCEDURES:  Incision, drainage, and debridement of left breast abscess, 10/11/2013, Adolph Pollack, MD.     Hospital Course: HPI patient sent at the request of Dr.Jarosk for left breast papilloma. A 4 cm mass noted on recent left breast mammogram with left nipple discharge. This was bloody. 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 Over the weekend she developed acute swelling, redness and now has foul-smelling drainage from the nipple in the lateral left breast. She was seen in the emergency room and given a prescription for antibiotics and sent home. She could not afford the medication. She's had more left breast redness and now significant drainage which is foul smelling from both the nipple and lateral port of the same breast. Denies any fever or chills but she feels poorly. She was seen in our office and referred to [facility] for admission and I&D of abscess by Dr. Abbey Chatters. She was admitted and taken to the OR the following AM.  She was found to have a complex left breast abscess. And had the above noted procedure with drain placement.  She was started on IV Zosyn and Vancomycin on admit, these were continued post op while awaiting the culture results. She started having dressing changes the 2nd post op day.  Her  baseline creatinine on admit was 0.65 on 10/10/13 admit.  On 11/20.20 she had received 3 days of Vancomycin with pharmacy following her for dosing. She was extremely hypertensive after admit.  She was started on labetalol prn, ACE and a Hctz was also added, but she still did not have good control. All of these medicines were held and Medicine was ask to see AM of 10/13/13.  Her Vancomycin was switched to Doxycycline, and she was kept on Zosyn till culture showed  MSSA.  She made fairly good progress from the standpoint of her breast abscess.  She was on BID dressing changes her drain was removed on 10/14/13. Her blood pressure and creatinine are slowly improving.  Her WBC is slowly improving. Breast ultrasound was performed over the weekend because of ongoing WBC elevation and it revealed postsurgical changes without any discrete abscesses found.  We plan to teach her family how to do dressing change, Home Health to help, with bid dressing changes.  She will follow up with Dr. Abbey Chatters 10/26/13, she also has follow up with CBC and BMP on 10/24/13 and Medicine 10/25/13.  Condition on d/c:  Improved  Disposition: 01-Home or Self Care  Discharge Orders   Future Appointments Provider Department Dept Phone   10/25/2013 10:00 AM Jeanann Lewandowsky, MD [facility] [phone]   10/26/2013 11:00 AM Adolph Pollack, MD [facility], [place] [phone]   11/08/2013 2:30 PM [facility] AND CERVICAL CANCER CONTROL PROGRAM [phone]   Future Orders Complete By Expires  Change dressing  As directed    Scheduling Instructions:     You can shower and let dressing come out.  Wash area with soap and water. Wet the 4 x 4 with normal sterile saline.  Open a  4 x 4, and use an applicator stick and pack the tail of the 4 x 4 to the base of the wound.   We just need to wick it.  Then use remaining 4 x 4 to pack the remainder of the wound, Dry dressing over this.        Medication List         acetaminophen 325 MG tablet  Commonly known as:  TYLENOL  Take 2 tablets (650 mg total) by mouth every 6 (six) hours as needed for mild pain (Do not take more than 4000 mg of tylenol per day.  This medicine is in your prescrition medication.).     amLODipine 10 MG tablet  Commonly known as:  NORVASC  Take 1 tablet (10 mg total) by mouth daily.     cephALEXin 500 MG capsule  Commonly known as:  KEFLEX  Take 1 capsule (500 mg total) by mouth 3 (three) times daily.     hydrALAZINE 50 MG tablet  Commonly known as:  APRESOLINE  Take 1 tablet (50 mg total) by mouth every 8 (eight) hours.     ibuprofen 100 MG/5ML suspension  Commonly known as:  ADVIL,MOTRIN  Take 600 mg by mouth every 4 (four) hours as needed.     oxyCODONE 5 MG immediate release tablet  Commonly known as:  Oxy IR/ROXICODONE  Take 1 tablet (5 mg total) by mouth every 4 (four) hours as needed for moderate pain.     oxyCODONE-acetaminophen 5-325 MG per tablet  Commonly known as:  PERCOCET/ROXICET  Take 1 tablet by mouth every 4 (four) hours as needed for moderate pain.     polyethylene glycol packet  Commonly known as:  MIRALAX / GLYCOLAX  Take 17 g by mouth daily as needed (You need 1-2 smooth soft bowel movements per day use this as needed.).           Follow-up Information   Follow up with Jeanann Lewandowsky, MD On 10/25/2013. [facility] at 10am)    Specialty:  Internal Medicine   Contact information:   [address] [place] [address] [phone]       Follow up with [facility]. [facility] RN )    Contact information:   [address] [place] [address] [phone]       Follow up with [facility]. (Please call to arrange appointment to establish a Primary Care Physician. )    Contact information:   [address] [place] [address] [phone]      Follow up with Adolph Pollack, MD On  10/26/2013. (Be at the office at 10:45 to see Dr. Abbey Chatters at 11:00 AM)    Specialty:  General Surgery   Contact information:   [address] [place] [address] [phone]       Please follow up. (ON MONDAY DECEMBER 1, go to lab at the address above and have labs drawn.)    Contact information:   [address] Laboratory      Signed: Sherrie George 10/17/2013, 2:20 PM

## 2013-10-17 NOTE — Progress Notes (Addendum)
General surgery attending note.  I have interviewed and examined this patient this morning. I agree with the assessment and treatment plan outlined by Mr. Tracie Estrada, [place].  Left breast is much softer and much less tender.I do not feel a mass or abscess.Cultures growing MSSA. Now on Keflex.   Assessment/Plan:  Left breast abscess,6 days postop incision and drainage. Clearly improving clinically by physical exam and symptomatically. Ultrasound shows postoperative changes, no drainable fluid collections.  She may be discharged from a surgical standpoint. When discharged, she should receive a prescription for Keflex for 7 days She should followup with Dr. Avel Estrada in approximately 7 days.   Malignant hypertension ARF With probable a vancomycin toxicity HIV and hepatitis panel ordered by medicine Tobacco abuse. This will adversely affect wound healing.   Tracie Estrada. Tracie Estrada, M.D., [facility] General and Minimally invasive Surgery Breast and Colorectal Surgery Office:   [phone] Pager:   [phone]

## 2013-10-17 NOTE — Progress Notes (Signed)
6 Days Post-Op  Subjective: Dressing changed, and it has closed up quite a bit.  i used a applicator stick and It goes to about 4.5 cm deep, but this is mostly closed. It was 7 cm deep last week when I measured it.   It was packed to about 2 cm.  Swelling is much better. Still complaining of sore throat. Objective: Vital signs in last 24 hours: Temp:  [97.9 F (36.6 C)-98 F (36.7 C)] 98 F (36.7 C) (11/24 0615) Pulse Rate:  [61-75] 61 (11/24 0615) Resp:  [18] 18 (11/24 0615) BP: (170-205)/(80-83) 190/82 mmHg (11/24 0617) SpO2:  [99 %-100 %] 99 % (11/24 0615) Last BM Date: 10/09/13 +BM x 2 yesterday BP 190/82 this Am 06003 WBC 11.0 Renal ultrasound; Increased echotexture compatible with chronic medical renal disease. No hydronephrosis. Creatinine is trending back to normal. Ultra sound of the breast is still pending  Intake/Output from previous day: 11/23 0701 - 11/24 0700 In: 1800 [P.O.:600; I.V.:1200] Out: -  Intake/Output this shift:    General appearance: alert and cooperative Breasts: Left breast is about 1.5 time the right now.  There is some palpable induration around the breast, but no cellulitis around the open portion.  It is much less tender than it was last week.   Lab Results:   Recent Labs  10/16/13 0458 10/17/13 0442  WBC 12.5* 11.0*  HGB 10.6* 11.0*  HCT 31.7* 32.4*  PLT 371 361    BMET  Recent Labs  10/16/13 0458 10/17/13 0442  NA 136 138  K 3.4* 3.4*  CL 100 102  CO2 25 26  GLUCOSE 91 96  BUN 16 15  CREATININE 1.63* 1.49*  CALCIUM 8.7 8.8   PT/INR No results found for this basename: LABPROT, INR,  in the last 72 hours  No results found for this basename: AST, ALT, ALKPHOS, BILITOT, PROT, ALBUMIN,  in the last 168 hours   Lipase  No results found for this basename: lipase     Studies/Results: US Renal  10/15/2013   CLINICAL DATA:  Acute renal failure  EXAM: RENAL/URINARY TRACT ULTRASOUND COMPLETE  COMPARISON:  CT 02/18/2013   FINDINGS: Right Kidney  Length: 11.7 cm. Increased echotexture throughout the right kidney. No hydronephrosis or focal abnormality.  Left Kidney  Length: 11.6 cm. Increased echotexture throughout the left kidney. No hydronephrosis or focal abnormality.  Bladder  Appears normal for degree of bladder distention.  IMPRESSION: Increased echotexture compatible with chronic medical renal disease. No hydronephrosis.   Electronically Signed   By: Charlett Nose M.D.   On: 10/15/2013 16:22    Medications: . amLODipine  10 mg Oral Daily  . cephALEXin  500 mg Oral Q6H  . enoxaparin (LOVENOX) injection  40 mg Subcutaneous Q24H  . hydrALAZINE  50 mg Oral Q8H  . polyethylene glycol  17 g Oral Daily     6d ago   Specimen Description ABSCESS LT BREAST    Special Requests PATIENT ON FOLLOWING ZOSYN    Culture FEW STAPHYLOCOCCUS AUREUS Note: RIFAMPIN AND GENTAMICIN SHOULD NOT BE USED AS SINGLE DRUGS FOR TREATMENT OF STAPH INFECTIONS. Performed at [facility]   Report Status 10/14/2013 FINAL    Organism ID, Bacteria STAPHYLOCOCCUS AUREUS      Assessment/Plan Complex left breast abscess s/p I & D-WBC slowly rising; no clinical evidence of recurrent infection; has been on appropriate abxs. (She has had 5 days of Zosyn, 3 days of Vancomycin stopped on 10/13/13.  Cefazolin 11/22, AND STARTED  ON Keflex 10/16/13.) Leukocytosis with [id number] ARF with probable Vancomycin toxicity Malignant hypertension with poor control  (No medical treatment prior to admit) Tobacco use  Lovenox for DVT  Plan:  I have repacked the wound and used an open 4x4.  I opened it and packed the tail to the depth of the wound, it will not take much more than that.  Continue wet to dry dressing.  I will let her shower and she can take dressing out with showers.  Will discuss when to let her go home and dressing changes at home.    LOS: 7 days    Basem Yannuzzi 10/17/2013

## 2013-10-18 ENCOUNTER — Ambulatory Visit (HOSPITAL_COMMUNITY): Payer: Self-pay

## 2013-10-25 ENCOUNTER — Ambulatory Visit: Payer: Self-pay | Attending: Internal Medicine | Admitting: Internal Medicine

## 2013-10-25 ENCOUNTER — Encounter: Payer: Self-pay | Admitting: Internal Medicine

## 2013-10-25 VITALS — BP 176/112 | HR 81 | Temp 97.7°F | Resp 16 | Ht 59.0 in | Wt 174.0 lb

## 2013-10-25 DIAGNOSIS — N189 Chronic kidney disease, unspecified: Secondary | ICD-10-CM | POA: Insufficient documentation

## 2013-10-25 DIAGNOSIS — I10 Essential (primary) hypertension: Secondary | ICD-10-CM | POA: Insufficient documentation

## 2013-10-25 DIAGNOSIS — N61 Mastitis without abscess: Secondary | ICD-10-CM

## 2013-10-25 DIAGNOSIS — N611 Abscess of the breast and nipple: Secondary | ICD-10-CM

## 2013-10-25 LAB — CMP AND LIVER
Albumin: 3.5 g/dL (ref 3.5–5.2)
BUN: 9 mg/dL (ref 6–23)
Bilirubin, Direct: 0.1 mg/dL (ref 0.0–0.3)
CO2: 30 mEq/L (ref 19–32)
Calcium: 9.1 mg/dL (ref 8.4–10.5)
Chloride: 101 mEq/L (ref 96–112)
Creat: 1.14 mg/dL — ABNORMAL HIGH (ref 0.50–1.10)
Glucose, Bld: 83 mg/dL (ref 70–99)
Indirect Bilirubin: 0.1 mg/dL (ref 0.0–0.9)
Potassium: 3.3 mEq/L — ABNORMAL LOW (ref 3.5–5.3)

## 2013-10-25 LAB — LIPID PANEL
Cholesterol: 185 mg/dL (ref 0–200)
LDL Cholesterol: 118 mg/dL — ABNORMAL HIGH (ref 0–99)
Total CHOL/HDL Ratio: 5.1 Ratio
Triglycerides: 153 mg/dL — ABNORMAL HIGH (ref <150)
VLDL: 31 mg/dL (ref 0–40)

## 2013-10-25 MED ORDER — HYDROCHLOROTHIAZIDE 25 MG PO TABS: 25.0000 mg | ORAL_TABLET | Freq: Every day | ORAL | Status: DC

## 2013-10-25 NOTE — Progress Notes (Signed)
HFU Pt is here to manage her HTN Pt is following up on her abscess on her left breast.

## 2013-10-25 NOTE — Patient Instructions (Signed)
DASH Diet  The DASH diet stands for "Dietary Approaches to Stop Hypertension." It is a healthy eating plan that has been shown to reduce high blood pressure (hypertension) in as little as 14 days, while also possibly providing other significant health benefits. These other health benefits include reducing the risk of breast cancer after menopause and reducing the risk of type 2 diabetes, heart disease, colon cancer, and stroke. Health benefits also include weight loss and slowing kidney failure in patients with chronic kidney disease.   DIET GUIDELINES  · Limit salt (sodium). Your diet should contain less than 1500 mg of sodium daily.  · Limit refined or processed carbohydrates. Your diet should include mostly whole grains. Desserts and added sugars should be used sparingly.  · Include small amounts of heart-healthy fats. These types of fats include nuts, oils, and tub margarine. Limit saturated and trans fats. These fats have been shown to be harmful in the body.  CHOOSING FOODS   The following food groups are based on a 2000 calorie diet. See your Registered Dietitian for individual calorie needs.  Grains and Grain Products (6 to 8 servings daily)  · Eat More Often: Whole-wheat bread, brown rice, whole-grain or wheat pasta, quinoa, popcorn without added fat or salt (air popped).  · Eat Less Often: White bread, white pasta, white rice, cornbread.  Vegetables (4 to 5 servings daily)  · Eat More Often: Fresh, frozen, and canned vegetables. Vegetables may be raw, steamed, roasted, or grilled with a minimal amount of fat.  · Eat Less Often/Avoid: Creamed or fried vegetables. Vegetables in a cheese sauce.  Fruit (4 to 5 servings daily)  · Eat More Often: All fresh, canned (in natural juice), or frozen fruits. Dried fruits without added sugar. One hundred percent fruit juice (½ cup [237 mL] daily).  · Eat Less Often: Dried fruits with added sugar. Canned fruit in light or heavy syrup.  Lean Meats, Fish, and Poultry (2  servings or less daily. One serving is 3 to 4 oz [85-114 g]).  · Eat More Often: Ninety percent or leaner ground beef, tenderloin, sirloin. Round cuts of beef, chicken breast, turkey breast. All fish. Grill, bake, or broil your meat. Nothing should be fried.  · Eat Less Often/Avoid: Fatty cuts of meat, turkey, or chicken leg, thigh, or wing. Fried cuts of meat or fish.  Dairy (2 to 3 servings)  · Eat More Often: Low-fat or fat-free milk, low-fat plain or light yogurt, reduced-fat or part-skim cheese.  · Eat Less Often/Avoid: Milk (whole, 2%). Whole milk yogurt. Full-fat cheeses.  Nuts, Seeds, and Legumes (4 to 5 servings per week)  · Eat More Often: All without added salt.  · Eat Less Often/Avoid: Salted nuts and seeds, canned beans with added salt.  Fats and Sweets (limited)  · Eat More Often: Vegetable oils, tub margarines without trans fats, sugar-free gelatin. Mayonnaise and salad dressings.  · Eat Less Often/Avoid: Coconut oils, palm oils, butter, stick margarine, cream, half and half, cookies, candy, pie.  FOR MORE INFORMATION  The Dash Diet Eating Plan: www.dashdiet.org  Document Released: 10/30/2011 Document Revised: 02/02/2012 Document Reviewed: 10/30/2011  ExitCare® Patient Information ©2014 [employer].

## 2013-10-25 NOTE — Progress Notes (Signed)
Patient ID: Tracie Estrada, female   DOB: July 11, 1966, 47 y.o.   MRN: [id number] Patient Demographics  Tracie Estrada, is a 47 y.o. female  [id number]  [id number]  DOB - 03-28-1966  CC:  Chief Complaint  Patient presents with  . Hospitalization Follow-up       HPI: Tracie Estrada is a 47 y.o. female here today to establish medical care. She was recently seen in the ER and admitted for breast abscess status post incision and drainage, she is known to have hypertension for a long time but has not been compliant with medications, with was in the hospital, she was placed on IV vancomycin which caused acute renal failure, last creatinine was 1.49. Patient has no symptom. She is here for followup of hospital discharge. She smoke cigarette about one pack per day for the past 26 years, when counseled about quitting she said, "oh I have to have my cigarette". She is not known to have diabetes. After discharge from the hospital she was placed on amlodipine and hydralazine, she has not taken any medication today, she still on Keflex for breast abscess and pain medication. Patient has No headache, No chest pain, No abdominal pain - No Nausea, No new weakness tingling or numbness, No Cough - SOB.  Allergies  Allergen Reactions  . Fish Allergy Anaphylaxis  . Peanuts [Peanut Oil] Anaphylaxis  . Other Other (See Comments)    Anesthesia.  Specific agent unknown.  Reaction unknown.   . Vancomycin Other (See Comments)    Nephrotoxicity   Past Medical History  Diagnosis Date  . Hypertension   . Malignant hypertension 10/13/2013  . Acute renal failure 10/13/2013    On Vancomycin  . Breast abscess of female     Status post biopsy   Current Outpatient Prescriptions on File Prior to Visit  Medication Sig Dispense Refill  . amLODipine (NORVASC) 10 MG tablet Take 1 tablet (10 mg total) by mouth daily.  30 tablet  0  . cephALEXin (KEFLEX) 500 MG capsule Take 1 capsule (500 mg total) by mouth 3 (three) times  daily.  40 capsule  0  . hydrALAZINE (APRESOLINE) 50 MG tablet Take 1 tablet (50 mg total) by mouth every 8 (eight) hours.  90 tablet  0  . acetaminophen (TYLENOL) 325 MG tablet Take 2 tablets (650 mg total) by mouth every 6 (six) hours as needed for mild pain (Do not take more than 4000 mg of tylenol per day.  This medicine is in your prescrition medication.).      Marland Kitchen ibuprofen (ADVIL,MOTRIN) 100 MG/5ML suspension Take 600 mg by mouth every 4 (four) hours as needed.      Marland Kitchen oxyCODONE (OXY IR/ROXICODONE) 5 MG immediate release tablet Take 1 tablet (5 mg total) by mouth every 4 (four) hours as needed for moderate pain.  50 tablet  0  . oxyCODONE-acetaminophen (PERCOCET/ROXICET) 5-325 MG per tablet Take 1 tablet by mouth every 4 (four) hours as needed for moderate pain.  30 tablet  0  . polyethylene glycol (MIRALAX / GLYCOLAX) packet Take 17 g by mouth daily as needed (You need 1-2 smooth soft bowel movements per day use this as needed.).  14 each  0   No current facility-administered medications on file prior to visit.   Family History  Problem Relation Age of Onset  . Breast cancer Mother   . Cancer Mother     brain  . Hypertension Father   . Thyroid disease Daughter    History  Social History  . Marital Status: Single    Spouse Name: N/A    Number of Children: N/A  . Years of Education: N/A   Occupational History  . Not on file.   Social History Main Topics  . Smoking status: Current Every Day Smoker -- 1.00 packs/day for 26 years    Types: Cigarettes  . Smokeless tobacco: Never Used  . Alcohol Use: No  . Drug Use: No  . Sexual Activity: No   Other Topics Concern  . Not on file   Social History Narrative   Single.  Lives with daugher and grandchildren.    Review of Systems: Constitutional: Negative for fever, chills, diaphoresis, activity change, appetite change and fatigue. HENT: Negative for ear pain, nosebleeds, congestion, facial swelling, rhinorrhea, neck pain, neck  stiffness and ear discharge.  Eyes: Negative for pain, discharge, redness, itching and visual disturbance. Respiratory: Negative for cough, choking, chest tightness, shortness of breath, wheezing and stridor.  Cardiovascular: Negative for chest pain, palpitations and leg swelling. Gastrointestinal: Negative for abdominal distention. Genitourinary: Negative for dysuria, urgency, frequency, hematuria, flank pain, decreased urine volume, difficulty urinating and dyspareunia.  Musculoskeletal: Negative for back pain, joint swelling, arthralgia and gait problem. Neurological: Negative for dizziness, tremors, seizures, syncope, facial asymmetry, speech difficulty, weakness, light-headedness, numbness and headaches.  Hematological: Negative for adenopathy. Does not bruise/bleed easily. Psychiatric/Behavioral: Negative for hallucinations, behavioral problems, confusion, dysphoric mood, decreased concentration and agitation.    Objective:   Filed Vitals:   10/25/13 1015  BP: 218/125  Pulse: 81  Temp: 97.7 F (36.5 C)  Resp: 16    Physical Exam: Constitutional: Patient appears well-developed and well-nourished. No distress. HENT: Normocephalic, atraumatic, External right and left ear normal. Oropharynx is clear and moist.  Eyes: Conjunctivae and EOM are normal. PERRLA, no scleral icterus. Neck: Normal ROM. Neck supple. No JVD. No tracheal deviation. No thyromegaly. CVS: RRR, S1/S2 +, no murmurs, no gallops, no carotid bruit.  Pulmonary: Effort and breath sounds normal, no stridor, rhonchi, wheezes, rales.  Abdominal: Soft. BS +, no distension, tenderness, rebound or guarding.  Musculoskeletal: Normal range of motion. No edema and no tenderness.  Lymphadenopathy: No lymphadenopathy noted, cervical, inguinal or axillary Neuro: Alert. Normal reflexes, muscle tone coordination. No cranial nerve deficit. Skin: Skin is warm and dry. No rash noted. Not diaphoretic. No erythema. No  pallor. Psychiatric: Normal mood and affect. Behavior, judgment, thought content normal.  Lab Results  Component Value Date   WBC 11.0* 10/17/2013   HGB 11.0* 10/17/2013   HCT 32.4* 10/17/2013   MCV 82.0 10/17/2013   PLT 361 10/17/2013   Lab Results  Component Value Date   CREATININE 1.49* 10/17/2013   BUN 15 10/17/2013   NA 138 10/17/2013   K 3.4* 10/17/2013   CL 102 10/17/2013   CO2 26 10/17/2013    Lab Results  Component Value Date   HGBA1C  Value: 5.8 (NOTE)                                                                       According to the ADA Clinical Practice Recommendations for 2011, when HbA1c is used as a screening test:   >=6.5%   Diagnostic of Diabetes Mellitus           (  if abnormal result  is confirmed)  5.7-6.4%   Increased risk of developing Diabetes Mellitus  References:Diagnosis and Classification of Diabetes Mellitus,Diabetes Care,2011,34(Suppl 1):S62-S69 and Standards of Medical Care in         Diabetes - 2011,Diabetes Care,2011,34  (Suppl 1):S11-S61.* 07/07/2010   Lipid Panel     Component Value Date/Time   CHOL  Value: 171        ATP III CLASSIFICATION:  <200     mg/dL   Desirable  884-166  mg/dL   Borderline High  >=063    mg/dL   High        0/16/0109 0539   TRIG 164* 07/08/2010 0539   HDL 37* 07/08/2010 0539   CHOLHDL 4.6 07/08/2010 0539   VLDL 33 07/08/2010 0539   LDLCALC  Value: 101        Total Cholesterol/HDL:CHD Risk Coronary Heart Disease Risk Table                     Men   Women  1/2 Average Risk   3.4   3.3  Average Risk       5.0   4.4  2 X Average Risk   9.6   7.1  3 X Average Risk  23.4   11.0        Use the calculated Patient Ratio above and the CHD Risk Table to determine the patient's CHD Risk.        ATP III CLASSIFICATION (LDL):  <100     mg/dL   Optimal  323-557  mg/dL   Near or Above                    Optimal  130-159  mg/dL   Borderline  322-025  mg/dL   High  >427     mg/dL   Very High* 0/62/3762 0539       Assessment and plan:   1.  Accelerated hypertension Give clonidine tablet 0.2 mg now dose, repeat blood pressure in 30 minutes  Add hydrochlorothiazide 25 mg tablet by mouth daily Continue hydralazine 50 mg tablet by mouth 3 times a day Continue Norvasc 10 mg tablet daily DASH diet  Return in one week for blood pressure check  Labs - CMP and Liver - TSH - T4, free - Urinalysis, Complete - CBC with Differential - Lipid panel  2. Acute on Chronic kidney disease Repeat labs today  3. Breast abscess of female Continue Keflex 500 mg tablet by mouth twice a day  Repeat blood pressure was 174/90 mmHg  Patient has been counseled extensively about smoking cessation Patient counseled extensively about nutrition and exercise  Follow up in one week   The patient was given clear instructions to go to ER or return to medical center if symptoms don't improve, worsen or new problems develop. The patient verbalized understanding. The patient was told to call to get lab results if they haven't heard anything in the next week.     Jeanann Lewandowsky, MD, MHA, [facility] [place], [place] [phone]   10/25/2013, 10:49 AM

## 2013-10-26 ENCOUNTER — Encounter (INDEPENDENT_AMBULATORY_CARE_PROVIDER_SITE_OTHER): Payer: Self-pay | Admitting: General Surgery

## 2013-10-26 ENCOUNTER — Ambulatory Visit (INDEPENDENT_AMBULATORY_CARE_PROVIDER_SITE_OTHER): Payer: Self-pay | Admitting: General Surgery

## 2013-10-26 VITALS — BP 168/107 | HR 66 | Temp 97.7°F | Resp 18 | Ht 59.0 in | Wt 175.6 lb

## 2013-10-26 DIAGNOSIS — N61 Mastitis without abscess: Secondary | ICD-10-CM

## 2013-10-26 DIAGNOSIS — N611 Abscess of the breast and nipple: Secondary | ICD-10-CM

## 2013-10-26 LAB — CBC WITH DIFFERENTIAL/PLATELET
Basophils Absolute: 0.1 10*3/uL (ref 0.0–0.1)
Basophils Relative: 1 % (ref 0–1)
Hemoglobin: 11.3 g/dL — ABNORMAL LOW (ref 12.0–15.0)
Lymphocytes Relative: 22 % (ref 12–46)
MCHC: 33.2 g/dL (ref 30.0–36.0)
Monocytes Relative: 5 % (ref 3–12)
Neutro Abs: 6.1 10*3/uL (ref 1.7–7.7)
Neutrophils Relative %: 67 % (ref 43–77)
RDW: 15.9 % — ABNORMAL HIGH (ref 11.5–15.5)
WBC: 9 10*3/uL (ref 4.0–10.5)

## 2013-10-26 LAB — URINALYSIS, COMPLETE
Bilirubin Urine: NEGATIVE
Crystals: NONE SEEN
Glucose, UA: NEGATIVE mg/dL
Ketones, ur: NEGATIVE mg/dL
Protein, ur: NEGATIVE mg/dL
Specific Gravity, Urine: 1.008 (ref 1.005–1.030)
Urobilinogen, UA: 0.2 mg/dL (ref 0.0–1.0)

## 2013-10-26 LAB — TSH: TSH: 0.81 u[IU]/mL (ref 0.350–4.500)

## 2013-10-26 LAB — T4, FREE: Free T4: 1.19 ng/dL (ref 0.80–1.80)

## 2013-10-26 NOTE — Progress Notes (Signed)
Procedure:  Complex incision and drainage of left breast abscess  Date:  10/11/2013  Pathology:  Consistent with abscess  History:  She is here for her first post hospitalization visit. She continues on oral antibiotics. She is changing her wet to dry dressings twice a day.  Exam: General- Is in NAD. Left breast-wound is clean and is healing in very well with good granulation tissue. No surrounding cellulitis or induration.  Assessment:  Left breast abscess status post complex incision and drainage-wound is healing well by secondary intention.  Plan:  Continue current wound care. Return visit one month.

## 2013-10-26 NOTE — Patient Instructions (Signed)
Continue current dressing changes until the wound completely heals.

## 2013-10-27 ENCOUNTER — Telehealth: Payer: Self-pay

## 2013-10-27 ENCOUNTER — Other Ambulatory Visit: Payer: Self-pay | Admitting: Internal Medicine

## 2013-10-27 MED ORDER — POTASSIUM CHLORIDE ER 10 MEQ PO TBCR: 10.0000 meq | EXTENDED_RELEASE_TABLET | Freq: Every day | ORAL | Status: DC

## 2013-10-27 MED ORDER — CIPROFLOXACIN HCL 500 MG PO TABS: 500.0000 mg | ORAL_TABLET | Freq: Two times a day (BID) | ORAL | Status: DC

## 2013-10-27 NOTE — Telephone Encounter (Signed)
Message copied by Lestine Mount on Thu Oct 27, 2013  2:26 PM ------      Message from: Jeanann Lewandowsky E      Created: Wed Oct 26, 2013  4:39 PM       Please inform patient that her kidney function has improved but was tender to monitor closely, her thyroid function is normal, her blood (hemoglobin) level is acceptable, her potassium level is slightly low, her cholesterol level is high and she has some evidence of urinary tract infection.      We recommend: Please call in the prescription      Potassium tablet 600 mg tablet by mouth daily      Ciprofloxacin 500 mg tablet by mouth twice a day for 3 days      Low-cholesterol, low-fat diet      We will repeat these tests in 3 months             ------

## 2013-10-27 NOTE — Telephone Encounter (Signed)
Patient is aware of her labs Script sent to our pharmacy for potassium and cipro

## 2013-11-01 ENCOUNTER — Ambulatory Visit: Payer: Self-pay

## 2013-11-04 ENCOUNTER — Telehealth: Payer: Self-pay | Admitting: Internal Medicine

## 2013-11-04 NOTE — Telephone Encounter (Signed)
Pt called in today with a representative from [facility]; Rep said that Pt had a BP reading of 120/110; pt did not want to go to the ER; pt is scheduled to have her BP check 9:00am Monday Morning;

## 2013-11-04 NOTE — Telephone Encounter (Signed)
Ramiro Harvest from [facility]VM with update on pt's bp readings.  On Tuesday, 11/01/13 pt's bp was 120/110, today 11/04/13 it is 170/110 with heart rate of 112.  Pt is asymptomatic and has been taking new meds that were last prescribed to pt. Pt is scheduled for bp check on 11/07/13.  Please f/u with home care nurse with any suggestions or instructions for med change.

## 2013-11-07 ENCOUNTER — Ambulatory Visit: Payer: Self-pay | Attending: Internal Medicine

## 2013-11-07 NOTE — Patient Instructions (Signed)
Pt informed to take 3 prescribed bp meds as ordered daily and monitor at home Pt also informed to pick script ATB at pharmacy [facility] Will update [facility] nurse with readings

## 2013-11-07 NOTE — Progress Notes (Signed)
Pt here for blood pressure recheck s/p elevated HTN States [facility] nurse took reading 12/12 170/110 112 symptomatic Pt admits to not taking meds as ordered. BP today 156/96 105

## 2013-11-08 ENCOUNTER — Ambulatory Visit (HOSPITAL_COMMUNITY): Payer: Self-pay

## 2013-11-09 ENCOUNTER — Telehealth (INDEPENDENT_AMBULATORY_CARE_PROVIDER_SITE_OTHER): Payer: Self-pay

## 2013-11-09 NOTE — Telephone Encounter (Signed)
Chris with [facility] called requesting authorization for 3-4 more visit for wound care. She states wound is doing well but still requires monitoring. I advised her I will send request to Dr Abbey Chatters to review. I will send msg and phone # to [facility] to call nurse back once she has order. Thayer Ohm can be reached at [phone].

## 2013-11-10 NOTE — Telephone Encounter (Signed)
Chris at [facility] given order to add 4 more HHN visits per Dr Abbey Chatters.

## 2013-11-10 NOTE — Telephone Encounter (Signed)
You may approve this.

## 2013-11-18 ENCOUNTER — Other Ambulatory Visit: Payer: Self-pay | Admitting: Internal Medicine

## 2013-11-18 MED ORDER — AMLODIPINE BESYLATE 10 MG PO TABS: 10.0000 mg | ORAL_TABLET | Freq: Every day | ORAL | Status: DC

## 2013-12-01 ENCOUNTER — Encounter (INDEPENDENT_AMBULATORY_CARE_PROVIDER_SITE_OTHER): Payer: Self-pay | Admitting: General Surgery

## 2013-12-05 ENCOUNTER — Other Ambulatory Visit: Payer: Self-pay | Admitting: Emergency Medicine

## 2013-12-05 DIAGNOSIS — N189 Chronic kidney disease, unspecified: Secondary | ICD-10-CM

## 2013-12-05 DIAGNOSIS — I10 Essential (primary) hypertension: Secondary | ICD-10-CM

## 2013-12-05 DIAGNOSIS — N611 Abscess of the breast and nipple: Secondary | ICD-10-CM

## 2013-12-05 MED ORDER — HYDROCHLOROTHIAZIDE 25 MG PO TABS: 25.0000 mg | ORAL_TABLET | Freq: Every day | ORAL | Status: DC

## 2013-12-06 ENCOUNTER — Ambulatory Visit (HOSPITAL_COMMUNITY): Payer: Self-pay

## 2013-12-07 ENCOUNTER — Telehealth: Payer: Self-pay | Admitting: General Practice

## 2013-12-13 ENCOUNTER — Telehealth (INDEPENDENT_AMBULATORY_CARE_PROVIDER_SITE_OTHER): Payer: Self-pay

## 2013-12-13 ENCOUNTER — Telehealth: Payer: Self-pay | Admitting: Internal Medicine

## 2013-12-13 NOTE — Telephone Encounter (Signed)
Noted  

## 2013-12-13 NOTE — Telephone Encounter (Signed)
Vania Rea with [facility] called to let Dr Zella Richer know that pt no longer requires [facility] and has been D/C from care. She can be reached at [phone] with any concerns.

## 2013-12-13 NOTE — Telephone Encounter (Signed)
Nurse called in from [facility] to let Dr. Doreene Burke know that pt has reached the end of her cert period, has cancelled her last few appts and will be discharged today; pt was last seen in our clinic on 11/07/2013

## 2014-01-05 ENCOUNTER — Ambulatory Visit (INDEPENDENT_AMBULATORY_CARE_PROVIDER_SITE_OTHER): Payer: Self-pay | Admitting: General Surgery

## 2014-01-05 ENCOUNTER — Encounter (INDEPENDENT_AMBULATORY_CARE_PROVIDER_SITE_OTHER): Payer: Self-pay | Admitting: General Surgery

## 2014-01-05 VITALS — BP 210/120 | HR 88 | Resp 16 | Ht 59.0 in | Wt 178.0 lb

## 2014-01-05 DIAGNOSIS — Z4889 Encounter for other specified surgical aftercare: Secondary | ICD-10-CM

## 2014-01-05 NOTE — Patient Instructions (Signed)
Please take your blood pressure medicine every day.  You need to get a primary care physician who can help you with your blood pressure.

## 2014-01-05 NOTE — Progress Notes (Signed)
Procedure:  Complex incision and drainage of left breast abscess  Date:  10/11/2013  Pathology:  Consistent with abscess  History:  She is here for her second post hospitalization visit. The wound is completely healed.  Exam: General- Is in NAD.  Her BP is 210/120 Left breast-The wound has healed  Assessment:  Left breast abscess status post complex incision and drainage-wound is healed.  HTN-This is poorly controlled. We had this discussion with her in the hospital. She did not take her blood pressure medication  this morning. She has not gotten herself her primary care physician.  Plan:  I discussed with her the importance of taking her blood pressure medications. I also discussed the importance of getting a primary care physician. We will try and see if we can refer her to the internal medicine clinic at [facility].

## 2014-01-09 ENCOUNTER — Other Ambulatory Visit: Payer: Self-pay | Admitting: Internal Medicine

## 2014-02-01 ENCOUNTER — Telehealth: Payer: Self-pay | Admitting: Internal Medicine

## 2014-02-01 NOTE — Telephone Encounter (Signed)
[facility] calling in regards to an outstanding interim order skilled nursing with start date 12/15/13

## 2014-09-25 ENCOUNTER — Encounter (INDEPENDENT_AMBULATORY_CARE_PROVIDER_SITE_OTHER): Payer: Self-pay | Admitting: General Surgery

## 2015-05-18 ENCOUNTER — Emergency Department (HOSPITAL_COMMUNITY)
Admission: EM | Admit: 2015-05-18 | Discharge: 2015-05-18 | Disposition: A | Discharge status: Home / Self Care | Payer: Self-pay | Attending: Emergency Medicine | Admitting: Emergency Medicine

## 2015-05-18 ENCOUNTER — Encounter (HOSPITAL_COMMUNITY): Payer: Self-pay | Admitting: Emergency Medicine

## 2015-05-18 DIAGNOSIS — H109 Unspecified conjunctivitis: Secondary | ICD-10-CM | POA: Insufficient documentation

## 2015-05-18 DIAGNOSIS — B349 Viral infection, unspecified: Secondary | ICD-10-CM | POA: Insufficient documentation

## 2015-05-18 DIAGNOSIS — Z87448 Personal history of other diseases of urinary system: Secondary | ICD-10-CM | POA: Insufficient documentation

## 2015-05-18 DIAGNOSIS — Z79899 Other long term (current) drug therapy: Secondary | ICD-10-CM | POA: Insufficient documentation

## 2015-05-18 DIAGNOSIS — Z720 Tobacco use: Secondary | ICD-10-CM | POA: Insufficient documentation

## 2015-05-18 DIAGNOSIS — I10 Essential (primary) hypertension: Secondary | ICD-10-CM | POA: Insufficient documentation

## 2015-05-18 LAB — COMPREHENSIVE METABOLIC PANEL
ALK PHOS: 71 U/L (ref 38–126)
ALT: 11 U/L — AB (ref 14–54)
AST: 19 U/L (ref 15–41)
Albumin: 3.9 g/dL (ref 3.5–5.0)
Anion gap: 9 (ref 5–15)
BUN: 9 mg/dL (ref 6–20)
CO2: 25 mmol/L (ref 22–32)
CREATININE: 0.78 mg/dL (ref 0.44–1.00)
Calcium: 8.7 mg/dL — ABNORMAL LOW (ref 8.9–10.3)
Chloride: 100 mmol/L — ABNORMAL LOW (ref 101–111)
GFR calc non Af Amer: >60 mL/min (ref >60)
GLUCOSE: 92 mg/dL (ref 65–99)
Potassium: 3.7 mmol/L (ref 3.5–5.1)
Sodium: 134 mmol/L — ABNORMAL LOW (ref 135–145)
TOTAL PROTEIN: 7.7 g/dL (ref 6.5–8.1)
Total Bilirubin: 0.6 mg/dL (ref 0.3–1.2)

## 2015-05-18 LAB — CBC WITH DIFFERENTIAL/PLATELET
Basophils Absolute: 0 10*3/uL (ref 0.0–0.1)
Basophils Relative: 0 % (ref 0–1)
Eosinophils Absolute: 0 10*3/uL (ref 0.0–0.7)
Eosinophils Relative: 0 % (ref 0–5)
HCT: 40.2 % (ref 36.0–46.0)
Hemoglobin: 13.6 g/dL (ref 12.0–15.0)
LYMPHS ABS: 1.1 10*3/uL (ref 0.7–4.0)
LYMPHS PCT: 7 % — AB (ref 12–46)
MCH: 27.9 pg (ref 26.0–34.0)
MCHC: 33.8 g/dL (ref 30.0–36.0)
MCV: 82.4 fL (ref 78.0–100.0)
Monocytes Absolute: 2.1 10*3/uL — ABNORMAL HIGH (ref 0.1–1.0)
Monocytes Relative: 13 % — ABNORMAL HIGH (ref 3–12)
NEUTROS ABS: 12.6 10*3/uL — AB (ref 1.7–7.7)
NEUTROS PCT: 80 % — AB (ref 43–77)
PLATELETS: 273 10*3/uL (ref 150–400)
RBC: 4.88 MIL/uL (ref 3.87–5.11)
RDW: 15.4 % (ref 11.5–15.5)
WBC: 15.7 10*3/uL — AB (ref 4.0–10.5)

## 2015-05-18 LAB — RAPID STREP SCREEN (MED CTR MEBANE ONLY): Streptococcus, Group A Screen (Direct): NEGATIVE

## 2015-05-18 MED ORDER — METHYLPREDNISOLONE SODIUM SUCC 125 MG IJ SOLR
125.0000 mg | Freq: Once | INTRAMUSCULAR | Status: AC
Start: 2015-05-18 — End: 2015-05-18
  Administered 2015-05-18: 125 mg via INTRAVENOUS
  Filled 2015-05-18: qty 2

## 2015-05-18 MED ORDER — CLONIDINE HCL 0.1 MG PO TABS
0.2000 mg | ORAL_TABLET | Freq: Once | ORAL | Status: AC
  Administered 2015-05-18: 0.2 mg via ORAL
  Filled 2015-05-18: qty 2

## 2015-05-18 MED ORDER — TOBRAMYCIN 0.3 % OP SOLN
2.0000 [drp] | Freq: Four times a day (QID) | OPHTHALMIC | Status: DC
  Administered 2015-05-18: 2 [drp] via OPHTHALMIC
  Filled 2015-05-18: qty 5

## 2015-05-18 MED ORDER — SODIUM CHLORIDE 0.9 % IV BOLUS (SEPSIS)
1000.0000 mL | Freq: Once | INTRAVENOUS | Status: AC
  Administered 2015-05-18: 1000 mL via INTRAVENOUS

## 2015-05-18 MED ORDER — CLONIDINE HCL 0.1 MG PO TABS
0.1000 mg | ORAL_TABLET | Freq: Once | ORAL | Status: AC
  Administered 2015-05-18: 0.1 mg via ORAL
  Filled 2015-05-18: qty 1

## 2015-05-18 MED ORDER — KETOROLAC TROMETHAMINE 30 MG/ML IJ SOLN
30.0000 mg | Freq: Once | INTRAMUSCULAR | Status: AC
  Administered 2015-05-18: 30 mg via INTRAVENOUS
  Filled 2015-05-18: qty 1

## 2015-05-18 NOTE — ED Notes (Signed)
Nurse currently drawing labs 

## 2015-05-18 NOTE — ED Notes (Signed)
Pt c/o eye redness, nasal congestion, body aches, sore throat, and cough since Wednesday.

## 2015-05-20 ENCOUNTER — Emergency Department (HOSPITAL_COMMUNITY): Payer: Self-pay

## 2015-05-20 ENCOUNTER — Encounter (HOSPITAL_COMMUNITY): Payer: Self-pay | Admitting: Emergency Medicine

## 2015-05-20 ENCOUNTER — Emergency Department (HOSPITAL_COMMUNITY)
Admission: EM | Admit: 2015-05-20 | Discharge: 2015-05-20 | Disposition: A | Discharge status: Home / Self Care | Payer: Self-pay | Attending: Emergency Medicine | Admitting: Emergency Medicine

## 2015-05-20 DIAGNOSIS — J069 Acute upper respiratory infection, unspecified: Secondary | ICD-10-CM | POA: Insufficient documentation

## 2015-05-20 DIAGNOSIS — N179 Acute kidney failure, unspecified: Secondary | ICD-10-CM | POA: Insufficient documentation

## 2015-05-20 DIAGNOSIS — Z8742 Personal history of other diseases of the female genital tract: Secondary | ICD-10-CM | POA: Insufficient documentation

## 2015-05-20 DIAGNOSIS — Z3202 Encounter for pregnancy test, result negative: Secondary | ICD-10-CM | POA: Insufficient documentation

## 2015-05-20 DIAGNOSIS — B9789 Other viral agents as the cause of diseases classified elsewhere: Secondary | ICD-10-CM

## 2015-05-20 DIAGNOSIS — Z720 Tobacco use: Secondary | ICD-10-CM | POA: Insufficient documentation

## 2015-05-20 DIAGNOSIS — N189 Chronic kidney disease, unspecified: Secondary | ICD-10-CM

## 2015-05-20 DIAGNOSIS — N611 Abscess of the breast and nipple: Secondary | ICD-10-CM

## 2015-05-20 DIAGNOSIS — I10 Essential (primary) hypertension: Secondary | ICD-10-CM | POA: Insufficient documentation

## 2015-05-20 LAB — BASIC METABOLIC PANEL
ANION GAP: 8 (ref 5–15)
BUN: 19 mg/dL (ref 6–20)
CO2: 28 mmol/L (ref 22–32)
Calcium: 8.4 mg/dL — ABNORMAL LOW (ref 8.9–10.3)
Chloride: 100 mmol/L — ABNORMAL LOW (ref 101–111)
Creatinine, Ser: 0.9 mg/dL (ref 0.44–1.00)
GLUCOSE: 96 mg/dL (ref 65–99)
Potassium: 3.2 mmol/L — ABNORMAL LOW (ref 3.5–5.1)
Sodium: 136 mmol/L (ref 135–145)

## 2015-05-20 LAB — POC URINE PREG, ED: PREG TEST UR: NEGATIVE

## 2015-05-20 LAB — CBC
HEMATOCRIT: 37.4 % (ref 36.0–46.0)
HEMOGLOBIN: 12.7 g/dL (ref 12.0–15.0)
MCH: 27.7 pg (ref 26.0–34.0)
MCHC: 34 g/dL (ref 30.0–36.0)
MCV: 81.7 fL (ref 78.0–100.0)
PLATELETS: 286 10*3/uL (ref 150–400)
RBC: 4.58 MIL/uL (ref 3.87–5.11)
RDW: 15.6 % — ABNORMAL HIGH (ref 11.5–15.5)
WBC: 14.9 10*3/uL — ABNORMAL HIGH (ref 4.0–10.5)

## 2015-05-20 LAB — I-STAT TROPONIN, ED: Troponin i, poc: 0.02 ng/mL (ref 0.00–0.08)

## 2015-05-20 MED ORDER — GUAIFENESIN ER 1200 MG PO TB12: 1.0000 | ORAL_TABLET | Freq: Two times a day (BID) | ORAL | Status: DC

## 2015-05-20 MED ORDER — ACETAMINOPHEN 500 MG PO TABS
1000.0000 mg | ORAL_TABLET | Freq: Once | ORAL | Status: AC
  Administered 2015-05-20: 1000 mg via ORAL
  Filled 2015-05-20: qty 2

## 2015-05-20 MED ORDER — ACETAMINOPHEN 325 MG PO TABS: 325.0000 mg | ORAL_TABLET | Freq: Once | ORAL | Status: DC

## 2015-05-20 MED ORDER — IPRATROPIUM-ALBUTEROL 0.5-2.5 (3) MG/3ML IN SOLN
3.0000 mL | Freq: Once | RESPIRATORY_TRACT | Status: AC
  Administered 2015-05-20: 3 mL via RESPIRATORY_TRACT
  Filled 2015-05-20: qty 3

## 2015-05-20 MED ORDER — HYDRALAZINE HCL 50 MG PO TABS: 50.0000 mg | ORAL_TABLET | Freq: Three times a day (TID) | ORAL | Status: DC

## 2015-05-20 MED ORDER — PREDNISONE 50 MG PO TABS: 50.0000 mg | ORAL_TABLET | Freq: Every day | ORAL | Status: DC

## 2015-05-20 MED ORDER — AMLODIPINE BESYLATE 10 MG PO TABS: ORAL_TABLET | ORAL | Status: DC

## 2015-05-20 MED ORDER — SODIUM CHLORIDE 0.9 % IV BOLUS (SEPSIS)
1000.0000 mL | Freq: Once | INTRAVENOUS | Status: AC
  Administered 2015-05-20: 1000 mL via INTRAVENOUS

## 2015-05-20 MED ORDER — HYDROCHLOROTHIAZIDE 25 MG PO TABS: 25.0000 mg | ORAL_TABLET | Freq: Every day | ORAL | Status: DC

## 2015-05-20 MED ORDER — ACETAMINOPHEN-CODEINE 120-12 MG/5ML PO SOLN: 10.0000 mL | ORAL | Status: DC | PRN

## 2015-05-20 NOTE — ED Notes (Signed)
Pt back from x-ray.

## 2015-05-20 NOTE — Discharge Instructions (Signed)
Return here as needed.  Increase her fluid intake, rest as much as possible.  Follow-up with your primary doctor.  Your chest x-ray and testing here today did not show any significant abnormality

## 2015-05-20 NOTE — ED Notes (Signed)
Pt sounds congested; lungs clear on auscultation. Denies cough and fever.

## 2015-05-20 NOTE — ED Provider Notes (Signed)
CSN: [id number]     Arrival date & time 05/18/15  1403 History   First MD Initiated Contact with Patient 05/18/15 1525     Chief Complaint  Patient presents with  . Eye redness   . Nasal Congestion  . Cough  . Sore Throat     (Consider location/radiation/quality/duration/timing/severity/associated sxs/prior Treatment) HPI.... Patient complains of red eyes, nasal congestion, body aches, sore throat, cough for 2 days. She is able to keep fluids down. Severity is mild to moderate. Nothing makes symptoms better or worse. No fever, stiff neck, neurological deficits, chest pain, dyspnea.  Past Medical History  Diagnosis Date  . Hypertension   . Malignant hypertension 10/13/2013  . Acute renal failure 10/13/2013    On Vancomycin  . Breast abscess of female     Status post biopsy   Past Surgical History  Procedure Laterality Date  . Biopsy of left breast    . Incision and drainage abscess Left 10/11/2013    Procedure: INCISION AND DRAINAGE and debridement LEFT BREAST ABSCESS;  Surgeon: Odis Hollingshead, MD;  Location: [facility];  Service: General;  Laterality: Left;   Family History  Problem Relation Age of Onset  . Breast cancer Mother   . Cancer Mother     brain  . Hypertension Father   . Thyroid disease Daughter    History  Substance Use Topics  . Smoking status: Current Every Day Smoker -- 1.00 packs/day for 26 years    Types: Cigarettes  . Smokeless tobacco: Never Used  . Alcohol Use: No   OB History    Gravida Para Term Preterm AB TAB SAB Ectopic Multiple Living   1 1 1       1      Review of Systems  All other systems reviewed and are negative.     Allergies  Fish allergy; Peanuts; Other; and Vancomycin  Home Medications   Prior to Admission medications   Medication Sig Start Date End Date Taking? Authorizing Provider  amLODipine (NORVASC) 10 MG tablet TAKE 1 TABLET (10 MG TOTAL) BY MOUTH DAILY. 01/09/14  Yes Lorayne Marek, MD  hydrALAZINE (APRESOLINE) 50 MG  tablet Take 1 tablet (50 mg total) by mouth every 8 (eight) hours. 10/17/13  Yes Earnstine Regal, PA-C  hydrochlorothiazide (HYDRODIURIL) 25 MG tablet Take 1 tablet (25 mg total) by mouth daily. 12/05/13  Yes Tresa Garter, MD  ibuprofen (ADVIL,MOTRIN) 100 MG/5ML suspension Take 600 mg by mouth every 4 (four) hours as needed.   Yes Historical Provider, MD  potassium chloride (K-DUR) 10 MEQ tablet Take 1 tablet (10 mEq total) by mouth daily. Patient not taking: Reported on 05/18/2015 10/27/13   Tresa Garter, MD   BP 170/90 mmHg  Pulse 98  Temp(Src) 98.4 F (36.9 C) (Oral)  Resp 18  SpO2 100% Physical Exam  Constitutional: She is oriented to person, place, and time. She appears well-developed and well-nourished.  HENT:  Head: Normocephalic and atraumatic.  Eyes: Conjunctivae and EOM are normal. Pupils are equal, round, and reactive to light.  Conjunctiva are red, inflamed, draining clear drainage  Neck: Normal range of motion. Neck supple.  Cardiovascular: Normal rate and regular rhythm.   Pulmonary/Chest: Effort normal and breath sounds normal.  Abdominal: Soft. Bowel sounds are normal.  Musculoskeletal: Normal range of motion.  Neurological: She is alert and oriented to person, place, and time.  Skin: Skin is warm and dry.  Psychiatric: She has a normal mood and affect. Her behavior is normal.  Nursing note and vitals reviewed.   ED Course  Procedures (including critical care time) Labs Review Labs Reviewed  CBC WITH DIFFERENTIAL/PLATELET - Abnormal; Notable for the following:    WBC 15.7 (*)    Neutrophils Relative % 80 (*)    Neutro Abs 12.6 (*)    Lymphocytes Relative 7 (*)    Monocytes Relative 13 (*)    Monocytes Absolute 2.1 (*)    All other components within normal limits  COMPREHENSIVE METABOLIC PANEL - Abnormal; Notable for the following:    Sodium 134 (*)    Chloride 100 (*)    Calcium 8.7 (*)    ALT 11 (*)    All other components within normal  limits  RAPID STREP SCREEN (NOT AT [facility])  CULTURE, GROUP A STREP    Imaging Review No results found.   EKG Interpretation None      MDM   Final diagnoses:  Viral syndrome  Bilateral conjunctivitis  Essential hypertension   Patient feels  better after IV fluids. Clonidine orally given for blood pressure. Prescription for tobramycin eyedrops. Patient has primary care follow-up. She is nontoxic at discharge.     Nat Christen, MD 05/20/15 [phone]

## 2015-05-20 NOTE — ED Provider Notes (Signed)
CSN: [id number]     Arrival date & time 05/20/15  0800 History   First MD Initiated Contact with Patient 05/20/15 [phone]     Chief Complaint  Patient presents with  . Chest Pain     (Consider location/radiation/quality/duration/timing/severity/associated sxs/prior Treatment) HPI Patient presents to the emergency department with nasal congestion, cough and chest discomfort with coughing.  The patient states that she was seen [facility] on Friday evening where she was diagnosed with a viral illness.  She states that her nasal passages seem to get more swollen and that is what caused her to come back to the emergency department.  The patient states that nothing seems make her condition better or worse.  The patient denies shortness of breath, nausea, vomiting, weakness, dizziness, headache, blurred vision, back pain, neck pain, fever, dysuria, incontinence, or syncope.  The patient states that she did not take any medications prior to arrival.  Nothing seems make her condition better or worse Past Medical History  Diagnosis Date  . Hypertension   . Malignant hypertension 10/13/2013  . Acute renal failure 10/13/2013    On Vancomycin  . Breast abscess of female     Status post biopsy   Past Surgical History  Procedure Laterality Date  . Biopsy of left breast    . Incision and drainage abscess Left 10/11/2013    Procedure: INCISION AND DRAINAGE and debridement LEFT BREAST ABSCESS;  Surgeon: Odis Hollingshead, MD;  Location: [facility];  Service: General;  Laterality: Left;   Family History  Problem Relation Age of Onset  . Breast cancer Mother   . Cancer Mother     brain  . Hypertension Father   . Thyroid disease Daughter    History  Substance Use Topics  . Smoking status: Current Every Day Smoker -- 1.00 packs/day for 26 years    Types: Cigarettes  . Smokeless tobacco: Never Used  . Alcohol Use: No   OB History    Gravida Para Term Preterm AB TAB SAB Ectopic Multiple Living    1 1 1       1      Review of Systems  All other systems negative except as documented in the HPI. All pertinent positives and negatives as reviewed in the HPI.  Allergies  Fish allergy; Peanuts; Other; and Vancomycin  Home Medications   Prior to Admission medications   Medication Sig Start Date End Date Taking? Authorizing Provider  amLODipine (NORVASC) 10 MG tablet TAKE 1 TABLET (10 MG TOTAL) BY MOUTH DAILY. Patient not taking: Reported on 05/20/2015 01/09/14   Lorayne Marek, MD  hydrALAZINE (APRESOLINE) 50 MG tablet Take 1 tablet (50 mg total) by mouth every 8 (eight) hours. Patient not taking: Reported on 05/20/2015 10/17/13   Earnstine Regal, PA-C  hydrochlorothiazide (HYDRODIURIL) 25 MG tablet Take 1 tablet (25 mg total) by mouth daily. Patient not taking: Reported on 05/20/2015 12/05/13   Tresa Garter, MD  potassium chloride (K-DUR) 10 MEQ tablet Take 1 tablet (10 mEq total) by mouth daily. Patient not taking: Reported on 05/18/2015 10/27/13   Tresa Garter, MD   BP 226/107 mmHg  Pulse 87  Temp(Src) 98.5 F (36.9 C) (Oral)  Resp 19  SpO2 100% Physical Exam  Constitutional: She is oriented to person, place, and time. She appears well-developed and well-nourished. No distress.  HENT:  Head: Normocephalic and atraumatic.  Mouth/Throat: Oropharynx is clear and moist.  Eyes: Pupils are equal, round, and reactive to light.  Neck:  Normal range of motion. Neck supple.  Cardiovascular: Normal rate, regular rhythm and normal heart sounds.  Exam reveals no gallop and no friction rub.   No murmur heard. Pulmonary/Chest: Effort normal and breath sounds normal. No respiratory distress. She has no wheezes.  Musculoskeletal: She exhibits no edema.  Neurological: She is alert and oriented to person, place, and time. She exhibits normal muscle tone. Coordination normal.  Skin: Skin is warm and dry.  Psychiatric: She has a normal mood and affect. Her behavior is normal.   Nursing note and vitals reviewed.   ED Course  Procedures (including critical care time) Labs Review Labs Reviewed  CBC - Abnormal; Notable for the following:    WBC 14.9 (*)    RDW 15.6 (*)    All other components within normal limits  BASIC METABOLIC PANEL - Abnormal; Notable for the following:    Potassium 3.2 (*)    Chloride 100 (*)    Calcium 8.4 (*)    All other components within normal limits  I-STAT TROPOININ, ED  POC URINE PREG, ED    Imaging Review Dg Chest 2 View  05/20/2015   CLINICAL DATA:  Chest pain and congestion since Wednesday. History of hypertension and smoking.  EXAM: CHEST  2 VIEW  COMPARISON:  10/10/2013; 02/16/2013; 09/30/2010  FINDINGS: Grossly unchanged enlarged cardiac silhouette. No focal airspace opacities. There is minimal pleural parenchymal thickening about the bilateral major and the right minor fissures. No pleural effusion or pneumothorax. No evidence of edema. No acute osseus abnormalities.  IMPRESSION: Cardiomegaly without acute cardiopulmonary disease.   Electronically Signed   By: Sandi Mariscal M.D.   On: 05/20/2015 10:08     EKG Interpretation   Date/Time:  Sunday May 20 2015 08:07:12 EDT Ventricular Rate:  84 PR Interval:  142 QRS Duration: 96 QT Interval:  370 QTC Calculation: 437 R Axis:   -10 Text Interpretation:  Sinus rhythm LVH with secondary repolarization  abnormality No significant change since last tracing Confirmed by  Alvino Chapel  MD, Ovid Curd [phone]) on 05/20/2015 8:29:56 AM      The patient will be discharged home with a diagnosis of "viral type illness.  She has not had any fevers or in the emergency department not been tachycardic or hypotensive.  The patient has a negative chest x-ray feel her chest discomfort is due to chest wall pain  Dalia Heading, PA-C 05/20/15 [place], MD 05/20/15 [phone]

## 2015-05-20 NOTE — ED Notes (Signed)
Per EMS- pt was seen Friday for pink eye & URI at [facility]; pt began having central chest pain this morning; onset occurred while at rest. Pt has hx of HTN, has been out of meds for 8 days - for EMS BP 260/160, pt states this is normal. Pt has had 324 of aspirin and 1 nitro, chest pain relieved at current time. Lungs clear on auscultation. Pt is a/o x4.

## 2015-05-20 NOTE — ED Notes (Signed)
PA aware of pt BP. Is writing pt prescriptions for blood pressure meds. Pt instructed on importance of BP management and risk of stroke with pressure this high. Pt verbalizes understanding and is not showing any neuro deficits at current time.

## 2015-05-20 NOTE — ED Notes (Signed)
Pt transported to Xray. 

## 2015-05-20 NOTE — ED Notes (Signed)
Spoke with [place], [place] regarding pt BP; no recommendations at this time. Pt non-compliant with medications.

## 2015-05-22 LAB — CULTURE, GROUP A STREP

## 2015-05-29 ENCOUNTER — Inpatient Hospital Stay (HOSPITAL_COMMUNITY)
Admission: AD | Admit: 2015-05-29 | Discharge: 2015-05-29 | Discharge status: Against Medical Advice | Payer: Self-pay | Source: Ambulatory Visit | Attending: Family Medicine | Admitting: Family Medicine

## 2015-05-29 ENCOUNTER — Encounter (HOSPITAL_COMMUNITY): Payer: Self-pay

## 2015-05-29 DIAGNOSIS — I10 Essential (primary) hypertension: Secondary | ICD-10-CM

## 2015-05-29 DIAGNOSIS — T192XXA Foreign body in vulva and vagina, initial encounter: Secondary | ICD-10-CM

## 2015-05-29 DIAGNOSIS — F17210 Nicotine dependence, cigarettes, uncomplicated: Secondary | ICD-10-CM

## 2015-05-29 DIAGNOSIS — Z5321 Procedure and treatment not carried out due to patient leaving prior to being seen by health care provider: Secondary | ICD-10-CM

## 2015-05-29 LAB — COMPREHENSIVE METABOLIC PANEL
ALT: 13 U/L — ABNORMAL LOW (ref 14–54)
AST: 19 U/L (ref 15–41)
Albumin: 3.6 g/dL (ref 3.5–5.0)
Alkaline Phosphatase: 63 U/L (ref 38–126)
Anion gap: 7 (ref 5–15)
BUN: 11 mg/dL (ref 6–20)
CO2: 32 mmol/L (ref 22–32)
Calcium: 8.8 mg/dL — ABNORMAL LOW (ref 8.9–10.3)
Chloride: 98 mmol/L — ABNORMAL LOW (ref 101–111)
Creatinine, Ser: 0.88 mg/dL (ref 0.44–1.00)
GFR calc Af Amer: >60 mL/min (ref >60)
GFR calc non Af Amer: >60 mL/min (ref >60)
Glucose, Bld: 127 mg/dL — ABNORMAL HIGH (ref 65–99)
Potassium: 3.3 mmol/L — ABNORMAL LOW (ref 3.5–5.1)
Sodium: 137 mmol/L (ref 135–145)
Total Bilirubin: 0.5 mg/dL (ref 0.3–1.2)
Total Protein: 7.4 g/dL (ref 6.5–8.1)

## 2015-05-29 MED ORDER — HYDRALAZINE HCL 20 MG/ML IJ SOLN
10.0000 mg | Freq: Once | INTRAMUSCULAR | Status: AC
  Administered 2015-05-29: 10 mg via INTRAVENOUS
  Filled 2015-05-29: qty 1

## 2015-05-29 MED ORDER — LABETALOL HCL 5 MG/ML IV SOLN
20.0000 mg | Freq: Once | INTRAVENOUS | Status: AC
  Administered 2015-05-29: 20 mg via INTRAVENOUS
  Filled 2015-05-29: qty 4

## 2015-05-29 MED ORDER — SODIUM CHLORIDE 0.9 % IV SOLN
INTRAVENOUS | Status: DC
  Administered 2015-05-29: 1000 mL/h via INTRAVENOUS

## 2015-05-29 NOTE — MAU Note (Signed)
Pt states she does not want to be transferred to [facility]. Will allow IV and IV BP meds to be given. Provider aware.

## 2015-05-29 NOTE — MAU Note (Signed)
Pt still refusing to be transferred via [facility] to [facility]. Risks discussed with patient. AMA form signed by patient.

## 2015-05-29 NOTE — Discharge Instructions (Signed)

## 2015-05-29 NOTE — MAU Provider Note (Signed)
History     CSN: [id number]  Arrival date and time: 05/29/15 1422   First Provider Initiated Contact with Patient 05/29/15 1627      Chief Complaint  Patient presents with  . Stuck tampon    HPI  49 y.o. year old presents to MAU stating that she cannot get a tampon out after putting a tampon in around 1200pm.  Past Medical History  Diagnosis Date  . Hypertension   . Malignant hypertension 10/13/2013  . Acute renal failure 10/13/2013    On Vancomycin  . Breast abscess of female     Status post biopsy    Past Surgical History  Procedure Laterality Date  . Biopsy of left breast    . Incision and drainage abscess Left 10/11/2013    Procedure: INCISION AND DRAINAGE and debridement LEFT BREAST ABSCESS;  Surgeon: Odis Hollingshead, MD;  Location: [facility];  Service: General;  Laterality: Left;    Family History  Problem Relation Age of Onset  . Breast cancer Mother   . Cancer Mother     brain  . Hypertension Father   . Thyroid disease Daughter     History  Substance Use Topics  . Smoking status: Current Every Day Smoker -- 1.00 packs/day for 26 years    Types: Cigarettes  . Smokeless tobacco: Never Used  . Alcohol Use: No    Allergies:  Allergies  Allergen Reactions  . Fish Allergy Anaphylaxis  . Peanuts [Peanut Oil] Anaphylaxis  . Other Other (See Comments)    Anesthesia.  Specific agent unknown.  Reaction unknown.   . Vancomycin Other (See Comments)    Nephrotoxicity    Prescriptions prior to admission  Medication Sig Dispense Refill Last Dose  . amLODipine (NORVASC) 10 MG tablet TAKE 1 TABLET (10 MG TOTAL) BY MOUTH DAILY. (Patient taking differently: Take 10 mg by mouth daily. TAKE 1 TABLET (10 MG TOTAL) BY MOUTH DAILY.) 30 tablet 0 05/28/2015 at Unknown time  . Guaifenesin 1200 MG TB12 Take 1 tablet (1,200 mg total) by mouth 2 (two) times daily. 20 each 0 Past Week at Unknown time  . hydrALAZINE (APRESOLINE) 50 MG tablet Take 1 tablet (50 mg total) by mouth  every 8 (eight) hours. 90 tablet 0 05/28/2015 at Unknown time  . hydrochlorothiazide (HYDRODIURIL) 25 MG tablet Take 1 tablet (25 mg total) by mouth daily. 30 tablet 1 05/28/2015 at Unknown time  . predniSONE (DELTASONE) 50 MG tablet Take 1 tablet (50 mg total) by mouth daily. 5 tablet 0 05/28/2015 at Unknown time  . acetaminophen-codeine 120-12 MG/5ML solution Take 10 mLs by mouth every 4 (four) hours as needed for moderate pain (and cough). (Patient not taking: Reported on 05/29/2015) 120 mL 0 Not Taking at Unknown time    Review of Systems  Constitutional: Negative for fever.  Eyes: Negative for blurred vision.  Respiratory: Negative for shortness of breath.   Cardiovascular: Negative for chest pain and palpitations.  Genitourinary:       Stuck tampon   Neurological: Negative for headaches.  All other systems reviewed and are negative.  Physical Exam   Blood pressure 218/94, pulse 75, temperature 98.3 F (36.8 C), temperature source Oral, resp. rate 18, last menstrual period 05/26/2015. 05/29/15 1735  --  79  --  --  180/77 mmHg  --  --  --  -- DS     05/29/15 1724  --  77  --  --  164/70 mmHg  --  --  --  --  DS    05/29/15 1644  --  84  --  --   227/127 mmHg  --  --  --  -- DS    05/29/15 1621  --  75  --  18   218/94 mmHg  --  --  --  -- DS    05/29/15 1529  --  71  --  18   206/97 mmHg  --  --  --  -- VM    05/29/15 1448  98.3 F (36.8 C)  --  --  20   220/119 mmHg  --            Physical Exam  Nursing note and vitals reviewed. Constitutional: She is oriented to person, place, and time. She appears well-developed and well-nourished. No distress.  HENT:  Head: Normocephalic and atraumatic.  Neck: Normal range of motion.  Cardiovascular: Normal rate.   Respiratory: Effort normal. No respiratory distress.  GI: Soft.  Genitourinary: Vagina normal.  Spec exam reveals no tampon; Vaginal exam does not reveal a retained tampon.  Musculoskeletal: Normal range of motion. She  exhibits no edema.  Neurological: She is alert and oriented to person, place, and time.  Skin: Skin is warm and dry.  Psychiatric: She has a normal mood and affect. Her behavior is normal. Judgment and thought content normal.   Results for orders placed or performed during the hospital encounter of 05/29/15 (from the past 24 hour(s))  Comprehensive metabolic panel     Status: Abnormal   Collection Time: 05/29/15  4:58 PM  Result Value Ref Range   Sodium 137 135 - 145 mmol/L   Potassium 3.3 (L) 3.5 - 5.1 mmol/L   Chloride 98 (L) 101 - 111 mmol/L   CO2 32 22 - 32 mmol/L   Glucose, Bld 127 (H) 65 - 99 mg/dL   BUN 11 6 - 20 mg/dL   Creatinine, Ser 0.88 0.44 - 1.00 mg/dL   Calcium 8.8 (L) 8.9 - 10.3 mg/dL   Total Protein 7.4 6.5 - 8.1 g/dL   Albumin 3.6 3.5 - 5.0 g/dL   AST 19 15 - 41 U/L   ALT 13 (L) 14 - 54 U/L   Alkaline Phosphatase 63 38 - 126 U/L   Total Bilirubin 0.5 0.3 - 1.2 mg/dL   GFR calc non Af Amer >60 >60 mL/min   GFR calc Af Amer >60 >60 mL/min   Anion gap 7 5 - 15   MAU Course  Procedures  MDM Speculum exam and vaginal exam does not reveal a retained tampon; Pt has severely elevated blood pressures and she was advised to get labs, CMP and IV antihypertensive medications and be transferred to [facility]. Pt stated that she would take the IV and medications but she refused to be transferred. She was counseled that she could have a stroke and bleed into her brain and die and she was going against medical advice. She did sign a AMA form. Dr Nehemiah Settle was aware of situation with pt.   Assessment and Plan  R/O retained Tampon Hypertension  Signed out AMA- refused to be transferred to [facility].  Yohana Bartha Grissett 05/29/2015, 4:34 PM

## 2015-05-29 NOTE — MAU Note (Signed)
Pt states she put her tampon in backward this a.m.  Denies pain.

## 2016-08-25 ENCOUNTER — Emergency Department (HOSPITAL_COMMUNITY)
Admission: EM | Admit: 2016-08-25 | Discharge: 2016-08-25 | Disposition: A | Discharge status: Home / Self Care | Payer: Self-pay | Attending: Emergency Medicine | Admitting: Emergency Medicine

## 2016-08-25 ENCOUNTER — Encounter (HOSPITAL_COMMUNITY): Payer: Self-pay | Admitting: Emergency Medicine

## 2016-08-25 DIAGNOSIS — I129 Hypertensive chronic kidney disease with stage 1 through stage 4 chronic kidney disease, or unspecified chronic kidney disease: Secondary | ICD-10-CM | POA: Insufficient documentation

## 2016-08-25 DIAGNOSIS — E876 Hypokalemia: Secondary | ICD-10-CM | POA: Insufficient documentation

## 2016-08-25 DIAGNOSIS — Z9119 Patient's noncompliance with other medical treatment and regimen: Secondary | ICD-10-CM | POA: Insufficient documentation

## 2016-08-25 DIAGNOSIS — R51 Headache: Secondary | ICD-10-CM | POA: Insufficient documentation

## 2016-08-25 DIAGNOSIS — N189 Chronic kidney disease, unspecified: Secondary | ICD-10-CM | POA: Insufficient documentation

## 2016-08-25 DIAGNOSIS — I10 Essential (primary) hypertension: Secondary | ICD-10-CM

## 2016-08-25 DIAGNOSIS — Z91010 Allergy to peanuts: Secondary | ICD-10-CM | POA: Insufficient documentation

## 2016-08-25 DIAGNOSIS — F17210 Nicotine dependence, cigarettes, uncomplicated: Secondary | ICD-10-CM | POA: Insufficient documentation

## 2016-08-25 LAB — I-STAT CHEM 8, ED
BUN: 20 mg/dL (ref 6–20)
CALCIUM ION: 0.95 mmol/L — AB (ref 1.15–1.40)
Chloride: 97 mmol/L — ABNORMAL LOW (ref 101–111)
Creatinine, Ser: 1.1 mg/dL — ABNORMAL HIGH (ref 0.44–1.00)
Glucose, Bld: 84 mg/dL (ref 65–99)
HCT: 41 % (ref 36.0–46.0)
Hemoglobin: 13.9 g/dL (ref 12.0–15.0)
Potassium: 7.7 mmol/L (ref 3.5–5.1)
Sodium: 133 mmol/L — ABNORMAL LOW (ref 135–145)
TCO2: 36 mmol/L (ref 0–100)

## 2016-08-25 LAB — POTASSIUM: Potassium: 3.1 mmol/L — ABNORMAL LOW (ref 3.5–5.1)

## 2016-08-25 MED ORDER — POTASSIUM CHLORIDE ER 10 MEQ PO TBCR: 10.0000 meq | EXTENDED_RELEASE_TABLET | Freq: Every day | ORAL | 0 refills | Status: DC

## 2016-08-25 MED ORDER — ACETAMINOPHEN 500 MG PO TABS
1000.0000 mg | ORAL_TABLET | Freq: Once | ORAL | Status: AC
  Administered 2016-08-25: 1000 mg via ORAL
  Filled 2016-08-25: qty 2

## 2016-08-25 MED ORDER — BISOPROLOL-HYDROCHLOROTHIAZIDE 5-6.25 MG PO TABS
1.0000 | ORAL_TABLET | Freq: Once | ORAL | Status: AC
  Administered 2016-08-25: 1 via ORAL
  Filled 2016-08-25 (×2): qty 1

## 2016-08-25 MED ORDER — BISOPROLOL-HYDROCHLOROTHIAZIDE 5-6.25 MG PO TABS: 1.0000 | ORAL_TABLET | Freq: Every day | ORAL | 0 refills | Status: DC

## 2016-08-25 MED ORDER — POTASSIUM CHLORIDE CRYS ER 20 MEQ PO TBCR
40.0000 meq | EXTENDED_RELEASE_TABLET | Freq: Once | ORAL | Status: AC
  Administered 2016-08-25: 40 meq via ORAL
  Filled 2016-08-25: qty 2

## 2016-08-25 NOTE — Progress Notes (Signed)
EDCM spoke to patient at bedside. Patient confirms she does not have a pcp or insurance living in [place].  [facility] provided patient with contact information to [facility], informed patient of services there and walk in times.  EDCM also provided patient with list of pcps who accept self pay patients, list of discount pharmacies and websites needymeds.org and GoodRX.com for medication assistance, phone number to inquire about the orange card, phone number to inquire about [employer], phone number to inquire about the [place], financial resources in the community such as local churches, [employer], urban ministries, and dental assistance for uninsured patients.  Patient thankful for resources.  No further EDCM needs at this time.  EDCM has received patient's permission to email the [facility] in efforts to obtain an appointment.

## 2016-08-25 NOTE — ED Provider Notes (Signed)
[facility] DEPT Provider Note   CSN: [id number] Arrival date & time: 08/25/16  1740     History   Chief Complaint Chief Complaint  Patient presents with  . Headache  . Hypertension    HPI Tracie Estrada is a 50 y.o. female.  HPI Complains of mild headache diffuse, gradual onset this morning. Nothing makes symptoms better or worse. She gets similar headaches almost weekly. No other associated symptoms. Patient admits to noncompliance with blood pressure medication for several months. Denies visual change no fever no trauma pain is mild. Nothing makes symptoms better or worse. denies chest pain no other associated symptoms no treatment prior to coming here. Past Medical History:  Diagnosis Date  . Acute renal failure ([place]) 10/13/2013   On Vancomycin  . Breast abscess of female    Status post biopsy  . Hypertension   . Malignant hypertension 10/13/2013    Patient Active Problem List   Diagnosis Date Noted  . Accelerated hypertension 10/25/2013  . Chronic kidney disease 10/25/2013  . Breast abscess of female 10/25/2013  . Malignant hypertension 10/13/2013  . Acute renal failure ([place]) 10/13/2013  . Hypokalemia 10/13/2013  . Left breast abscess 10/10/2013  . Mastitis 10/10/2013  . Discharge of breast 09/27/2013  . Breast mass, left 09/27/2013  . HYPERLIPIDEMIA 08/06/2010  . ANXIETY 08/06/2010  . SMOKER 08/06/2010  . Essential hypertension, benign 08/06/2010    Past Surgical History:  Procedure Laterality Date  . Biopsy of left breast    . INCISION AND DRAINAGE ABSCESS Left 10/11/2013   Procedure: INCISION AND DRAINAGE and debridement LEFT BREAST ABSCESS;  Surgeon: Odis Hollingshead, MD;  Location: [facility];  Service: General;  Laterality: Left;    OB History    Gravida Para Term Preterm AB Living   1 1 1     1    SAB TAB Ectopic Multiple Live Births                   Home Medications    Prior to Admission medications   Medication Sig Start Date End Date  Taking? Authorizing Provider  amLODipine (NORVASC) 10 MG tablet TAKE 1 TABLET (10 MG TOTAL) BY MOUTH DAILY. Patient not taking: Reported on 08/25/2016 05/20/15   Dalia Heading, PA-C  hydrALAZINE (APRESOLINE) 50 MG tablet Take 1 tablet (50 mg total) by mouth every 8 (eight) hours. Patient not taking: Reported on 08/25/2016 05/20/15   Dalia Heading, PA-C  hydrochlorothiazide (HYDRODIURIL) 25 MG tablet Take 1 tablet (25 mg total) by mouth daily. Patient not taking: Reported on 08/25/2016 05/20/15   Dalia Heading, PA-C    Family History Family History  Problem Relation Age of Onset  . Breast cancer Mother   . Cancer Mother     brain  . Hypertension Father   . Thyroid disease Daughter     Social History Social History  Substance Use Topics  . Smoking status: Current Every Day Smoker    Packs/day: 1.00    Years: 26.00    Types: Cigarettes  . Smokeless tobacco: Never Used  . Alcohol use No     Allergies   Fish allergy; Peanuts [peanut oil]; Other; and Vancomycin   Review of Systems Review of Systems  Constitutional: Negative.   Respiratory: Negative.   Cardiovascular: Negative.   Gastrointestinal: Negative.   Musculoskeletal: Negative.   Skin: Negative.   Neurological: Positive for headaches.  Psychiatric/Behavioral: Negative.   All other systems reviewed and are negative.    Physical Exam  Updated Vital Signs BP (!) 220/111 (BP Location: Left Arm)   Pulse 90 Comment: Simultaneous filing. User may not have seen previous data.  Temp 98.5 F (36.9 C)   Resp 18   SpO2 99% Comment: Simultaneous filing. User may not have seen previous data.  Physical Exam  Constitutional: She is oriented to person, place, and time. She appears well-developed and well-nourished. No distress.  HENT:  Head: Normocephalic and atraumatic.  Fundi not well visualized  Eyes: Conjunctivae are normal. Pupils are equal, round, and reactive to light.  Neck: Neck supple. No tracheal  deviation present. No thyromegaly present.  Cardiovascular: Normal rate and regular rhythm.   No murmur heard. Pulmonary/Chest: Effort normal and breath sounds normal.  Abdominal: Soft. Bowel sounds are normal. She exhibits no distension. There is no tenderness.  Musculoskeletal: Normal range of motion. She exhibits no edema or tenderness.  Neurological: She is alert and oriented to person, place, and time. She displays normal reflexes. Coordination normal.  DTRs symmetric bilaterally knee jerk ankle jerk and biceps toes downward going bilaterally gait normal [place] normal pronator drift normal  Skin: Skin is warm and dry. No rash noted.  Psychiatric: She has a normal mood and affect.  Nursing note and vitals reviewed.    ED Treatments / Results  Labs (all labs ordered are listed, but only abnormal results are displayed) Labs Reviewed  I-STAT CHEM 8, ED    EKG  EKG Interpretation None       Radiology No results found.  Procedures Procedures (including critical care time)  Medications Ordered in ED Medications  acetaminophen (TYLENOL) tablet 1,000 mg (not administered)    Results for orders placed or performed during the hospital encounter of 08/25/16  Potassium  Result Value Ref Range   Potassium 3.1 (L) 3.5 - 5.1 mmol/L  I-stat chem 8, ed  Result Value Ref Range   Sodium 133 (L) 135 - 145 mmol/L   Potassium 7.7 (HH) 3.5 - 5.1 mmol/L   Chloride 97 (L) 101 - 111 mmol/L   BUN 20 6 - 20 mg/dL   Creatinine, Ser 1.10 (H) 0.44 - 1.00 mg/dL   Glucose, Bld 84 65 - 99 mg/dL   Calcium, Ion 0.95 (L) 1.15 - 1.40 mmol/L   TCO2 36 0 - 100 mmol/L   Hemoglobin 13.9 12.0 - 15.0 g/dL   HCT 41.0 36.0 - 46.0 %   Comment NOTIFIED PHYSICIAN    No results found. Initial Impression / Assessment and Plan / ED Course  I have reviewed the triage vital signs and the nursing notes.  Pertinent labs & imaging results that were available during my care of the patient were reviewed by me  and considered in my medical decision making (see chart for details).  Clinical Course    Feels improved after treatment with Bisoprpolol-hctz. And Tylenol. No longer has headache. Feels ready to go home. Patient has chronic hypokalemia. Plan prescription Ziac, K Dur. Blood pressure recheck 1 week. She's been referred by our case manager for to get PMD. I counseled patient for 5 minutes on smoking cessation No signs of hypertensive emergency Final Clinical Impressions(s) / ED Diagnoses  Diagnoses #1 hypertension #2 hypokalemia #3 tobacco abuse #4 medication noncompliance Final diagnoses:  None    New Prescriptions New Prescriptions   No medications on file     Orlie Dakin, MD 08/25/16 2136

## 2016-08-25 NOTE — ED Notes (Signed)
Pt ambulated to restroom. 

## 2016-08-25 NOTE — Discharge Instructions (Signed)
Take your medication as prescribed start tomorrow. Get your blood pressure and blood potassium rechecked within a week at the clinic or doctor's office that you were referred to tonight. Ask your new primary care physician to help you to stop smoking. Return if you feel worse for any reason.

## 2016-08-25 NOTE — ED Triage Notes (Signed)
Pt presents to ED c/o headache since 12pm today. Pt has hx of hypertension and hasn't been able to afford her BP medication for the last two years. Pt sts she has been having headaches for awhile. Denies N/V, dizziness, blurry vision, double vision, altered mental status. A&Ox4 and ambulatory.

## 2016-08-27 ENCOUNTER — Telehealth: Payer: Self-pay

## 2016-08-27 MED FILL — BISOPROLOL-HCTZ 5-6.25 MG T: 5-6.25 | 30 days supply | Qty: 30 | Fill #0

## 2016-08-27 MED FILL — POTASSIUM CL 10 MEQ TAB SA: 10 | 30 days supply | Qty: 30 | Fill #0

## 2016-08-27 NOTE — Telephone Encounter (Signed)
Message received from Livia Snellen, RN CM requesting a hospital follow up appointment for the patient. An appointment was scheduled for 09/01/16 @ 1000.  She was very appreciative of the call and stated that she will also be coming to the clinic tomorrow to pick up her medications at the pharmacy.   Update provided to A. Christen Bame, RN CM

## 2016-09-01 ENCOUNTER — Inpatient Hospital Stay: Payer: Self-pay

## 2016-09-04 ENCOUNTER — Ambulatory Visit: Payer: Self-pay | Attending: Internal Medicine | Admitting: Physician Assistant

## 2016-09-04 VITALS — BP 240/129 | HR 73 | Temp 98.1°F | Resp 16 | Wt 177.2 lb

## 2016-09-04 DIAGNOSIS — Z79899 Other long term (current) drug therapy: Secondary | ICD-10-CM | POA: Insufficient documentation

## 2016-09-04 DIAGNOSIS — Z888 Allergy status to other drugs, medicaments and biological substances status: Secondary | ICD-10-CM | POA: Insufficient documentation

## 2016-09-04 DIAGNOSIS — G932 Benign intracranial hypertension: Secondary | ICD-10-CM | POA: Insufficient documentation

## 2016-09-04 DIAGNOSIS — I10 Essential (primary) hypertension: Secondary | ICD-10-CM | POA: Insufficient documentation

## 2016-09-04 MED ORDER — LISINOPRIL-HYDROCHLOROTHIAZIDE 20-25 MG PO TABS: 1.0000 | ORAL_TABLET | Freq: Every day | ORAL | 3 refills | Status: DC

## 2016-09-04 MED ORDER — AMLODIPINE BESYLATE 10 MG PO TABS: ORAL_TABLET | ORAL | 5 refills | Status: DC

## 2016-09-04 MED ORDER — HYDRALAZINE HCL 50 MG PO TABS: 50.0000 mg | ORAL_TABLET | Freq: Three times a day (TID) | ORAL | 5 refills | Status: DC

## 2016-09-04 MED FILL — hydrALAZINE HCL 50 MG TABS: 50 | 30 days supply | Qty: 90 | Fill #0

## 2016-09-04 MED FILL — LISINOPRIL-HCTZ 20-25 MG TA: 20-25 | 30 days supply | Qty: 30 | Fill #0

## 2016-09-04 MED FILL — ?AMLODIPINE BESYLATE 10 MG: 10 | 30 days supply | Qty: 30 | Fill #0

## 2016-09-04 NOTE — Progress Notes (Signed)
Tracie Estrada, is a 50 y.o. female  [id number]  [id number]  DOB - Jul 17, 1966  Subjective:  Chief Complaint and HPI: Tracie Estrada is a 50 y.o. female here today to re-establish care and for a follow up visit after being seen in the ED for uncontrolled htn.  She has not been seen in our office in almost 3 years.  She says she has not taken BP meds in at least over a year.  She denies any s/sx associated with htn.  She denies CP, SOB, palpitations, or vision changes.  She has not been taking the potassium that was prescribed in the ED.  She was started on Ziac for her BP and says she has been taking it at night for the last few nights. She has refused Clonidine in office.  After further discussion, she says she has a history of Pseudotumor and having to have LP >20 years ago and took meds for this.  She even had to have an "eye surgery" as a complication of this.  She has not seen a neurologist in >20 years.  She denies regular or frequent HAs and denies vision changes.    ED/Hospital notes reviewed.     ROS:   Constitutional:  No f/c, No night sweats, No unexplained weight loss. EENT:  No vision changes, No blurry vision, No hearing changes. No mouth, throat, or ear problems.  Respiratory: No cough, No SOB Cardiac: No CP, no palpitations GI:  No abd pain, No N/V/D. GU: No Urinary s/sx Musculoskeletal: No joint pain Neuro: No headache, no dizziness, no motor weakness.  Skin: No rash Endocrine:  No polydipsia. No polyuria.  Psych: Denies SI/HI  No problems updated.  ALLERGIES: Allergies  Allergen Reactions  . Fish Allergy Anaphylaxis  . Peanuts [Peanut Oil] Anaphylaxis  . Other Other (See Comments)    Anesthesia.  Specific agent unknown.  Reaction unknown.   . Vancomycin Other (See Comments)    Nephrotoxicity    PAST MEDICAL HISTORY: Past Medical History:  Diagnosis Date  . Acute renal failure ([place]) 10/13/2013   On Vancomycin  . Breast abscess of female    Status post  biopsy  . Hypertension   . 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 Authorizing Provider  potassium chloride (K-DUR) 10 MEQ tablet Take 1 tablet (10 mEq total) by mouth daily. 08/25/16  Yes Sam Winfred Leeds, MD  amLODipine (NORVASC) 10 MG tablet TAKE 1 TABLET (10 MG TOTAL) BY MOUTH DAILY. 09/04/16   Argentina Donovan, PA-C  hydrALAZINE (APRESOLINE) 50 MG tablet Take 1 tablet (50 mg total) by mouth every 8 (eight) hours. 09/04/16   Argentina Donovan, PA-C  lisinopril-hydrochlorothiazide (PRINZIDE,ZESTORETIC) 20-25 MG tablet Take 1 tablet by mouth daily. 09/04/16   Argentina Donovan, PA-C     Objective:  EXAM:   Vitals:   09/04/16 1047  BP: (!) 240/129  Pulse: 73  Resp: 16  Temp: 98.1 F (36.7 C)  TempSrc: Oral  SpO2: 95%  Weight: 177 lb 3.2 oz (80.4 kg)    General appearance : A&OX3. NAD. Non-toxic-appearing HEENT: Atraumatic and Normocephalic.  PERRLA. EOM intact.  TM clear B. Mouth-MMM, post pharynx WNL w/o erythema, No PND. Neck: supple, no JVD. No cervical lymphadenopathy. No thyromegaly Chest/Lungs:  Breathing-non-labored, Good air entry bilaterally, breath sounds normal without rales, rhonchi, or wheezing  CVS: S1 S2 regular, no murmurs, gallops, rubs.  No carotid bruit  Extremities: Bilateral Lower Ext shows no edema, both legs are warm to touch with = pulse throughout Neurology:  CN II-XII grossly intact, Non focal.   Psych:  TP linear. J/I WNL. Normal speech. Appropriate eye contact and affect.  Skin:  No Rash   Assessment & Plan   1. Malignant hypertension Stop ziac.  Discussed with Dr. Leary Roca: - amLODipine (NORVASC) 10 MG tablet; TAKE 1 TABLET (10 MG TOTAL) BY MOUTH DAILY.  Dispense: 30 tablet; Refill: 5 - hydrALAZINE (APRESOLINE) 50 MG tablet; Take 1 tablet (50 mg total) by mouth every 8 (eight) hours.  Dispense: 90 tablet; Refill: 5 - lisinopril-hydrochlorothiazide  (PRINZIDE,ZESTORETIC) 20-25 MG tablet; Take 1 tablet by mouth daily.  Dispense: 90 tablet; Refill: 3 Take Potassium as prescribed-check labs next visit  2. Pseudotumor cerebri by history with no follow-up/treatment for this for more than 20 years. She is currently having no headaches or vision changes - Ambulatory referral to Neurology  I spent 50 mins with the patient and her adult daughter discussing the dangers of untreated htn.  We offered Clonidine or IV lasix and the patient adamantly refused.  We discussed at length that she should not drive until her BP is under adequate control.  She reluctanaly agreed to the treatment regimen we have recommended.  I explained to her at length that she is at extremely high risk for cardiac or cerebrovascular event currently.  She expresses understanding.      Patient have been counseled extensively about nutrition and exercise  Return in about 3 weeks (around 09/25/2016) for f/up with Dr Doreene Burke; f/up htn.  The patient was given clear instructions to go to ER or return to medical center if symptoms don't improve, worsen or new problems develop. The patient verbalized understanding. The patient was told to call to get lab results if they haven't heard anything in the next week.     Freeman Caldron, PA-C [facility] [place] [facility], [place]   09/04/2016, 2:15 PMPatient ID: Tracie Estrada, female   DOB: [birth year], 50 y.o.   MRN: [id number]

## 2016-09-04 NOTE — Progress Notes (Signed)
Pt is in the office today for ED follow up on hypertension Pt states her pain level today in the office is a 2 Pt states she might slept on her neck wrong Pt states she is taking medication without difficulty  Pt states she takes bp medication at night Pt states her heart beats fast at night Pt states her bp was higher than this in the ED

## 2016-09-04 NOTE — Patient Instructions (Signed)
Heart Disease Prevention Heart disease is a leading cause of death. There are many things you can do to help prevent heart disease. BE PHYSICALLY ACTIVE Physical activity is good for your heart. It helps control your blood pressure, cholesterol levels, and weight. Try to be physically active every day. Ask your health care provider what activities are best for you.  BE A HEALTHY WEIGHT Extra weight can strain your heart and affect your blood pressure and cholesterol levels. Lose weight with diet and exercise if recommended by your health care provider. EAT HEART-HEALTHY FOODS Follow a healthy eating plan as recommended by your health care provider or dietitian. Heart-healthy foods include:   High-fiber foods. These include oat bran, oatmeal, and whole-grain breads and cereals.  Fruits and vegetables. Avoid:  Alcohol.  Fried foods.  Foods high in saturated fat. These include meats, butter, whole dairy products, shortening, and coconut or palm oil.  Salty foods. These include canned food, luncheon meat, salty snacks, and fast food. KEEP YOUR CHOLESTEROL LEVELS UNDER CONTROL Cholesterol is a substance that is used for many important functions. When your cholesterol levels are high, cholesterol can stick to the insides of your blood vessels, making them narrow or clog. This can lead to chest pain (angina) and a heart attack.  Keep your cholesterol levels under control as recommended by your health care provider. Have your cholesterol checked at least once a year. Target cholesterol levels (in mg/dL) for most people are:   Total cholesterol below 200.  LDL cholesterol below 100.  HDL cholesterol above 40 in men and above 50 in women.  Triglycerides below 150. KEEP YOUR BLOOD PRESSURE UNDER CONTROL Having high blood pressure (hypertension) puts you at risk for stroke and other forms of heart disease. Keep your blood pressure under control as recommended by your health care provider. Ask  your health care provider if you need treatment to lower your blood pressure. If you are 69-53 years of age, have your blood pressure checked every 3-5 years. If you are 77 years of age or older, have your blood pressure checked every year. DO NOT USE TOBACCO PRODUCTS Tobacco smoke can damage your heart and blood vessels. Do not use any tobacco products including cigarettes, chewing tobacco, or electronic cigarettes. If you need help quitting, ask your health care provider. TAKE MEDICINES AS DIRECTED Take medicines only as directed by your health care provider. Ask your health care provider whether you should take an aspirin every day. Taking aspirin can help reduce your risk of heart disease and stroke.  FOR MORE INFORMATION  To find out more about heart disease, visit the American Heart Association's website at www.americanheart.org   This information is not intended to replace advice given to you by your health care provider. Make sure you discuss any questions you have with your health care provider.   Document Released: 06/24/2004 Document Revised: 12/01/2014 Document Reviewed: 01/04/2014 Elsevier Interactive Patient Education [employer].
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
## 2016-10-08 ENCOUNTER — Ambulatory Visit (INDEPENDENT_AMBULATORY_CARE_PROVIDER_SITE_OTHER): Payer: Self-pay | Admitting: Neurology

## 2016-10-08 ENCOUNTER — Encounter: Payer: Self-pay | Admitting: Neurology

## 2016-10-08 VITALS — BP 210/102 | HR 80 | Ht 59.0 in | Wt 177.2 lb

## 2016-10-08 DIAGNOSIS — I10 Essential (primary) hypertension: Secondary | ICD-10-CM

## 2016-10-08 DIAGNOSIS — Z720 Tobacco use: Secondary | ICD-10-CM

## 2016-10-08 DIAGNOSIS — G932 Benign intracranial hypertension: Secondary | ICD-10-CM

## 2016-10-10 ENCOUNTER — Telehealth: Payer: Self-pay | Admitting: Neurology

## 2016-10-10 NOTE — Telephone Encounter (Signed)
Spoke with patient and let her know that no eye doctors take cone assistance. She will set up eye exam at a [facility] and let us know when that has been done.

## 2016-10-30 ENCOUNTER — Ambulatory Visit: Payer: Self-pay | Admitting: Internal Medicine

## 2016-11-10 MED FILL — LISINOPRIL-HCTZ 20-25 MG TA: 20-25 | 30 days supply | Qty: 30 | Fill #1

## 2016-11-10 MED FILL — ?AMLODIPINE BESYLATE 10 MG: 10 | 30 days supply | Qty: 30 | Fill #1

## 2016-11-20 ENCOUNTER — Other Ambulatory Visit: Payer: Self-pay

## 2016-11-20 ENCOUNTER — Encounter: Payer: Self-pay | Admitting: Internal Medicine

## 2016-11-20 ENCOUNTER — Ambulatory Visit: Payer: Self-pay | Attending: Internal Medicine | Admitting: Internal Medicine

## 2016-11-20 VITALS — BP 190/110 | HR 100 | Temp 98.4°F | Resp 18 | Ht 59.0 in | Wt 180.0 lb

## 2016-11-20 DIAGNOSIS — Z7982 Long term (current) use of aspirin: Secondary | ICD-10-CM | POA: Insufficient documentation

## 2016-11-20 DIAGNOSIS — R059 Cough, unspecified: Secondary | ICD-10-CM

## 2016-11-20 DIAGNOSIS — R05 Cough: Secondary | ICD-10-CM

## 2016-11-20 DIAGNOSIS — N393 Stress incontinence (female) (male): Secondary | ICD-10-CM | POA: Insufficient documentation

## 2016-11-20 DIAGNOSIS — I10 Essential (primary) hypertension: Secondary | ICD-10-CM

## 2016-11-20 DIAGNOSIS — F17200 Nicotine dependence, unspecified, uncomplicated: Secondary | ICD-10-CM

## 2016-11-20 MED ORDER — HYDROCHLOROTHIAZIDE 25 MG PO TABS: 25.0000 mg | ORAL_TABLET | Freq: Every day | ORAL | 3 refills | Status: DC

## 2016-11-20 MED ORDER — CLONIDINE HCL 0.2 MG PO TABS
0.2000 mg | ORAL_TABLET | Freq: Once | ORAL | Status: AC
  Administered 2016-11-20: 0.2 mg via ORAL

## 2016-11-20 MED ORDER — GUAIFENESIN-DM 100-10 MG/5ML PO SYRP: 5.0000 mL | ORAL_SOLUTION | ORAL | 0 refills | Status: DC | PRN

## 2016-11-20 MED ORDER — ASPIRIN EC 81 MG PO TBEC: 81.0000 mg | DELAYED_RELEASE_TABLET | Freq: Every day | ORAL | 3 refills | Status: AC

## 2016-11-20 MED ORDER — METOPROLOL TARTRATE 25 MG PO TABS: 25.0000 mg | ORAL_TABLET | Freq: Two times a day (BID) | ORAL | 3 refills | Status: DC

## 2016-11-20 MED FILL — ROBAFEN-DM SYRUP: 100-10 | 24 days supply | Qty: 480 | Fill #0

## 2016-11-20 MED FILL — HYDROCHLOROTHIAZIDE 25 MG T: 25 | 30 days supply | Qty: 30 | Fill #0

## 2016-11-20 MED FILL — METOPROLOL TARTRATE 25 MG T: 25 | 30 days supply | Qty: 60 | Fill #0

## 2016-11-20 NOTE — Addendum Note (Signed)
Addended by: Angelica Chessman E on: 11/20/2016 05:07 PM   Modules accepted: Orders

## 2016-11-20 NOTE — Progress Notes (Signed)
ERROR

## 2016-11-20 NOTE — Progress Notes (Signed)
Patient is here for HTN.  Patient also stated that this morning she woke up with a a sore throat symptoms.   Patient declined the Flu shot today.  Patient has no pain but a Headache.  Patient BP is 214/92  Heart rate 100  Patient tolerated oral med (clonidine) well today.

## 2016-11-20 NOTE — Patient Instructions (Signed)
Steps to Quit Smoking Smoking tobacco can be bad for your health. It can also affect almost every organ in your body. Smoking puts you and people around you at risk for many serious long-lasting (chronic) diseases. Quitting smoking is hard, but it is one of the best things that you can do for your health. It is never too late to quit. What are the benefits of quitting smoking? When you quit smoking, you lower your risk for getting serious diseases and conditions. They can include:  Lung cancer or lung disease.  Heart disease.  Stroke.  Heart attack.  Not being able to have children (infertility).  Weak bones (osteoporosis) and broken bones (fractures). If you have coughing, wheezing, and shortness of breath, those symptoms may get better when you quit. You may also get sick less often. If you are pregnant, quitting smoking can help to lower your chances of having a baby of low birth weight. What can I do to help me quit smoking? Talk with your doctor about what can help you quit smoking. Some things you can do (strategies) include:  Quitting smoking totally, instead of slowly cutting back how much you smoke over a period of time.  Going to in-person counseling. You are more likely to quit if you go to many counseling sessions.  Using resources and support systems, such as:  Online chats with a counselor.  Phone quitlines.  Printed self-help materials.  Support groups or group counseling.  Text messaging programs.  Mobile phone apps or applications.  Taking medicines. Some of these medicines may have nicotine in them. If you are pregnant or breastfeeding, do not take any medicines to quit smoking unless your doctor says it is okay. Talk with your doctor about counseling or other things that can help you. Talk with your doctor about using more than one strategy at the same time, such as taking medicines while you are also going to in-person counseling. This can help make quitting  easier. What things can I do to make it easier to quit? Quitting smoking might feel very hard at first, but there is a lot that you can do to make it easier. Take these steps:  Talk to your family and friends. Ask them to support and encourage you.  Call phone quitlines, reach out to support groups, or work with a counselor.  Ask people who smoke to not smoke around you.  Avoid places that make you want (trigger) to smoke, such as:  Bars.  Parties.  Smoke-break areas at work.  Spend time with people who do not smoke.  Lower the stress in your life. Stress can make you want to smoke. Try these things to help your stress:  Getting regular exercise.  Deep-breathing exercises.  Yoga.  Meditating.  Doing a body scan. To do this, close your eyes, focus on one area of your body at a time from head to toe, and notice which parts of your body are tense. Try to relax the muscles in those areas.  Download or buy apps on your mobile phone or tablet that can help you stick to your quit plan. There are many free apps, such as QuitGuide from the CDC (Centers for Disease Control and Prevention). You can find more support from smokefree.gov and other websites. This information is not intended to replace advice given to you by your health care provider. Make sure you discuss any questions you have with your health care provider. Document Released: 09/06/2009 Document Revised: 07/08/2016 Document   Reviewed: 03/27/2015 Elsevier Interactive Patient Education  2017 [place] DASH stands for "Dietary Approaches to Stop Hypertension." The DASH eating plan is a healthy eating plan that has been shown to reduce high blood pressure (hypertension). Additional health benefits may include reducing the risk of type 2 diabetes mellitus, heart disease, and stroke. The DASH eating plan may also help with weight loss. What do I need to know about the DASH eating plan? For the DASH eating  plan, you will follow these general guidelines:  Choose foods with less than 150 milligrams of sodium per serving (as listed on the food label).  Use salt-free seasonings or herbs instead of table salt or sea salt.  Check with your health care provider or pharmacist before using salt substitutes.  Eat lower-sodium products. These are often labeled as "low-sodium" or "no salt added."  Eat fresh foods. Avoid eating a lot of canned foods.  Eat more vegetables, fruits, and low-fat dairy products.  Choose whole grains. Look for the word "whole" as the first word in the ingredient list.  Choose fish and skinless chicken or [place] more often than red meat. Limit fish, poultry, and meat to 6 oz (170 g) each day.  Limit sweets, desserts, sugars, and sugary drinks.  Choose heart-healthy fats.  Eat more home-cooked food and less restaurant, buffet, and fast food.  Limit fried foods.  Do not fry foods. Cook foods using methods such as baking, boiling, grilling, and broiling instead.  When eating at a restaurant, ask that your food be prepared with less salt, or no salt if possible. What foods can I eat? Seek help from a dietitian for individual calorie needs. Grains  Whole grain or whole wheat bread. Brown rice. Whole grain or whole wheat pasta. Quinoa, bulgur, and whole grain cereals. Low-sodium cereals. Corn or whole wheat flour tortillas. Whole grain cornbread. Whole grain crackers. Low-sodium crackers. Vegetables  Fresh or frozen vegetables (raw, steamed, roasted, or grilled). Low-sodium or reduced-sodium tomato and vegetable juices. Low-sodium or reduced-sodium tomato sauce and paste. Low-sodium or reduced-sodium canned vegetables. Fruits  All fresh, canned (in natural juice), or frozen fruits. Meat and Other Protein Products  Ground beef (85% or leaner), grass-fed beef, or beef trimmed of fat. Skinless chicken or [place]. Ground chicken or [place]. Pork trimmed of fat. All fish and  seafood. Eggs. Dried beans, peas, or lentils. Unsalted nuts and seeds. Unsalted canned beans. Dairy  Low-fat dairy products, such as skim or 1% milk, 2% or reduced-fat cheeses, low-fat ricotta or cottage cheese, or plain low-fat yogurt. Low-sodium or reduced-sodium cheeses. Fats and Oils  Tub margarines without trans fats. Light or reduced-fat mayonnaise and salad dressings (reduced sodium). Avocado. Safflower, olive, or canola oils. Natural peanut or almond butter. Other  Unsalted popcorn and pretzels. The items listed above may not be a complete list of recommended foods or beverages. Contact your dietitian for more options.  What foods are not recommended? Grains  White bread. White pasta. White rice. Refined cornbread. Bagels and croissants. Crackers that contain trans fat. Vegetables  Creamed or fried vegetables. Vegetables in a cheese sauce. Regular canned vegetables. Regular canned tomato sauce and paste. Regular tomato and vegetable juices. Fruits  Canned fruit in light or heavy syrup. Fruit juice. Meat and Other Protein Products  Fatty cuts of meat. Ribs, chicken wings, bacon, sausage, bologna, salami, chitterlings, fatback, hot dogs, bratwurst, and packaged luncheon meats. Salted nuts and seeds. Canned beans with salt. Dairy  Whole or 2% milk,  cream, half-and-half, and cream cheese. Whole-fat or sweetened yogurt. Full-fat cheeses or blue cheese. Nondairy creamers and whipped toppings. Processed cheese, cheese spreads, or cheese curds. Condiments  Onion and garlic salt, seasoned salt, table salt, and sea salt. Canned and packaged gravies. Worcestershire sauce. Tartar sauce. Barbecue sauce. Teriyaki sauce. Soy sauce, including reduced sodium. Steak sauce. Fish sauce. Oyster sauce. Cocktail sauce. Horseradish. Ketchup and mustard. Meat flavorings and tenderizers. Bouillon cubes. Hot sauce. Tabasco sauce. Marinades. Taco seasonings. Relishes. Fats and Oils  Butter, stick margarine, lard,  shortening, ghee, and bacon fat. Coconut, palm kernel, or palm oils. Regular salad dressings. Other  Pickles and olives. Salted popcorn and pretzels. The items listed above may not be a complete list of foods and beverages to avoid. Contact your dietitian for more information.  Where can I find more information? National Heart, Lung, and Blood Institute: travelstabloid.com This information is not intended to replace advice given to you by your health care provider. Make sure you discuss any questions you have with your health care provider. Document Released: 10/30/2011 Document Revised: 04/17/2016 Document Reviewed: 09/14/2013 Elsevier Interactive Patient Education  2017 Else[employer] Inc. Hypertension Hypertension, commonly called high blood pressure, is when the force of blood pumping through your arteries is too strong. Your arteries are the blood vessels that carry blood from your heart throughout your body. A blood pressure reading consists of a higher number over a lower number, such as 110/72. The higher number (systolic) is the pressure inside your arteries when your heart pumps. The lower number (diastolic) is the pressure inside your arteries when your heart relaxes. Ideally you want your blood pressure below 120/80. Hypertension forces your heart to work harder to pump blood. Your arteries may become narrow or stiff. Having untreated or uncontrolled hypertension can cause heart attack, stroke, kidney disease, and other problems. What increases the risk? Some risk factors for high blood pressure are controllable. Others are not. Risk factors you cannot control include:  Race. You may be at higher risk if you are African American.  Age. Risk increases with age.  Gender. Men are at higher risk than women before age 76 years. After age 4, women are at higher risk than men. Risk factors you can control include:  Not getting enough exercise or physical  activity.  Being overweight.  Getting too much fat, sugar, calories, or salt in your diet.  Drinking too much alcohol. What are the signs or symptoms? Hypertension does not usually cause signs or symptoms. Extremely high blood pressure (hypertensive crisis) may cause headache, anxiety, shortness of breath, and nosebleed. How is this diagnosed? To check if you have hypertension, your health care provider will measure your blood pressure while you are seated, with your arm held at the level of your heart. It should be measured at least twice using the same arm. Certain conditions can cause a difference in blood pressure between your right and left arms. A blood pressure reading that is higher than normal on one occasion does not mean that you need treatment. If it is not clear whether you have high blood pressure, you may be asked to return on a different day to have your blood pressure checked again. Or, you may be asked to monitor your blood pressure at home for 1 or more weeks. How is this treated? Treating high blood pressure includes making lifestyle changes and possibly taking medicine. Living a healthy lifestyle can help lower high blood pressure. You may need to change some of your  habits. Lifestyle changes may include:  Following the DASH diet. This diet is high in fruits, vegetables, and whole grains. It is low in salt, red meat, and added sugars.  Keep your sodium intake below 2,300 mg per day.  Getting at least 30-45 minutes of aerobic exercise at least 4 times per week.  Losing weight if necessary.  Not smoking.  Limiting alcoholic beverages.  Learning ways to reduce stress. Your health care provider may prescribe medicine if lifestyle changes are not enough to get your blood pressure under control, and if one of the following is true:  You are 37-88 years of age and your systolic blood pressure is above 140.  You are 24 years of age or older, and your systolic blood  pressure is above 150.  Your diastolic blood pressure is above 90.  You have diabetes, and your systolic blood pressure is over [id number] or your diastolic blood pressure is over 90.  You have kidney disease and your blood pressure is above 140/90.  You have heart disease and your blood pressure is above 140/90. Your personal target blood pressure may vary depending on your medical conditions, your age, and other factors. Follow these instructions at home:  Have your blood pressure rechecked as directed by your health care provider.  Take medicines only as directed by your health care provider. Follow the directions carefully. Blood pressure medicines must be taken as prescribed. The medicine does not work as well when you skip doses. Skipping doses also puts you at risk for problems.  Do not smoke.  Monitor your blood pressure at home as directed by your health care provider. Contact a health care provider if:  You think you are having a reaction to medicines taken.  You have recurrent headaches or feel dizzy.  You have swelling in your ankles.  You have trouble with your vision. Get help right away if:  You develop a severe headache or confusion.  You have unusual weakness, numbness, or feel faint.  You have severe chest or abdominal pain.  You vomit repeatedly.  You have trouble breathing. This information is not intended to replace advice given to you by your health care provider. Make sure you discuss any questions you have with your health care provider. Document Released: 11/10/2005 Document Revised: 04/17/2016 Document Reviewed: 09/02/2013 Elsevier Interactive Patient Education  2017 [employer].

## 2016-11-20 NOTE — Progress Notes (Signed)
Tracie Estrada, is a 50 y.o. female  [id number]  [id number]  DOB - 05-04-1966  Chief Complaint  Patient presents with  . Hypertension       Subjective:   Tracie Estrada is a 50 y.o. female with history of uncontrolled hypertension who has been lost to follow up for over 3 years, recently reestablished care here and was restarted on BP medications,  here today for a follow up visit. Since starting lisinopri-hydrochlorothiazide, she has been coughing incessantly and to the point of stress incontinence while coughing. Cough is dry. No wheezing, no lumpy throat, no rash, no itching, no swelling of any part of the body. She coughs all the time including at night. She has not been very complaint as a result yet BP remained uncontrolled and HR is high sometimes with coughing. She denies any major symptom, no headache, no chest pain, no weakness. She has no fever, no SOB. She continues to smoke heavily but trying to cut down. No leg swelling, no PND, no orthopnea.   Problem  Cough    ALLERGIES: Allergies  Allergen Reactions  . Fish Allergy Anaphylaxis  . Peanuts [Peanut Oil] Anaphylaxis  . Other Other (See Comments)    Anesthesia.  Specific agent unknown.  Reaction unknown.   . Vancomycin Other (See Comments)    Nephrotoxicity    PAST MEDICAL HISTORY: Past Medical History:  Diagnosis Date  . Acute renal failure ([place]) 10/13/2013   On Vancomycin  . Breast abscess of female    Status post biopsy  . Hypertension   . 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 Authorizing Provider  amLODipine (NORVASC) 10 MG tablet TAKE 1 TABLET (10 MG TOTAL) BY MOUTH DAILY. 09/04/16  Yes Dionne Bucy McClung, PA-C  hydrALAZINE (APRESOLINE) 50 MG tablet Take 1 tablet (50 mg total) by mouth every 8 (eight) hours. 09/04/16  Yes Dionne Bucy McClung, PA-C  potassium chloride (K-DUR) 10 MEQ tablet Take 1 tablet (10 mEq  total) by mouth daily. 08/25/16  Yes Orlie Dakin, MD  aspirin EC 81 MG tablet Take 1 tablet (81 mg total) by mouth daily. 11/20/16   Tresa Garter, MD  guaiFENesin-dextromethorphan (ROBITUSSIN DM) 100-10 MG/5ML syrup Take 5 mLs by mouth every 4 (four) hours as needed for cough. 11/20/16   Tresa Garter, MD  hydrochlorothiazide (HYDRODIURIL) 25 MG tablet Take 1 tablet (25 mg total) by mouth daily. 11/20/16   Tresa Garter, MD  metoprolol tartrate (LOPRESSOR) 25 MG tablet Take 1 tablet (25 mg total) by mouth 2 (two) times daily. 11/20/16   Tresa Garter, MD    Objective:   Vitals:   11/20/16 1507  BP: (!) 190/110  Pulse: 100  Resp: 18  Temp: 98.4 F (36.9 C)  TempSrc: Oral  SpO2: 99%  Weight: 180 lb (81.6 kg)  Height: 4\' 11"  (1.499 m)   Exam General appearance : Awake, alert, not in any distress. Speech Clear. Not toxic looking, obese HEENT: Atraumatic and Normocephalic, pupils equally reactive to light and accomodation Neck: Supple, no JVD. No cervical lymphadenopathy.  Chest: Good air entry bilaterally, no added sounds  CVS: S1 S2 regular, no murmurs.  Abdomen: Bowel sounds present, Non tender and not distended with no gaurding, rigidity or rebound. Extremities: B/L Lower Ext shows no edema, both legs are warm to touch Neurology: Awake alert, and oriented X 3, CN II-XII intact, Non focal Skin: No  Rash  Data Review Lab Results  Component Value Date   HGBA1C (H) 07/07/2010    5.8 (NOTE)                                                                       According to the ADA Clinical Practice Recommendations for 2011, when HbA1c is used as a screening test:   >=6.5%   Diagnostic of Diabetes Mellitus           (if abnormal result  is confirmed)  5.7-6.4%   Increased risk of developing Diabetes Mellitus  References:Diagnosis and Classification of Diabetes Mellitus,Diabetes [id number] 1):S62-S69 and Standards of Medical Care in         Diabetes -  2011,Diabetes Care,2011,34  (Suppl 1):S11-S61.    Assessment & Plan   1. Accelerated hypertension  - Discontinue Lisinopril-Hydrochlorothiazide - cloNIDine (CATAPRES) tablet 0.2 mg; Take 1 tablet (0.2 mg total) by mouth once for accelerated HTN  Start - metoprolol tartrate (LOPRESSOR) 25 MG tablet; Take 1 tablet (25 mg total) by mouth 2 (two) times daily.  Dispense: 180 tablet; Refill: 3 Start - hydrochlorothiazide (HYDRODIURIL) 25 MG tablet; Take 1 tablet (25 mg total) by mouth daily.  Dispense: 90 tablet; Refill: 3 - aspirin EC 81 MG tablet; Take 1 tablet (81 mg total) by mouth daily.  Dispense: 90 tablet; Refill: 3 - Continue Amlodipine and Hydralazine  - COMPLETE METABOLIC PANEL WITH GFR - ECHOCARDIOGRAM COMPLETE; Future  2. Tobacco use disorder  Elizadeth was counseled on the dangers of tobacco use, and was advised to quit. Reviewed strategies to maximize success, including removing cigarettes and smoking materials from environment, stress management and support of family/friends.  3. Cough: Possibly Due to lisinopril side effect  - guaiFENesin-dextromethorphan (ROBITUSSIN DM) 100-10 MG/5ML syrup; Take 5 mLs by mouth every 4 (four) hours as needed for cough.  Dispense: 480 mL; Refill: 0  Patient have been counseled extensively about nutrition and exercise. Other issues discussed during this visit include: low cholesterol diet, weight control and daily exercise, importance of adherence with medications and regular follow-up. We also discussed long term complications of uncontrolled hypertension.   Return in about 4 weeks (around 12/18/2016) for Follow up HTN.  The patient was given clear instructions to go to ER or return to medical center if symptoms don't improve, worsen or new problems develop. The patient verbalized understanding. The patient was told to call to get lab results if they haven't heard anything in the next week.   This note has been created with Biomedical engineer. Any transcriptional errors are unintentional.    Angelica Chessman, MD, [place], [place], [place], [place] and [place], [place]   11/20/2016, 4:55 PM

## 2016-11-21 ENCOUNTER — Telehealth: Payer: Self-pay | Admitting: *Deleted

## 2016-11-21 LAB — COMPLETE METABOLIC PANEL WITH GFR
ALT: 8 U/L (ref 6–29)
AST: 12 U/L (ref 10–35)
Albumin: 3.8 g/dL (ref 3.6–5.1)
Alkaline Phosphatase: 48 U/L (ref 33–130)
BUN: 15 mg/dL (ref 7–25)
CHLORIDE: 100 mmol/L (ref 98–110)
CO2: 29 mmol/L (ref 20–31)
Calcium: 8.5 mg/dL — ABNORMAL LOW (ref 8.6–10.4)
Creat: 0.99 mg/dL (ref 0.50–1.05)
GFR, Est African American: 76 mL/min (ref >=60)
GFR, Est Non African American: 66 mL/min (ref >=60)
GLUCOSE: 83 mg/dL (ref 65–99)
POTASSIUM: 3.8 mmol/L (ref 3.5–5.3)
SODIUM: 136 mmol/L (ref 135–146)
Total Bilirubin: 0.2 mg/dL (ref 0.2–1.2)
Total Protein: 6.7 g/dL (ref 6.1–8.1)

## 2016-11-21 NOTE — Telephone Encounter (Signed)
-----   Message from Tresa Garter, MD sent at 11/21/2016 10:30 AM EST ----- Please inform patient that her lab results are back to normal

## 2016-11-21 NOTE — Telephone Encounter (Signed)
MA informed patient of lab results returning back to normal. Patient is provided the [facility] telephone number for any questions or concerns.

## 2016-11-22 ENCOUNTER — Encounter (HOSPITAL_COMMUNITY): Payer: Self-pay

## 2016-11-22 ENCOUNTER — Other Ambulatory Visit: Payer: Self-pay

## 2016-11-22 ENCOUNTER — Emergency Department (HOSPITAL_COMMUNITY)
Admission: EM | Admit: 2016-11-22 | Discharge: 2016-11-23 | Disposition: A | Discharge status: Home / Self Care | Payer: Self-pay | Attending: Emergency Medicine | Admitting: Emergency Medicine

## 2016-11-22 DIAGNOSIS — F17210 Nicotine dependence, cigarettes, uncomplicated: Secondary | ICD-10-CM | POA: Insufficient documentation

## 2016-11-22 DIAGNOSIS — I130 Hypertensive heart and chronic kidney disease with heart failure and stage 1 through stage 4 chronic kidney disease, or unspecified chronic kidney disease: Secondary | ICD-10-CM | POA: Insufficient documentation

## 2016-11-22 DIAGNOSIS — Z79899 Other long term (current) drug therapy: Secondary | ICD-10-CM | POA: Insufficient documentation

## 2016-11-22 DIAGNOSIS — Z91010 Allergy to peanuts: Secondary | ICD-10-CM | POA: Insufficient documentation

## 2016-11-22 DIAGNOSIS — I509 Heart failure, unspecified: Secondary | ICD-10-CM | POA: Insufficient documentation

## 2016-11-22 DIAGNOSIS — Z7982 Long term (current) use of aspirin: Secondary | ICD-10-CM | POA: Insufficient documentation

## 2016-11-22 DIAGNOSIS — R0789 Other chest pain: Secondary | ICD-10-CM | POA: Insufficient documentation

## 2016-11-22 DIAGNOSIS — N189 Chronic kidney disease, unspecified: Secondary | ICD-10-CM | POA: Insufficient documentation

## 2016-11-22 NOTE — ED Provider Notes (Signed)
[facility] DEPT Provider Note   CSN: [id number] Arrival date & time: 11/22/16  2251   By signing my name below, I, Delton Prairie, attest that this documentation has been prepared under the direction and in the presence of Varney Biles, MD  Electronically Signed: Delton Prairie, ED Scribe. 11/22/16. 11:57 PM.   History   Chief Complaint Chief Complaint  Patient presents with  . Chest Pain   The history is provided by the patient. No language interpreter was used.   HPI Comments:  Tracie Estrada is a 49 y.o. female, with a hx of HTN and hyperlipidemia, who presents to the Emergency Department complaining of sudden onset, 2 episodes of chest pain, which lasted for a few seconds, with associated elevated blood pressure which began around 3 PM today. She has not experienced any pain since 10 PM. She was given 323 ASA by EMS with little relief. Pt denies radiation of the pain, hx of similar pain, hx of CHF, alcohol use, any other associated symptoms and any other modifying factors at this time. She is a smoker (2 cigarettes today) and notes she used be a heavy smoker in the past. She is on BP medication (4 a day) which she takes at night and notes she was taken off of lisinopril and started on metoprolol.   Past Medical History:  Diagnosis Date  . Acute renal failure ([place]) 10/13/2013   On Vancomycin  . Breast abscess of female    Status post biopsy  . Hypertension   . Malignant hypertension 10/13/2013    Patient Active Problem List   Diagnosis Date Noted  . Cough 11/20/2016  . Accelerated hypertension 10/25/2013  . Chronic kidney disease 10/25/2013  . Breast abscess of female 10/25/2013  . Malignant hypertension 10/13/2013  . Acute renal failure ([place]) 10/13/2013  . Hypokalemia 10/13/2013  . Left breast abscess 10/10/2013  . Mastitis 10/10/2013  . Discharge of breast 09/27/2013  . Breast mass, left 09/27/2013  . HYPERLIPIDEMIA 08/06/2010  . ANXIETY 08/06/2010  . SMOKER  08/06/2010  . Essential hypertension, benign 08/06/2010    Past Surgical History:  Procedure Laterality Date  . Biopsy of left breast    . INCISION AND DRAINAGE ABSCESS Left 10/11/2013   Procedure: INCISION AND DRAINAGE and debridement LEFT BREAST ABSCESS;  Surgeon: Odis Hollingshead, MD;  Location: [facility] ORS;  Service: General;  Laterality: Left;    OB History    Gravida Para Term Preterm AB Living   1 1 1     1    SAB TAB Ectopic Multiple Live Births                   Home Medications    Prior to Admission medications   Medication Sig Start Date End Date Taking? Authorizing Provider  amLODipine (NORVASC) 10 MG tablet TAKE 1 TABLET (10 MG TOTAL) BY MOUTH DAILY. 09/04/16  Yes Argentina Donovan, PA-C  aspirin EC 81 MG tablet Take 1 tablet (81 mg total) by mouth daily. 11/20/16  Yes Tresa Garter, MD  guaiFENesin-dextromethorphan (ROBITUSSIN DM) 100-10 MG/5ML syrup Take 5 mLs by mouth every 4 (four) hours as needed for cough. 11/20/16  Yes Tresa Garter, MD  hydrALAZINE (APRESOLINE) 50 MG tablet Take 1 tablet (50 mg total) by mouth every 8 (eight) hours. 09/04/16  Yes Dionne Bucy McClung, PA-C  hydrochlorothiazide (HYDRODIURIL) 25 MG tablet Take 1 tablet (25 mg total) by mouth daily. 11/20/16  Yes Tresa Garter, MD  metoprolol tartrate (  LOPRESSOR) 25 MG tablet Take 1 tablet (25 mg total) by mouth 2 (two) times daily. 11/20/16  Yes Tresa Garter, MD  potassium chloride (K-DUR) 10 MEQ tablet Take 1 tablet (10 mEq total) by mouth daily. 08/25/16  Yes Orlie Dakin, MD    Family History Family History  Problem Relation Age of Onset  . Breast cancer Mother   . Cancer Mother     brain  . Hypertension Father   . Thyroid disease Daughter     Social History Social History  Substance Use Topics  . Smoking status: Current Every Day Smoker    Packs/day: 1.00    Years: 26.00    Types: Cigarettes  . Smokeless tobacco: Never Used  . Alcohol use No     Allergies     Fish allergy; Peanuts [peanut oil]; Other; and Vancomycin   Review of Systems Review of Systems  10 systems reviewed and all are negative for acute change except as noted in the HPI.   Physical Exam Updated Vital Signs BP 190/96   Pulse 70   Temp 98.6 F (37 C) (Oral)   Resp 17   Ht 4\' 11"  (1.499 m)   Wt 180 lb (81.6 kg)   SpO2 98%   BMI 36.36 kg/m   Physical Exam  Constitutional: She is oriented to person, place, and time. She appears well-developed and well-nourished. No distress.  HENT:  Head: Normocephalic and atraumatic.  Eyes: EOM are normal.  Neck: Normal range of motion. No JVD present.  Cardiovascular: Normal rate, regular rhythm and normal heart sounds.   Pulses:      Radial pulses are 2+ on the right side, and 2+ on the left side.  Pulmonary/Chest: Effort normal and breath sounds normal.  Lungs clear   Abdominal: Soft. She exhibits no distension. There is no tenderness.  Musculoskeletal: Normal range of motion.  Neurological: She is alert and oriented to person, place, and time.  Skin: Skin is warm and dry.  Psychiatric: She has a normal mood and affect. Judgment normal.  Nursing note and vitals reviewed.  ED Treatments / Results  DIAGNOSTIC STUDIES:  Oxygen Saturation is 100% on RA, normal by my interpretation.    COORDINATION OF CARE:  11:49 PM Discussed treatment plan with pt at bedside and pt agreed to plan.  Labs (all labs ordered are listed, but only abnormal results are displayed) Labs Reviewed  CBC - Abnormal; Notable for the following:       Result Value   WBC 13.5 (*)    All other components within normal limits  BASIC METABOLIC PANEL - Abnormal; Notable for the following:    Potassium 3.3 (*)    Glucose, Bld 109 (*)    All other components within normal limits  I-STAT TROPOININ, ED  I-STAT TROPOININ, ED    EKG  EKG Interpretation  Date/Time:  Saturday November 22 2016 22:51:09 EST Ventricular Rate:  78 PR Interval:  160 QRS  Duration: 94 QT Interval:  398 QTC Calculation: 453 R Axis:   13 Text Interpretation:  Normal sinus rhythm T wave abnormality, consider inferolateral ischemia Abnormal ECG No significant change since last tracing Confirmed by Winfred Leeds  MD, SAM [id number]) on 11/22/2016 10:55:43 PM       Radiology Dg Chest Portable 1 View  Result Date: 11/23/2016 CLINICAL DATA:  Intermittent central chest pain. History of hypertension. EXAM: PORTABLE CHEST 1 VIEW COMPARISON:  Chest radiograph May 20, 2015 FINDINGS: Cardiac silhouette is mildly enlarged, mediastinal silhouette  is unremarkable. No pleural effusion or focal consolidation. No pneumothorax. Soft tissue planes and included osseous structures are nonsuspicious. IMPRESSION: Stable cardiomegaly, no acute pulmonary process. Electronically Signed   By: Elon Alas M.D.   On: 11/23/2016 00:13    Procedures Procedures (including critical care time)  Medications Ordered in ED Medications - No data to display   Initial Impression / Assessment and Plan / ED Course  I have reviewed the triage vital signs and the nursing notes.  Pertinent labs & imaging results that were available during my care of the patient were reviewed by me and considered in my medical decision making (see chart for details).  Clinical Course as of Nov 24 926  Sun Nov 23, 2016  [id number] Patient reassessed. Pt is comfortable at this time.  Results of the workup discussed. Strict ER return precautions discussed. Follow up instruction discussed, and pt agrees with the plan and is comfortable with it.   [AN]    Clinical Course User Index [AN] Varney Biles, MD    Differential diagnosis includes: ACS syndrome Myocarditis Pericarditis Pericardial effusion Pneumonia Pleural effusion Pulmonary edema PE Musculoskeletal pain  Atypical chest pain  - we will get trops x 2. Pt's pain very atypical as she had 2 episodes of chest pain that lasted just for a few seconds. No  know CAD, but has cardiac risk factors.  Final Clinical Impressions(s) / ED Diagnoses   Final diagnoses:  Atypical chest pain    New Prescriptions Discharge Medication List as of 11/23/2016  3:22 AM    I personally performed the services described in this documentation, which was scribed in my presence. The recorded information has been reviewed and is accurate.    Varney Biles, MD 11/24/16 [phone]

## 2016-11-22 NOTE — ED Triage Notes (Signed)
Pt arrived via EMS c/o intermittent central chest pain.  EMS gave 324 ASA.

## 2016-11-22 NOTE — ED Notes (Signed)
Nanavati MD at bedside 

## 2016-11-23 ENCOUNTER — Emergency Department (HOSPITAL_COMMUNITY): Payer: Self-pay

## 2016-11-23 LAB — CBC
HEMATOCRIT: 38.2 % (ref 36.0–46.0)
HEMOGLOBIN: 13.3 g/dL (ref 12.0–15.0)
MCH: 29 pg (ref 26.0–34.0)
MCHC: 34.8 g/dL (ref 30.0–36.0)
MCV: 83.4 fL (ref 78.0–100.0)
Platelets: 349 10*3/uL (ref 150–400)
RBC: 4.58 MIL/uL (ref 3.87–5.11)
RDW: 15.4 % (ref 11.5–15.5)
WBC: 13.5 10*3/uL — AB (ref 4.0–10.5)

## 2016-11-23 LAB — BASIC METABOLIC PANEL
ANION GAP: 10 (ref 5–15)
BUN: 13 mg/dL (ref 6–20)
CHLORIDE: 101 mmol/L (ref 101–111)
CO2: 25 mmol/L (ref 22–32)
Calcium: 9.2 mg/dL (ref 8.9–10.3)
Creatinine, Ser: 0.86 mg/dL (ref 0.44–1.00)
Glucose, Bld: 109 mg/dL — ABNORMAL HIGH (ref 65–99)
POTASSIUM: 3.3 mmol/L — AB (ref 3.5–5.1)
SODIUM: 136 mmol/L (ref 135–145)

## 2016-11-23 LAB — I-STAT TROPONIN, ED
TROPONIN I, POC: 0.02 ng/mL (ref 0.00–0.08)
Troponin i, poc: 0.01 ng/mL (ref 0.00–0.08)

## 2016-11-23 NOTE — Discharge Instructions (Signed)

## 2016-11-23 NOTE — ED Notes (Signed)
Patient left at this time with all belongings. 

## 2017-01-15 MED FILL — HYDROCHLOROTHIAZIDE 25 MG T: 25 | 30 days supply | Qty: 30 | Fill #1

## 2017-01-15 MED FILL — hydrALAZINE HCL 50 MG TABS: 50 | 30 days supply | Qty: 90 | Fill #1

## 2017-01-15 MED FILL — METOPROLOL TARTRATE 25 MG T: 25 | 30 days supply | Qty: 60 | Fill #1

## 2017-01-15 MED FILL — AMLODIPINE BESYLATE 10 MG T: 10 | 30 days supply | Qty: 30 | Fill #2

## 2017-01-21 ENCOUNTER — Ambulatory Visit: Payer: Self-pay | Attending: Internal Medicine | Admitting: Internal Medicine

## 2017-01-28 ENCOUNTER — Ambulatory Visit: Payer: Self-pay | Admitting: Internal Medicine

## 2017-04-28 ENCOUNTER — Emergency Department (HOSPITAL_COMMUNITY)
Admission: EM | Admit: 2017-04-28 | Discharge: 2017-04-28 | Disposition: A | Discharge status: Home / Self Care | Payer: Self-pay | Attending: Emergency Medicine | Admitting: Emergency Medicine

## 2017-04-28 ENCOUNTER — Encounter (HOSPITAL_COMMUNITY): Payer: Self-pay | Admitting: Emergency Medicine

## 2017-04-28 ENCOUNTER — Emergency Department (HOSPITAL_COMMUNITY): Payer: Self-pay

## 2017-04-28 DIAGNOSIS — Z91010 Allergy to peanuts: Secondary | ICD-10-CM | POA: Insufficient documentation

## 2017-04-28 DIAGNOSIS — M545 Low back pain, unspecified: Secondary | ICD-10-CM

## 2017-04-28 DIAGNOSIS — I129 Hypertensive chronic kidney disease with stage 1 through stage 4 chronic kidney disease, or unspecified chronic kidney disease: Secondary | ICD-10-CM | POA: Insufficient documentation

## 2017-04-28 DIAGNOSIS — F17210 Nicotine dependence, cigarettes, uncomplicated: Secondary | ICD-10-CM | POA: Insufficient documentation

## 2017-04-28 DIAGNOSIS — Z79899 Other long term (current) drug therapy: Secondary | ICD-10-CM | POA: Insufficient documentation

## 2017-04-28 DIAGNOSIS — Z7982 Long term (current) use of aspirin: Secondary | ICD-10-CM | POA: Insufficient documentation

## 2017-04-28 DIAGNOSIS — R52 Pain, unspecified: Secondary | ICD-10-CM

## 2017-04-28 DIAGNOSIS — N189 Chronic kidney disease, unspecified: Secondary | ICD-10-CM | POA: Insufficient documentation

## 2017-04-28 DIAGNOSIS — I10 Essential (primary) hypertension: Secondary | ICD-10-CM

## 2017-04-28 LAB — BASIC METABOLIC PANEL
ANION GAP: 7 (ref 5–15)
BUN: 11 mg/dL (ref 6–20)
CO2: 27 mmol/L (ref 22–32)
Calcium: 8.6 mg/dL — ABNORMAL LOW (ref 8.9–10.3)
Chloride: 104 mmol/L (ref 101–111)
Creatinine, Ser: 0.78 mg/dL (ref 0.44–1.00)
GFR calc Af Amer: >60 mL/min (ref >60)
GLUCOSE: 101 mg/dL — AB (ref 65–99)
POTASSIUM: 3.4 mmol/L — AB (ref 3.5–5.1)
Sodium: 138 mmol/L (ref 135–145)

## 2017-04-28 MED ORDER — ACETAMINOPHEN 325 MG PO TABS
650.0000 mg | ORAL_TABLET | Freq: Once | ORAL | Status: AC
  Administered 2017-04-28: 650 mg via ORAL
  Filled 2017-04-28: qty 2

## 2017-04-28 MED ORDER — HYDRALAZINE HCL 50 MG PO TABS
50.0000 mg | ORAL_TABLET | Freq: Once | ORAL | Status: AC
  Administered 2017-04-28: 50 mg via ORAL
  Filled 2017-04-28: qty 1

## 2017-04-28 MED ORDER — METHOCARBAMOL 500 MG PO TABS
500.0000 mg | ORAL_TABLET | Freq: Once | ORAL | Status: AC
  Administered 2017-04-28: 500 mg via ORAL
  Filled 2017-04-28: qty 1

## 2017-04-28 MED ORDER — CLONIDINE HCL 0.1 MG PO TABS
0.1000 mg | ORAL_TABLET | Freq: Once | ORAL | Status: AC
  Administered 2017-04-28: 0.1 mg via ORAL
  Filled 2017-04-28: qty 1

## 2017-04-28 MED ORDER — AMLODIPINE BESYLATE 5 MG PO TABS
10.0000 mg | ORAL_TABLET | Freq: Once | ORAL | Status: AC
  Administered 2017-04-28: 10 mg via ORAL
  Filled 2017-04-28: qty 2

## 2017-04-28 MED ORDER — HYDROCHLOROTHIAZIDE 25 MG PO TABS
25.0000 mg | ORAL_TABLET | Freq: Every day | ORAL | Status: DC
  Administered 2017-04-28: 25 mg via ORAL
  Filled 2017-04-28: qty 1

## 2017-04-28 MED ORDER — METHOCARBAMOL 500 MG PO TABS: 500.0000 mg | ORAL_TABLET | Freq: Two times a day (BID) | ORAL | 0 refills | Status: DC

## 2017-04-28 MED ORDER — METOPROLOL TARTRATE 25 MG PO TABS
25.0000 mg | ORAL_TABLET | Freq: Once | ORAL | Status: AC
  Administered 2017-04-28: 25 mg via ORAL
  Filled 2017-04-28: qty 1

## 2017-04-28 MED ORDER — DIAZEPAM 5 MG PO TABS: 5.0000 mg | ORAL_TABLET | Freq: Once | ORAL | Status: DC

## 2017-04-28 NOTE — ED Notes (Signed)
Pt ambulatory to bathroom

## 2017-04-28 NOTE — ED Triage Notes (Signed)
Patient complaining about right leg from her buttocks going down like a bunch of needles sticking her. Patient states it started three days ago.

## 2017-04-28 NOTE — ED Provider Notes (Signed)
[facility] DEPT Provider Note   CSN: [id number] Arrival date & time: 04/28/17  1659     History   Chief Complaint Chief Complaint  Patient presents with  . Leg Pain  . Hypertension    HPI Tracie Estrada is a 51 y.o. female.  51 year old female presents with right hip pain 3 days characterized as sharp and worse with standing. Denies any history of trauma. No bowel or bladder dysfunction. Pain does radiate to her mid thigh. Denies any foot drop. No prior history of same.  Patient also missed a history of hypertension and states she's been noncompliant with antihypertensive medications. Denies any chest pain or shortness of breath. No severe headaches. Hasn't taken any of her hypertensive medications today.      Past Medical History:  Diagnosis Date  . Acute renal failure ([place]) 10/13/2013   On Vancomycin  . Breast abscess of female    Status post biopsy  . Hypertension   . Malignant hypertension 10/13/2013    Patient Active Problem List   Diagnosis Date Noted  . Cough 11/20/2016  . Accelerated hypertension 10/25/2013  . Chronic kidney disease 10/25/2013  . Breast abscess of female 10/25/2013  . Malignant hypertension 10/13/2013  . Acute renal failure ([place]) 10/13/2013  . Hypokalemia 10/13/2013  . Left breast abscess 10/10/2013  . Mastitis 10/10/2013  . Discharge of breast 09/27/2013  . Breast mass, left 09/27/2013  . HYPERLIPIDEMIA 08/06/2010  . ANXIETY 08/06/2010  . SMOKER 08/06/2010  . Essential hypertension, benign 08/06/2010    Past Surgical History:  Procedure Laterality Date  . Biopsy of left breast    . INCISION AND DRAINAGE ABSCESS Left 10/11/2013   Procedure: INCISION AND DRAINAGE and debridement LEFT BREAST ABSCESS;  Surgeon: Odis Hollingshead, MD;  Location: [facility] ORS;  Service: General;  Laterality: Left;    OB History    Gravida Para Term Preterm AB Living   1 1 1     1    SAB TAB Ectopic Multiple Live Births                   Home  Medications    Prior to Admission medications   Medication Sig Start Date End Date Taking? Authorizing Provider  amLODipine (NORVASC) 10 MG tablet TAKE 1 TABLET (10 MG TOTAL) BY MOUTH DAILY. 09/04/16  Yes Argentina Donovan, PA-C  aspirin EC 81 MG tablet Take 1 tablet (81 mg total) by mouth daily. 11/20/16  Yes Jegede, Marlena Clipper, MD  guaiFENesin-dextromethorphan (ROBITUSSIN DM) 100-10 MG/5ML syrup Take 5 mLs by mouth every 4 (four) hours as needed for cough. 11/20/16  Yes Tresa Garter, MD  hydrALAZINE (APRESOLINE) 50 MG tablet Take 1 tablet (50 mg total) by mouth every 8 (eight) hours. 09/04/16  Yes McClung, Angela M, PA-C  hydrochlorothiazide (HYDRODIURIL) 25 MG tablet Take 1 tablet (25 mg total) by mouth daily. 11/20/16  Yes Tresa Garter, MD  metoprolol tartrate (LOPRESSOR) 25 MG tablet Take 1 tablet (25 mg total) by mouth 2 (two) times daily. 11/20/16  Yes Tresa Garter, MD  potassium chloride (K-DUR) 10 MEQ tablet Take 1 tablet (10 mEq total) by mouth daily. 08/25/16  Yes Orlie Dakin, MD    Family History Family History  Problem Relation Age of Onset  . Breast cancer Mother   . Cancer Mother        brain  . Hypertension Father   . Thyroid disease Daughter     Social History Social History  Substance Use Topics  . Smoking status: Current Every Day Smoker    Packs/day: 1.00    Years: 26.00    Types: Cigarettes  . Smokeless tobacco: Never Used  . Alcohol use No     Allergies   Fish allergy; Peanuts [peanut oil]; Other; and Vancomycin   Review of Systems Review of Systems  All other systems reviewed and are negative.    Physical Exam Updated Vital Signs BP (!) 221/113   Pulse 76   Temp 98.3 F (36.8 C) (Oral)   Resp 16   Ht 1.499 m (4\' 11" )   Wt 86.5 kg (190 lb 9.6 oz)   LMP 04/26/2017   SpO2 99%   BMI 38.50 kg/m   Physical Exam  Constitutional: She is oriented to person, place, and time. She appears well-developed and  well-nourished.  Non-toxic appearance. No distress.  HENT:  Head: Normocephalic and atraumatic.  Eyes: Conjunctivae, EOM and lids are normal. Pupils are equal, round, and reactive to light.  Neck: Normal range of motion. Neck supple. No tracheal deviation present. No thyroid mass present.  Cardiovascular: Normal rate, regular rhythm and normal heart sounds.  Exam reveals no gallop.   No murmur heard. Pulmonary/Chest: Effort normal and breath sounds normal. No stridor. No respiratory distress. She has no decreased breath sounds. She has no wheezes. She has no rhonchi. She has no rales.  Abdominal: Soft. Normal appearance and bowel sounds are normal. She exhibits no distension. There is no tenderness. There is no rebound and no CVA tenderness.  Musculoskeletal: Normal range of motion. She exhibits no edema or tenderness.       Legs: Right hip with normal range of motion. No pain at sciatic notch. Neurovascular intact at right foot. Patellar reflexes 2+  Neurological: She is alert and oriented to person, place, and time. She has normal strength. No cranial nerve deficit or sensory deficit. GCS eye subscore is 4. GCS verbal subscore is 5. GCS motor subscore is 6.  Skin: Skin is warm and dry. No abrasion and no rash noted.  Psychiatric: She has a normal mood and affect. Her speech is normal and behavior is normal.  Nursing note and vitals reviewed.    ED Treatments / Results  Labs (all labs ordered are listed, but only abnormal results are displayed) Labs Reviewed  BASIC METABOLIC PANEL    EKG  EKG Interpretation None       Radiology No results found.  Procedures Procedures (including critical care time)  Medications Ordered in ED Medications  hydrALAZINE (APRESOLINE) tablet 50 mg (not administered)  metoprolol tartrate (LOPRESSOR) tablet 25 mg (not administered)  acetaminophen (TYLENOL) tablet 650 mg (not administered)  methocarbamol (ROBAXIN) tablet 500 mg (not administered)       Initial Impression / Assessment and Plan / ED Course  I have reviewed the triage vital signs and the nursing notes.  Pertinent labs & imaging results that were available during my care of the patient were reviewed by me and considered in my medical decision making (see chart for details).     Patient's blood pressure treated here. Blood pressure improved after multiple doses of medication. Leg pain treated she feels better. Suspect muscular skeletal etiology and was discharged home  Final Clinical Impressions(s) / ED Diagnoses   Final diagnoses:  Pain    New Prescriptions New Prescriptions   No medications on file     Lacretia Leigh, MD 04/28/17 2246

## 2017-07-29 ENCOUNTER — Ambulatory Visit: Payer: Self-pay | Attending: Internal Medicine | Admitting: Internal Medicine

## 2017-07-29 ENCOUNTER — Encounter: Payer: Self-pay | Admitting: Internal Medicine

## 2017-07-29 VITALS — BP 223/135 | HR 84 | Temp 97.9°F | Resp 20 | Wt 190.0 lb

## 2017-07-29 DIAGNOSIS — F17210 Nicotine dependence, cigarettes, uncomplicated: Secondary | ICD-10-CM | POA: Insufficient documentation

## 2017-07-29 DIAGNOSIS — Z881 Allergy status to other antibiotic agents status: Secondary | ICD-10-CM | POA: Insufficient documentation

## 2017-07-29 DIAGNOSIS — F17200 Nicotine dependence, unspecified, uncomplicated: Secondary | ICD-10-CM

## 2017-07-29 DIAGNOSIS — I10 Essential (primary) hypertension: Secondary | ICD-10-CM | POA: Insufficient documentation

## 2017-07-29 DIAGNOSIS — Z7982 Long term (current) use of aspirin: Secondary | ICD-10-CM | POA: Insufficient documentation

## 2017-07-29 MED ORDER — METOPROLOL TARTRATE 50 MG PO TABS: 50.0000 mg | ORAL_TABLET | Freq: Two times a day (BID) | ORAL | 3 refills | Status: AC

## 2017-07-29 MED ORDER — AMLODIPINE BESYLATE 10 MG PO TABS: ORAL_TABLET | ORAL | 5 refills | Status: AC

## 2017-07-29 MED ORDER — HYDRALAZINE HCL 50 MG PO TABS
50.0000 mg | ORAL_TABLET | Freq: Three times a day (TID) | ORAL | 5 refills | Status: DC
Start: 2017-07-29 — End: 2017-08-11

## 2017-07-29 MED ORDER — TRIAMCINOLONE ACETONIDE 0.5 % EX OINT: 1.0000 "application " | TOPICAL_OINTMENT | Freq: Two times a day (BID) | CUTANEOUS | 1 refills | Status: DC

## 2017-07-29 MED ORDER — HYDROCHLOROTHIAZIDE 25 MG PO TABS: 25.0000 mg | ORAL_TABLET | Freq: Every day | ORAL | 3 refills | Status: DC

## 2017-07-29 MED ORDER — POTASSIUM CHLORIDE ER 10 MEQ PO TBCR: 10.0000 meq | EXTENDED_RELEASE_TABLET | Freq: Every day | ORAL | 3 refills | Status: AC

## 2017-07-29 MED ORDER — POTASSIUM CHLORIDE ER 10 MEQ PO TBCR: 10.0000 meq | EXTENDED_RELEASE_TABLET | Freq: Every day | ORAL | 3 refills | Status: DC

## 2017-07-29 MED FILL — TRIAMCINOLONE 0.5% OINTMENT: 0.5 | 15 days supply | Qty: 30 | Fill #0

## 2017-07-29 MED FILL — METOPROLOL TARTRATE 50 MG T: 50 | 30 days supply | Qty: 60 | Fill #0

## 2017-07-29 MED FILL — HYDROCHLOROTHIAZIDE 25 MG T: 25 | 30 days supply | Qty: 30 | Fill #0

## 2017-07-29 MED FILL — AMLODIPINE BESYLATE 10 MG T: 10 | 30 days supply | Qty: 30 | Fill #0

## 2017-07-29 MED FILL — hydrALAZINE HCL 50 MG TABS: 50 | 30 days supply | Qty: 90 | Fill #0

## 2017-07-29 MED FILL — POTASSIUM CL 10 MEQ TAB SA: 10 | 30 days supply | Qty: 30 | Fill #0

## 2017-07-29 NOTE — Progress Notes (Signed)
F/u HTN  Has not taken medication this morning

## 2017-07-29 NOTE — Progress Notes (Signed)
Tracie Estrada, is a 51 y.o. female  TDD:[id number]  WCB:[id number]  DOB - July 08, 1966     Subjective:   Tracie Estrada is a 51 y.o. female with history of uncontrolled hypertension who has been lost to follow up for over 8 months, extremely non-adherent with medications and lifestyle modification presents here today for a follow up visit and medication refill. Patient is on 4 different medications for hypertension but she hardly takes 2 daily because she said the medications makes her weak. She occasionally take 1 in the morning and sometimes the other one in the evening. Blood pressure remains uncontrolled. When presented to the clinic today and noticed to have very high blood pressure, she jokingly said "this is even better than what I used to have at home". Patient has been counseled and educated severally about hypertension and the dangers of uncontrolled blood pressure. Patient continues to smoke heavily claiming that cigarette calms her down from a lot of stress from her daughter and grandchildren who live with her. She really does not have any major complaints today, she denies any headache or blurry vision. She denies any chest pain. She denies any urinary symptoms. She has no dyspnea or orthopnea. No abdominal pain - No Nausea, No new weakness tingling or numbness, No Cough.  No problems updated.  ALLERGIES: Allergies  Allergen Reactions  . Fish Allergy Anaphylaxis  . Peanuts [Peanut Oil] Anaphylaxis  . Other Other (See Comments)    Anesthesia.  Specific agent unknown.  Reaction unknown.   . Vancomycin Other (See Comments)    Nephrotoxicity    PAST MEDICAL HISTORY: Past Medical History:  Diagnosis Date  . Acute renal failure ([place]) 10/13/2013   On Vancomycin  . Breast abscess of female    Status post biopsy  . Hypertension   . 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 Authorizing  Provider  amLODipine (NORVASC) 10 MG tablet TAKE 1 TABLET (10 MG TOTAL) BY MOUTH DAILY. 07/29/17  Yes Tresa Garter, MD  aspirin EC 81 MG tablet Take 1 tablet (81 mg total) by mouth daily. 11/20/16  Yes Tresa Garter, MD  hydrALAZINE (APRESOLINE) 50 MG tablet Take 1 tablet (50 mg total) by mouth every 8 (eight) hours. 07/29/17  Yes Tresa Garter, MD  hydrochlorothiazide (HYDRODIURIL) 25 MG tablet Take 1 tablet (25 mg total) by mouth daily. 07/29/17  Yes Tresa Garter, MD  metoprolol tartrate (LOPRESSOR) 50 MG tablet Take 1 tablet (50 mg total) by mouth 2 (two) times daily. 07/29/17  Yes Elton Heid, Marlena Clipper, MD  guaiFENesin-dextromethorphan (ROBITUSSIN DM) 100-10 MG/5ML syrup Take 5 mLs by mouth every 4 (four) hours as needed for cough. Patient not taking: Reported on 07/29/2017 11/20/16   Tresa Garter, MD  methocarbamol (ROBAXIN) 500 MG tablet Take 1 tablet (500 mg total) by mouth 2 (two) times daily. Patient not taking: Reported on 07/29/2017 04/28/17   Lacretia Leigh, MD  potassium chloride (K-DUR) 10 MEQ tablet Take 1 tablet (10 mEq total) by mouth daily. 07/29/17   Tresa Garter, MD    Objective:   Vitals:   07/29/17 1143  BP: (!) 223/135  Pulse: 84  Resp: 20  Temp: 97.9 F (36.6 C)  TempSrc: Oral  SpO2: 99%  Weight: 190 lb (86.2 kg)   Exam General appearance : Awake, alert, not in any distress. Speech Clear. Not toxic looking HEENT: Atraumatic and Normocephalic, pupils  equally reactive to light and accomodation Neck: Supple, no JVD. No cervical lymphadenopathy.  Chest: Good air entry bilaterally, no added sounds  CVS: S1 S2 regular, no murmurs.  Abdomen: Bowel sounds present, Non tender and not distended with no gaurding, rigidity or rebound. Extremities: B/L Lower Ext shows no edema, both legs are warm to touch Neurology: Awake alert, and oriented X 3, CN II-XII intact, Non focal Skin: No Rash  Data Review Lab Results  Component Value Date    HGBA1C (H) 07/07/2010    5.8 (NOTE)                                                                       According to the ADA Clinical Practice Recommendations for 2011, when HbA1c is used as a screening test:   >=6.5%   Diagnostic of Diabetes Mellitus           (if abnormal result  is confirmed)  5.7-6.4%   Increased risk of developing Diabetes Mellitus  References:Diagnosis and Classification of Diabetes Mellitus,Diabetes ZOXW,9604,54(UJWJX 1):S62-S69 and Standards of Medical Care in         Diabetes - 2011,Diabetes BJYN,8295,62  (Suppl 1):S11-S61.    Assessment & Plan   1. Malignant hypertension  - amLODipine (NORVASC) 10 MG tablet; TAKE 1 TABLET (10 MG TOTAL) BY MOUTH DAILY.  Dispense: 30 tablet; Refill: 5 - hydrALAZINE (APRESOLINE) 50 MG tablet; Take 1 tablet (50 mg total) by mouth every 8 (eight) hours.  Dispense: 90 tablet; Refill: 5 - hydrochlorothiazide (HYDRODIURIL) 25 MG tablet; Take 1 tablet (25 mg total) by mouth daily.  Dispense: 90 tablet; Refill: 3 - metoprolol tartrate (LOPRESSOR) 50 MG tablet; Take 1 tablet (50 mg total) by mouth 2 (two) times daily.  Dispense: 180 tablet; Refill: 3  - CMP14+EGFR - Lipid panel - Urinalysis, Complete - potassium chloride (K-DUR) 10 MEQ tablet; Take 1 tablet (10 mEq total) by mouth daily.  Dispense: 30 tablet; Refill: 3  2. Tobacco use disorder  Audreana was counseled on the dangers of tobacco use, and was advised to quit. Reviewed strategies to maximize success, including removing cigarettes and smoking materials from environment, stress management and support of family/friends.  Patient have been counseled extensively about nutrition and exercise. Other issues discussed during this visit include: low cholesterol diet, weight control and daily exercise  Return in about 2 days (around 07/31/2017) for BP Check, Nurse Visit.  The patient was given clear instructions to go to ER or return to medical center if symptoms don't improve, worsen or  new problems develop. The patient verbalized understanding. The patient was told to call to get lab results if they haven't heard anything in the next week.   This note has been created with Surveyor, quantity. Any transcriptional errors are unintentional.    Angelica Chessman, MD, [place], [place], [place], [place] and [place], [place]   07/29/2017, 12:48 PM

## 2017-07-29 NOTE — Patient Instructions (Signed)
Steps to Quit Smoking Smoking tobacco can be bad for your health. It can also affect almost every organ in your body. Smoking puts you and people around you at risk for many serious long-lasting (chronic) diseases. Quitting smoking is hard, but it is one of the best things that you can do for your health. It is never too late to quit. What are the benefits of quitting smoking? When you quit smoking, you lower your risk for getting serious diseases and conditions. They can include:  Lung cancer or lung disease.  Heart disease.  Stroke.  Heart attack.  Not being able to have children (infertility).  Weak bones (osteoporosis) and broken bones (fractures).  If you have coughing, wheezing, and shortness of breath, those symptoms may get better when you quit. You may also get sick less often. If you are pregnant, quitting smoking can help to lower your chances of having a baby of low birth weight. What can I do to help me quit smoking? Talk with your doctor about what can help you quit smoking. Some things you can do (strategies) include:  Quitting smoking totally, instead of slowly cutting back how much you smoke over a period of time.  Going to in-person counseling. You are more likely to quit if you go to many counseling sessions.  Using resources and support systems, such as: ? Online chats with a counselor. ? Phone quitlines. ? Printed self-help materials. ? Support groups or group counseling. ? Text messaging programs. ? Mobile phone apps or applications.  Taking medicines. Some of these medicines may have nicotine in them. If you are pregnant or breastfeeding, do not take any medicines to quit smoking unless your doctor says it is okay. Talk with your doctor about counseling or other things that can help you.  Talk with your doctor about using more than one strategy at the same time, such as taking medicines while you are also going to in-person counseling. This can help make  quitting easier. What things can I do to make it easier to quit? Quitting smoking might feel very hard at first, but there is a lot that you can do to make it easier. Take these steps:  Talk to your family and friends. Ask them to support and encourage you.  Call phone quitlines, reach out to support groups, or work with a counselor.  Ask people who smoke to not smoke around you.  Avoid places that make you want (trigger) to smoke, such as: ? Bars. ? Parties. ? Smoke-break areas at work.  Spend time with people who do not smoke.  Lower the stress in your life. Stress can make you want to smoke. Try these things to help your stress: ? Getting regular exercise. ? Deep-breathing exercises. ? Yoga. ? Meditating. ? Doing a body scan. To do this, close your eyes, focus on one area of your body at a time from head to toe, and notice which parts of your body are tense. Try to relax the muscles in those areas.  Download or buy apps on your mobile phone or tablet that can help you stick to your quit plan. There are many free apps, such as QuitGuide from the CDC (Centers for Disease Control and Prevention). You can find more support from smokefree.gov and other websites.  This information is not intended to replace advice given to you by your health care provider. Make sure you discuss any questions you have with your health care provider. Document Released: 09/06/2009 Document   Revised: 07/08/2016 Document Reviewed: 03/27/2015 Elsevier Interactive Patient Education  2018 [place] Eating Plan DASH stands for "Dietary Approaches to Stop Hypertension." The DASH eating plan is a healthy eating plan that has been shown to reduce high blood pressure (hypertension). It may also reduce your risk for type 2 diabetes, heart disease, and stroke. The DASH eating plan may also help with weight loss. What are tips for following this plan? General guidelines  Avoid eating more than 2,300 mg  (milligrams) of salt (sodium) a day. If you have hypertension, you may need to reduce your sodium intake to 1,500 mg a day.  Limit alcohol intake to no more than 1 drink a day for nonpregnant women and 2 drinks a day for men. One drink equals 12 oz of beer, 5 oz of wine, or 1 oz of hard liquor.  Work with your health care provider to maintain a healthy body weight or to lose weight. Ask what an ideal weight is for you.  Get at least 30 minutes of exercise that causes your heart to beat faster (aerobic exercise) most days of the week. Activities may include walking, swimming, or biking.  Work with your health care provider or diet and nutrition specialist (dietitian) to adjust your eating plan to your individual calorie needs. Reading food labels  Check food labels for the amount of sodium per serving. Choose foods with less than 5 percent of the Daily Value of sodium. Generally, foods with less than 300 mg of sodium per serving fit into this eating plan.  To find whole grains, look for the word "whole" as the first word in the ingredient list. Shopping  Buy products labeled as "low-sodium" or "no salt added."  Buy fresh foods. Avoid canned foods and premade or frozen meals. Cooking  Avoid adding salt when cooking. Use salt-free seasonings or herbs instead of table salt or sea salt. Check with your health care provider or pharmacist before using salt substitutes.  Do not fry foods. Cook foods using healthy methods such as baking, boiling, grilling, and broiling instead.  Cook with heart-healthy oils, such as olive, canola, soybean, or sunflower oil. Meal planning   Eat a balanced diet that includes: ? 5 or more servings of fruits and vegetables each day. At each meal, try to fill half of your plate with fruits and vegetables. ? Up to 6-8 servings of whole grains each day. ? Less than 6 oz of lean meat, poultry, or fish each day. A 3-oz serving of meat is about the same size as a deck  of cards. One egg equals 1 oz. ? 2 servings of low-fat dairy each day. ? A serving of nuts, seeds, or beans 5 times each week. ? Heart-healthy fats. Healthy fats called Omega-3 fatty acids are found in foods such as flaxseeds and coldwater fish, like sardines, salmon, and mackerel.  Limit how much you eat of the following: ? Canned or prepackaged foods. ? Food that is high in trans fat, such as fried foods. ? Food that is high in saturated fat, such as fatty meat. ? Sweets, desserts, sugary drinks, and other foods with added sugar. ? Full-fat dairy products.  Do not salt foods before eating.  Try to eat at least 2 vegetarian meals each week.  Eat more home-cooked food and less restaurant, buffet, and fast food.  When eating at a restaurant, ask that your food be prepared with less salt or no salt, if possible. What foods are recommended? The  items listed may not be a complete list. Talk with your dietitian about what dietary choices are best for you. Grains Whole-grain or whole-wheat bread. Whole-grain or whole-wheat pasta. Brown rice. [place]. Bulgur. Whole-grain and low-sodium cereals. Pita bread. Low-fat, low-sodium crackers. Whole-wheat flour tortillas. Vegetables Fresh or frozen vegetables (raw, steamed, roasted, or grilled). Low-sodium or reduced-sodium tomato and vegetable juice. Low-sodium or reduced-sodium tomato sauce and tomato paste. Low-sodium or reduced-sodium canned vegetables. Fruits All fresh, dried, or frozen fruit. Canned fruit in natural juice (without added sugar). Meat and other protein foods Skinless chicken or [place]. Ground chicken or [place]. Pork with fat trimmed off. Fish and seafood. Egg whites. Dried beans, peas, or lentils. Unsalted nuts, nut butters, and seeds. Unsalted canned beans. Lean cuts of beef with fat trimmed off. Low-sodium, lean deli meat. Dairy Low-fat (1%) or fat-free (skim) milk. Fat-free, low-fat, or reduced-fat cheeses. Nonfat,  low-sodium ricotta or cottage cheese. Low-fat or nonfat yogurt. Low-fat, low-sodium cheese. Fats and oils Soft margarine without trans fats. Vegetable oil. Low-fat, reduced-fat, or light mayonnaise and salad dressings (reduced-sodium). Canola, safflower, olive, soybean, and sunflower oils. Avocado. Seasoning and other foods Herbs. Spices. Seasoning mixes without salt. Unsalted popcorn and pretzels. Fat-free sweets. What foods are not recommended? The items listed may not be a complete list. Talk with your dietitian about what dietary choices are best for you. Grains Baked goods made with fat, such as croissants, muffins, or some breads. Dry pasta or rice meal packs. Vegetables Creamed or fried vegetables. Vegetables in a cheese sauce. Regular canned vegetables (not low-sodium or reduced-sodium). Regular canned tomato sauce and paste (not low-sodium or reduced-sodium). Regular tomato and vegetable juice (not low-sodium or reduced-sodium). Angie Fava. Olives. Fruits Canned fruit in a light or heavy syrup. Fried fruit. Fruit in cream or butter sauce. Meat and other protein foods Fatty cuts of meat. Ribs. Fried meat. Berniece Salines. Sausage. Bologna and other processed lunch meats. Salami. Fatback. Hotdogs. Bratwurst. Salted nuts and seeds. Canned beans with added salt. Canned or smoked fish. Whole eggs or egg yolks. Chicken or [place] with skin. Dairy Whole or 2% milk, cream, and half-and-half. Whole or full-fat cream cheese. Whole-fat or sweetened yogurt. Full-fat cheese. Nondairy creamers. Whipped toppings. Processed cheese and cheese spreads. Fats and oils Butter. Stick margarine. Lard. Shortening. Ghee. Bacon fat. Tropical oils, such as coconut, palm kernel, or palm oil. Seasoning and other foods Salted popcorn and pretzels. Onion salt, garlic salt, seasoned salt, table salt, and sea salt. Worcestershire sauce. Tartar sauce. Barbecue sauce. Teriyaki sauce. Soy sauce, including reduced-sodium. Steak sauce.  Canned and packaged gravies. Fish sauce. Oyster sauce. Cocktail sauce. Horseradish that you find on the shelf. Ketchup. Mustard. Meat flavorings and tenderizers. Bouillon cubes. Hot sauce and Tabasco sauce. Premade or packaged marinades. Premade or packaged taco seasonings. Relishes. Regular salad dressings. Where to find more information:  National Heart, Lung, and [place]: https://wilson-eaton.com/  American Heart Association: www.heart.org Summary  The DASH eating plan is a healthy eating plan that has been shown to reduce high blood pressure (hypertension). It may also reduce your risk for type 2 diabetes, heart disease, and stroke.  With the DASH eating plan, you should limit salt (sodium) intake to 2,300 mg a day. If you have hypertension, you may need to reduce your sodium intake to 1,500 mg a day.  When on the DASH eating plan, aim to eat more fresh fruits and vegetables, whole grains, lean proteins, low-fat dairy, and heart-healthy fats.  Work with your health care provider  or diet and nutrition specialist (dietitian) to adjust your eating plan to your individual calorie needs. This information is not intended to replace advice given to you by your health care provider. Make sure you discuss any questions you have with your health care provider. Document Released: 10/30/2011 Document Revised: 11/03/2016 Document Reviewed: 11/03/2016 Elsevier Interactive Patient Education  2017 Elsevier Inc. Hypertension Hypertension, commonly called high blood pressure, is when the force of blood pumping through the arteries is too strong. The arteries are the blood vessels that carry blood from the heart throughout the body. Hypertension forces the heart to work harder to pump blood and may cause arteries to become narrow or stiff. Having untreated or uncontrolled hypertension can cause heart attacks, strokes, kidney disease, and other problems. A blood pressure reading consists of a higher number over  a lower number. Ideally, your blood pressure should be below 120/80. The first ("top") number is called the systolic pressure. It is a measure of the pressure in your arteries as your heart beats. The second ("bottom") number is called the diastolic pressure. It is a measure of the pressure in your arteries as the heart relaxes. What are the causes? The cause of this condition is not known. What increases the risk? Some risk factors for high blood pressure are under your control. Others are not. Factors you can change  Smoking.  Having type 2 diabetes mellitus, high cholesterol, or both.  Not getting enough exercise or physical activity.  Being overweight.  Having too much fat, sugar, calories, or salt (sodium) in your diet.  Drinking too much alcohol. Factors that are difficult or impossible to change  Having chronic kidney disease.  Having a family history of high blood pressure.  Age. Risk increases with age.  Race. You may be at higher risk if you are African-American.  Gender. Men are at higher risk than women before age 57. After age 67, women are at higher risk than men.  Having obstructive sleep apnea.  Stress. What are the signs or symptoms? Extremely high blood pressure (hypertensive crisis) may cause:  Headache.  Anxiety.  Shortness of breath.  Nosebleed.  Nausea and vomiting.  Severe chest pain.  Jerky movements you cannot control (seizures).  How is this diagnosed? This condition is diagnosed by measuring your blood pressure while you are seated, with your arm resting on a surface. The cuff of the blood pressure monitor will be placed directly against the skin of your upper arm at the level of your heart. It should be measured at least twice using the same arm. Certain conditions can cause a difference in blood pressure between your right and left arms. Certain factors can cause blood pressure readings to be lower or higher than normal (elevated) for a  short period of time:  When your blood pressure is higher when you are in a health care provider's office than when you are at home, this is called white coat hypertension. Most people with this condition do not need medicines.  When your blood pressure is higher at home than when you are in a health care provider's office, this is called masked hypertension. Most people with this condition may need medicines to control blood pressure.  If you have a high blood pressure reading during one visit or you have normal blood pressure with other risk factors:  You may be asked to return on a different day to have your blood pressure checked again.  You may be asked to monitor your  blood pressure at home for 1 week or longer.  If you are diagnosed with hypertension, you may have other blood or imaging tests to help your health care provider understand your overall risk for other conditions. How is this treated? This condition is treated by making healthy lifestyle changes, such as eating healthy foods, exercising more, and reducing your alcohol intake. Your health care provider may prescribe medicine if lifestyle changes are not enough to get your blood pressure under control, and if:  Your systolic blood pressure is above 130.  Your diastolic blood pressure is above 80.  Your personal target blood pressure may vary depending on your medical conditions, your age, and other factors. Follow these instructions at home: Eating and drinking  Eat a diet that is high in fiber and potassium, and low in sodium, added sugar, and fat. An example eating plan is called the DASH (Dietary Approaches to Stop Hypertension) diet. To eat this way: ? Eat plenty of fresh fruits and vegetables. Try to fill half of your plate at each meal with fruits and vegetables. ? Eat whole grains, such as whole wheat pasta, brown rice, or whole grain bread. Fill about one quarter of your plate with whole grains. ? Eat or drink  low-fat dairy products, such as skim milk or low-fat yogurt. ? Avoid fatty cuts of meat, processed or cured meats, and poultry with skin. Fill about one quarter of your plate with lean proteins, such as fish, chicken without skin, beans, eggs, and tofu. ? Avoid premade and processed foods. These tend to be higher in sodium, added sugar, and fat.  Reduce your daily sodium intake. Most people with hypertension should eat less than 1,500 mg of sodium a day.  Limit alcohol intake to no more than 1 drink a day for nonpregnant women and 2 drinks a day for men. One drink equals 12 oz of beer, 5 oz of wine, or 1 oz of hard liquor. Lifestyle  Work with your health care provider to maintain a healthy body weight or to lose weight. Ask what an ideal weight is for you.  Get at least 30 minutes of exercise that causes your heart to beat faster (aerobic exercise) most days of the week. Activities may include walking, swimming, or biking.  Include exercise to strengthen your muscles (resistance exercise), such as pilates or lifting weights, as part of your weekly exercise routine. Try to do these types of exercises for 30 minutes at least 3 days a week.  Do not use any products that contain nicotine or tobacco, such as cigarettes and e-cigarettes. If you need help quitting, ask your health care provider.  Monitor your blood pressure at home as told by your health care provider.  Keep all follow-up visits as told by your health care provider. This is important. Medicines  Take over-the-counter and prescription medicines only as told by your health care provider. Follow directions carefully. Blood pressure medicines must be taken as prescribed.  Do not skip doses of blood pressure medicine. Doing this puts you at risk for problems and can make the medicine less effective.  Ask your health care provider about side effects or reactions to medicines that you should watch for. Contact a health care provider  if:  You think you are having a reaction to a medicine you are taking.  You have headaches that keep coming back (recurring).  You feel dizzy.  You have swelling in your ankles.  You have trouble with your vision. Get  help right away if:  You develop a severe headache or confusion.  You have unusual weakness or numbness.  You feel faint.  You have severe pain in your chest or abdomen.  You vomit repeatedly.  You have trouble breathing. Summary  Hypertension is when the force of blood pumping through your arteries is too strong. If this condition is not controlled, it may put you at risk for serious complications.  Your personal target blood pressure may vary depending on your medical conditions, your age, and other factors. For most people, a normal blood pressure is less than 120/80.  Hypertension is treated with lifestyle changes, medicines, or a combination of both. Lifestyle changes include weight loss, eating a healthy, low-sodium diet, exercising more, and limiting alcohol. This information is not intended to replace advice given to you by your health care provider. Make sure you discuss any questions you have with your health care provider. Document Released: 11/10/2005 Document Revised: 10/08/2016 Document Reviewed: 10/08/2016 Elsevier Interactive Patient Education  [employer].

## 2017-07-31 ENCOUNTER — Ambulatory Visit: Payer: Self-pay | Attending: Internal Medicine | Admitting: *Deleted

## 2017-07-31 VITALS — BP 217/127 | HR 73

## 2017-07-31 DIAGNOSIS — I10 Essential (primary) hypertension: Secondary | ICD-10-CM | POA: Insufficient documentation

## 2017-07-31 DIAGNOSIS — Z048 Encounter for examination and observation for other specified reasons: Secondary | ICD-10-CM | POA: Insufficient documentation

## 2017-07-31 NOTE — Progress Notes (Signed)
Pt arrived to [facility], alert and oriented.  Last OV 07/29/2017 with PCP.  Pt denies chest pain, SOB, HA, dizziness, or blurred vision.  Verified medication with patient. She admits that  She has not taken any medication today. She adds that she takes medication at night because it makes her very sleepy.   Blood pressure reading 217/127.  Pt informed of hypertension protocol. She does not want to take medication for fear she will be too sleepy to drive home, however pt did take HCTZ and Amlodipine while in office. She left shortly after.   Encouraged patient to spread out her medication for hypertension throughout the day. Pt agreed she would try to do this.   Pt PCP aware of nurse visit/ BP check.

## 2017-08-06 ENCOUNTER — Encounter (HOSPITAL_COMMUNITY): Payer: Self-pay | Admitting: Emergency Medicine

## 2017-08-06 ENCOUNTER — Emergency Department (HOSPITAL_COMMUNITY): Payer: Self-pay

## 2017-08-06 ENCOUNTER — Inpatient Hospital Stay (HOSPITAL_COMMUNITY)
Admission: EM | Admit: 2017-08-06 | Discharge: 2017-08-11 | DRG: 287 | Disposition: A | Discharge status: Home / Self Care | Payer: Self-pay | Attending: Internal Medicine | Admitting: Internal Medicine

## 2017-08-06 DIAGNOSIS — Z7982 Long term (current) use of aspirin: Secondary | ICD-10-CM

## 2017-08-06 DIAGNOSIS — I25810 Atherosclerosis of coronary artery bypass graft(s) without angina pectoris: Secondary | ICD-10-CM

## 2017-08-06 DIAGNOSIS — D72829 Elevated white blood cell count, unspecified: Secondary | ICD-10-CM | POA: Diagnosis present

## 2017-08-06 DIAGNOSIS — N179 Acute kidney failure, unspecified: Secondary | ICD-10-CM | POA: Diagnosis present

## 2017-08-06 DIAGNOSIS — I10 Essential (primary) hypertension: Secondary | ICD-10-CM | POA: Diagnosis present

## 2017-08-06 DIAGNOSIS — Z720 Tobacco use: Secondary | ICD-10-CM

## 2017-08-06 DIAGNOSIS — Z91010 Allergy to peanuts: Secondary | ICD-10-CM

## 2017-08-06 DIAGNOSIS — Z8249 Family history of ischemic heart disease and other diseases of the circulatory system: Secondary | ICD-10-CM

## 2017-08-06 DIAGNOSIS — E7439 Other disorders of intestinal carbohydrate absorption: Secondary | ICD-10-CM | POA: Diagnosis present

## 2017-08-06 DIAGNOSIS — I2510 Atherosclerotic heart disease of native coronary artery without angina pectoris: Principal | ICD-10-CM | POA: Diagnosis present

## 2017-08-06 DIAGNOSIS — Z91013 Allergy to seafood: Secondary | ICD-10-CM

## 2017-08-06 DIAGNOSIS — E669 Obesity, unspecified: Secondary | ICD-10-CM | POA: Diagnosis present

## 2017-08-06 DIAGNOSIS — R9439 Abnormal result of other cardiovascular function study: Secondary | ICD-10-CM

## 2017-08-06 DIAGNOSIS — E876 Hypokalemia: Secondary | ICD-10-CM | POA: Diagnosis present

## 2017-08-06 DIAGNOSIS — I160 Hypertensive urgency: Secondary | ICD-10-CM | POA: Diagnosis present

## 2017-08-06 DIAGNOSIS — Z6838 Body mass index (BMI) 38.0-38.9, adult: Secondary | ICD-10-CM

## 2017-08-06 DIAGNOSIS — R778 Other specified abnormalities of plasma proteins: Secondary | ICD-10-CM

## 2017-08-06 DIAGNOSIS — Z880 Allergy status to penicillin: Secondary | ICD-10-CM

## 2017-08-06 DIAGNOSIS — Z881 Allergy status to other antibiotic agents status: Secondary | ICD-10-CM

## 2017-08-06 DIAGNOSIS — R079 Chest pain, unspecified: Secondary | ICD-10-CM | POA: Diagnosis present

## 2017-08-06 DIAGNOSIS — R319 Hematuria, unspecified: Secondary | ICD-10-CM

## 2017-08-06 DIAGNOSIS — F17210 Nicotine dependence, cigarettes, uncomplicated: Secondary | ICD-10-CM | POA: Diagnosis present

## 2017-08-06 DIAGNOSIS — R7989 Other specified abnormal findings of blood chemistry: Secondary | ICD-10-CM

## 2017-08-06 LAB — BASIC METABOLIC PANEL
ANION GAP: 9 (ref 5–15)
BUN: 17 mg/dL (ref 6–20)
CALCIUM: 9.1 mg/dL (ref 8.9–10.3)
CO2: 31 mmol/L (ref 22–32)
Chloride: 98 mmol/L — ABNORMAL LOW (ref 101–111)
Creatinine, Ser: 1.4 mg/dL — ABNORMAL HIGH (ref 0.44–1.00)
GFR calc Af Amer: 49 mL/min — ABNORMAL LOW (ref >60)
GFR, EST NON AFRICAN AMERICAN: 42 mL/min — AB (ref >60)
GLUCOSE: 123 mg/dL — AB (ref 65–99)
Potassium: 2.9 mmol/L — ABNORMAL LOW (ref 3.5–5.1)
SODIUM: 138 mmol/L (ref 135–145)

## 2017-08-06 LAB — POCT I-STAT TROPONIN I: Troponin i, poc: 0.04 ng/mL (ref 0.00–0.08)

## 2017-08-06 LAB — CBC
HCT: 40.3 % (ref 36.0–46.0)
HEMOGLOBIN: 13.7 g/dL (ref 12.0–15.0)
MCH: 28.5 pg (ref 26.0–34.0)
MCHC: 34 g/dL (ref 30.0–36.0)
MCV: 84 fL (ref 78.0–100.0)
Platelets: 321 10*3/uL (ref 150–400)
RBC: 4.8 MIL/uL (ref 3.87–5.11)
RDW: 15.4 % (ref 11.5–15.5)
WBC: 11 10*3/uL — AB (ref 4.0–10.5)

## 2017-08-06 LAB — URINALYSIS, ROUTINE W REFLEX MICROSCOPIC
BACTERIA UA: NONE SEEN
Bilirubin Urine: NEGATIVE
Glucose, UA: NEGATIVE mg/dL
KETONES UR: NEGATIVE mg/dL
Leukocytes, UA: NEGATIVE
NITRITE: NEGATIVE
Protein, ur: NEGATIVE mg/dL
Specific Gravity, Urine: >1.046 — ABNORMAL HIGH (ref 1.005–1.030)
WBC UA: NONE SEEN WBC/hpf (ref 0–5)
pH: 5 (ref 5.0–8.0)

## 2017-08-06 MED ORDER — NITROGLYCERIN 0.4 MG SL SUBL: 0.4000 mg | SUBLINGUAL_TABLET | SUBLINGUAL | Status: DC | PRN

## 2017-08-06 MED ORDER — HYDROCHLOROTHIAZIDE 25 MG PO TABS
25.0000 mg | ORAL_TABLET | Freq: Every day | ORAL | Status: DC
  Administered 2017-08-07 – 2017-08-08 (×2): 25 mg via ORAL
  Filled 2017-08-06 (×2): qty 1

## 2017-08-06 MED ORDER — LABETALOL HCL 5 MG/ML IV SOLN
10.0000 mg | INTRAVENOUS | Status: DC | PRN
  Filled 2017-08-06: qty 4

## 2017-08-06 MED ORDER — METOPROLOL TARTRATE 50 MG PO TABS
50.0000 mg | ORAL_TABLET | Freq: Two times a day (BID) | ORAL | Status: DC
  Administered 2017-08-07 – 2017-08-11 (×9): 50 mg via ORAL
  Filled 2017-08-06 (×2): qty 2
  Filled 2017-08-06: qty 1
  Filled 2017-08-06 (×4): qty 2
  Filled 2017-08-06: qty 1
  Filled 2017-08-06: qty 2

## 2017-08-06 MED ORDER — IOPAMIDOL (ISOVUE-370) INJECTION 76%
INTRAVENOUS | Status: AC
  Filled 2017-08-06: qty 100

## 2017-08-06 MED ORDER — HYDRALAZINE HCL 50 MG PO TABS
50.0000 mg | ORAL_TABLET | Freq: Three times a day (TID) | ORAL | Status: DC
  Administered 2017-08-07 – 2017-08-08 (×4): 50 mg via ORAL
  Filled 2017-08-06 (×4): qty 1

## 2017-08-06 MED ORDER — LORAZEPAM 2 MG/ML IJ SOLN
0.5000 mg | Freq: Once | INTRAMUSCULAR | Status: AC
  Administered 2017-08-06: 0.5 mg via INTRAVENOUS
  Filled 2017-08-06: qty 1

## 2017-08-06 MED ORDER — MAGNESIUM SULFATE IN D5W 1-5 GM/100ML-% IV SOLN
1.0000 g | Freq: Once | INTRAVENOUS | Status: AC
  Administered 2017-08-07: 1 g via INTRAVENOUS
  Filled 2017-08-06: qty 100

## 2017-08-06 MED ORDER — LABETALOL HCL 5 MG/ML IV SOLN
20.0000 mg | Freq: Once | INTRAVENOUS | Status: AC
  Administered 2017-08-06: 20 mg via INTRAVENOUS
  Filled 2017-08-06: qty 4

## 2017-08-06 MED ORDER — SODIUM CHLORIDE 0.9 % IV BOLUS (SEPSIS)
500.0000 mL | Freq: Once | INTRAVENOUS | Status: DC
  Administered 2017-08-06: 500 mL via INTRAVENOUS

## 2017-08-06 MED ORDER — ACETAMINOPHEN 500 MG PO TABS
1000.0000 mg | ORAL_TABLET | Freq: Once | ORAL | Status: AC
  Administered 2017-08-06: 1000 mg via ORAL
  Filled 2017-08-06: qty 2

## 2017-08-06 MED ORDER — ONDANSETRON HCL 4 MG/2ML IJ SOLN
4.0000 mg | Freq: Four times a day (QID) | INTRAMUSCULAR | Status: DC | PRN
Start: 2017-08-06 — End: 2017-08-10

## 2017-08-06 MED ORDER — ENOXAPARIN SODIUM 40 MG/0.4ML ~~LOC~~ SOLN
40.0000 mg | Freq: Every day | SUBCUTANEOUS | Status: DC
  Administered 2017-08-07: 40 mg via SUBCUTANEOUS
  Filled 2017-08-06 (×5): qty 0.4

## 2017-08-06 MED ORDER — ASPIRIN EC 81 MG PO TBEC
81.0000 mg | DELAYED_RELEASE_TABLET | Freq: Every day | ORAL | Status: DC
  Administered 2017-08-07 – 2017-08-11 (×4): 81 mg via ORAL
  Filled 2017-08-06 (×5): qty 1

## 2017-08-06 MED ORDER — POTASSIUM CHLORIDE CRYS ER 10 MEQ PO TBCR
10.0000 meq | EXTENDED_RELEASE_TABLET | Freq: Every day | ORAL | Status: DC
  Administered 2017-08-07 – 2017-08-11 (×5): 10 meq via ORAL
  Filled 2017-08-06 (×5): qty 1

## 2017-08-06 MED ORDER — AMLODIPINE BESYLATE 10 MG PO TABS
10.0000 mg | ORAL_TABLET | Freq: Every day | ORAL | Status: DC
  Administered 2017-08-07 – 2017-08-11 (×5): 10 mg via ORAL
  Filled 2017-08-06 (×3): qty 2
  Filled 2017-08-06: qty 1
  Filled 2017-08-06: qty 2

## 2017-08-06 MED ORDER — POTASSIUM CHLORIDE IN NACL 20-0.9 MEQ/L-% IV SOLN
INTRAVENOUS | Status: AC
  Administered 2017-08-07: 01:00:00 via INTRAVENOUS
  Filled 2017-08-06: qty 1000

## 2017-08-06 MED ORDER — MORPHINE SULFATE (PF) 4 MG/ML IV SOLN
4.0000 mg | Freq: Once | INTRAVENOUS | Status: DC
  Filled 2017-08-06: qty 1

## 2017-08-06 MED ORDER — MORPHINE SULFATE (PF) 2 MG/ML IV SOLN
2.0000 mg | INTRAVENOUS | Status: DC | PRN
  Administered 2017-08-07: 2 mg via INTRAVENOUS
  Filled 2017-08-06: qty 1

## 2017-08-06 MED ORDER — ATORVASTATIN CALCIUM 40 MG PO TABS
40.0000 mg | ORAL_TABLET | Freq: Every day | ORAL | Status: DC
  Administered 2017-08-07 – 2017-08-09 (×3): 40 mg via ORAL
  Filled 2017-08-06 (×3): qty 1

## 2017-08-06 MED ORDER — POTASSIUM CHLORIDE CRYS ER 20 MEQ PO TBCR
40.0000 meq | EXTENDED_RELEASE_TABLET | Freq: Once | ORAL | Status: AC
  Administered 2017-08-06: 40 meq via ORAL
  Filled 2017-08-06: qty 2

## 2017-08-06 MED ORDER — ASPIRIN 81 MG PO CHEW: 324.0000 mg | CHEWABLE_TABLET | Freq: Once | ORAL | Status: DC

## 2017-08-06 MED ORDER — IOPAMIDOL (ISOVUE-370) INJECTION 76%
100.0000 mL | Freq: Once | INTRAVENOUS | Status: AC | PRN
Start: 2017-08-06 — End: 2017-08-06
  Administered 2017-08-06: 100 mL via INTRAVENOUS

## 2017-08-06 MED ORDER — POTASSIUM CHLORIDE 10 MEQ/100ML IV SOLN
10.0000 meq | Freq: Once | INTRAVENOUS | Status: AC
  Administered 2017-08-06: 10 meq via INTRAVENOUS
  Filled 2017-08-06: qty 100

## 2017-08-06 MED ORDER — ASPIRIN 81 MG PO CHEW
324.0000 mg | CHEWABLE_TABLET | Freq: Once | ORAL | Status: AC
  Administered 2017-08-06: 324 mg via ORAL
  Filled 2017-08-06: qty 4

## 2017-08-06 MED ORDER — SODIUM CHLORIDE 0.9 % IV SOLN
Freq: Once | INTRAVENOUS | Status: AC
  Administered 2017-08-06: 23:00:00 via INTRAVENOUS

## 2017-08-06 MED ORDER — ONDANSETRON HCL 4 MG/2ML IJ SOLN
4.0000 mg | Freq: Once | INTRAMUSCULAR | Status: DC
  Filled 2017-08-06: qty 2

## 2017-08-06 MED ORDER — ACETAMINOPHEN 325 MG PO TABS
650.0000 mg | ORAL_TABLET | ORAL | Status: DC | PRN
  Administered 2017-08-10: 650 mg via ORAL

## 2017-08-06 MED ORDER — NITROGLYCERIN 0.4 MG SL SUBL
0.4000 mg | SUBLINGUAL_TABLET | SUBLINGUAL | Status: DC | PRN
  Administered 2017-08-06 (×2): 0.4 mg via SUBLINGUAL
  Filled 2017-08-06 (×2): qty 1

## 2017-08-06 NOTE — ED Triage Notes (Signed)
Pt from home with gradual onset CP that began while she was cooking. Pt states pain radiates to left arm. Pt reports SOB, but is able to maintain oxygen saturation at 100% and is speaking in full sentences. Pt has strong bilateral radial pulses and adequate cap refill

## 2017-08-06 NOTE — ED Notes (Signed)
Spoke with personnel on floor and they stated they would have Janett Billow, RN call me back

## 2017-08-06 NOTE — ED Notes (Signed)
Attempted to call report without answer.

## 2017-08-06 NOTE — H&P (Signed)
History and Physical    Tracie Estrada IPJ:[id number] DOB: [birth year] DOA: 08/06/2017  PCP: Tresa Garter, MD   Patient coming from: Home  Chief Complaint: Chest pain   HPI: Tracie Estrada is a 51 y.o. female with medical history significant for accelerated hypertension, now presenting to the emergency department with acute onset of chest pain. Patient reports that she been in her usual state of health and was cooking at home when she developed the acute onset of chest pain described as an intense pressure in the central chest with radiation to the left arm, worse with exertion, intermittent, and associated with mild dyspnea. She denies any nausea or vomiting and denies any diaphoresis. No recent fevers, chills, or cough. She had not taken any of her blood pressure medications today. Denies headache, change in vision or hearing, or focal numbness or weakness.  ED Course: Upon arrival to the ED, patient is found to be afebrile, saturating well on room air, tachycardic in the low 100s, and with blood pressure of 210/130.chest x-ray features borderline cardiomegaly but no acute cardiopulmonary disease. EKG features a sinus rhythm with LVH and secondary repolarization abnormality. Troponin is within the normal limits. Chemstrip panel is notable for potassium 2.9 and creatinine of 1.40, up from 0.8 in June. CBC features a mild leukocytosis to 11,000. CT angiogram of the chest, abdomen, and pelvis was performed and is negative for any acute process in the chest, abdomen, or pelvis. She was treated with 324 mg of aspirin, 20 mg IV labetalol, 4 mg IV morphine, Zofran, 40 mEq oral potassium, and 10 mEq IV potassium in the ED. Blood pressure normalized and chest pain nearly resolved with this. She will be observed on telemetry unit for ongoing evaluation and management of chest pressure with marked elevation in blood pressure and reassuring initial troponin and EKG.  Review of Systems:  All other systems  reviewed and apart from HPI, are negative.  Past Medical History:  Diagnosis Date  . Acute renal failure ([place]) 10/13/2013   On Vancomycin  . Breast abscess of female    Status post biopsy  . Hypertension   . Malignant hypertension 10/13/2013    Past Surgical History:  Procedure Laterality Date  . Biopsy of left breast    . INCISION AND DRAINAGE ABSCESS Left 10/11/2013   Procedure: INCISION AND DRAINAGE and debridement LEFT BREAST ABSCESS;  Surgeon: Odis Hollingshead, MD;  Location: [facility];  Service: General;  Laterality: Left;     reports that she has been smoking Cigarettes.  She has a 26.00 pack-year smoking history. She has never used smokeless tobacco. She reports that she does not drink alcohol or use drugs.  Allergies  Allergen Reactions  . Fish Allergy Anaphylaxis  . Peanuts [Peanut Oil] Anaphylaxis  . Other Other (See Comments)    Anesthesia.  Specific agent unknown.  Reaction unknown.   Marland Kitchen Penicillins Itching  . Vancomycin Other (See Comments)    Nephrotoxicity    Family History  Problem Relation Age of Onset  . Breast cancer Mother   . Cancer Mother        brain  . Hypertension Father   . Thyroid disease Daughter      Prior to Admission medications   Medication Sig Start Date End Date Taking? Authorizing Provider  amLODipine (NORVASC) 10 MG tablet TAKE 1 TABLET (10 MG TOTAL) BY MOUTH DAILY. 07/29/17  Yes Jegede, Olugbemiga E, MD  hydrALAZINE (APRESOLINE) 50 MG tablet Take 1 tablet (50  mg total) by mouth every 8 (eight) hours. 07/29/17  Yes Tresa Garter, MD  hydrochlorothiazide (HYDRODIURIL) 25 MG tablet Take 1 tablet (25 mg total) by mouth daily. 07/29/17  Yes Tresa Garter, MD  metoprolol tartrate (LOPRESSOR) 50 MG tablet Take 1 tablet (50 mg total) by mouth 2 (two) times daily. 07/29/17  Yes Tresa Garter, MD  potassium chloride (K-DUR) 10 MEQ tablet Take 1 tablet (10 mEq total) by mouth daily. 07/29/17  Yes Tresa Garter, MD  aspirin EC  81 MG tablet Take 1 tablet (81 mg total) by mouth daily. Patient not taking: Reported on 07/31/2017 11/20/16   Tresa Garter, MD    Physical Exam: Vitals:   08/06/17 1638 08/06/17 1934 08/06/17 2030 08/06/17 2230  BP: (!) 182/105 (!) 205/107 (!) 194/120 116/85  Pulse: 97 (!) 111 (!) 104 82  Resp: 18 20 (!) 23 18  Temp: 98.7 F (37.1 C)     TempSrc: Oral     SpO2: 100% 100% 100% 100%  Weight:      Height:          Constitutional: NAD, calm, comfortable Eyes: PERTLA, lids and conjunctivae normal ENMT: Mucous membranes are moist. Posterior pharynx clear of any exudate or lesions.   Neck: normal, supple, no masses, no thyromegaly Respiratory: mildly diminished bilaterally, occasional non-productive cough, no wheezing, no crackles. Normal respiratory effort.   Cardiovascular: S1 & S2 heard, regular rate and rhythm. No extremity edema. No significant JVD. Abdomen: No distension, no tenderness, no masses palpated. Bowel sounds normal.  Musculoskeletal: no clubbing / cyanosis. No joint deformity upper and lower extremities.     Skin: no significant rashes, lesions, ulcers. Warm, dry, well-perfused. Neurologic: CN 2-12 grossly intact. Sensation intact. Strength 5/5 in all 4 limbs.  Psychiatric: Normal judgment and insight. Alert and oriented x 3. Calm, cooperative.    Labs on Admission: I have personally reviewed following labs and imaging studies  CBC:  Recent Labs Lab 08/06/17 1613  WBC 11.0*  HGB 13.7  HCT 40.3  MCV 84.0  PLT 831   Basic Metabolic Panel:  Recent Labs Lab 08/06/17 1613  NA 138  K 2.9*  CL 98*  CO2 31  GLUCOSE 123*  BUN 17  CREATININE 1.40*  CALCIUM 9.1   GFR: Estimated Creatinine Clearance: 44.3 mL/min (A) (by C-G formula based on SCr of 1.4 mg/dL (H)). Liver Function Tests: No results for input(s): AST, ALT, ALKPHOS, BILITOT, PROT, ALBUMIN in the last 168 hours. No results for input(s): LIPASE, AMYLASE in the last 168 hours. No results  for input(s): AMMONIA in the last 168 hours. Coagulation Profile: No results for input(s): INR, PROTIME in the last 168 hours. Cardiac Enzymes: No results for input(s): CKTOTAL, CKMB, CKMBINDEX, TROPONINI in the last 168 hours. BNP (last 3 results) No results for input(s): PROBNP in the last 8760 hours. HbA1C: No results for input(s): HGBA1C in the last 72 hours. CBG: No results for input(s): GLUCAP in the last 168 hours. Lipid Profile: No results for input(s): CHOL, HDL, LDLCALC, TRIG, CHOLHDL, LDLDIRECT in the last 72 hours. Thyroid Function Tests: No results for input(s): TSH, T4TOTAL, FREET4, T3FREE, THYROIDAB in the last 72 hours. Anemia Panel: No results for input(s): VITAMINB12, FOLATE, FERRITIN, TIBC, IRON, RETICCTPCT in the last 72 hours. Urine analysis:    Component Value Date/Time   COLORURINE YELLOW 10/25/2013 1051   APPEARANCEUR CLEAR 10/25/2013 1051   LABSPEC 1.008 10/25/2013 1051   PHURINE 5.5 10/25/2013 1051  GLUCOSEU NEG 10/25/2013 1051   HGBUR TRACE (A) 10/25/2013 1051   HGBUR negative 08/23/2010 0936   BILIRUBINUR NEG 10/25/2013 1051   KETONESUR NEG 10/25/2013 1051   PROTEINUR NEG 10/25/2013 1051   UROBILINOGEN 0.2 10/25/2013 1051   NITRITE POS (A) 10/25/2013 1051   LEUKOCYTESUR TRACE (A) 10/25/2013 1051   Sepsis Labs: @LABRCNTIP (procalcitonin:4,lacticidven:4) )No results found for this or any previous visit (from the past 240 hour(s)).   Radiological Exams on Admission: Dg Chest 2 View  Result Date: 08/06/2017 CLINICAL DATA:  Generalized chest pain associated with shortness of breath. History of hypertension, current smoker. EXAM: CHEST  2 VIEW COMPARISON:  Portable chest x-ray of November 22, 2016 FINDINGS: The lungs are adequately inflated. There is no focal infiltrate. There is no pleural effusion. The heart is top-normal in size but stable. The pulmonary vascularity is normal. The mediastinum is normal in width. The bony thorax is unremarkable.  IMPRESSION: Borderline cardiomegaly less conspicuous than on the previous study. No pulmonary vascular congestion nor other acute cardiopulmonary abnormality. Electronically Signed   By: David  Martinique M.D.   On: 08/06/2017 15:42   Ct Angio Chest/abd/pel For Dissection W And/or Wo Contrast  Result Date: 08/06/2017 CLINICAL DATA:  Chest and back pain. Clinical concern for aortic dissection. EXAM: CT ANGIOGRAPHY CHEST, ABDOMEN AND PELVIS TECHNIQUE: Multidetector CT imaging through the chest, abdomen and pelvis was performed using the standard protocol during bolus administration of intravenous contrast. Multiplanar reconstructed images and MIPs were obtained and reviewed to evaluate the vascular anatomy. CONTRAST:  100 cc Isovue 370 IV COMPARISON:  Chest radiograph earlier this day. FINDINGS: CTA CHEST FINDINGS Cardiovascular: There is mild motion artifact through the ascending aorta. No aortic dissection or aneurysm. Mild noncalcified plaque about the descending aorta. No evidence of aortic hematoma. There is multi chamber cardiomegaly. No central pulmonary embolus to the lobar level. No pericardial effusion. Mediastinum/Nodes: No mediastinal or hilar adenopathy. No axillary adenopathy. The esophagus is decompressed. No visualized thyroid nodule. Lungs/Pleura: Mild hypoventilatory change at the lung bases. No consolidation, pulmonary edema or pleural effusion. Musculoskeletal: Minimal degenerative disc disease in the spine. Subchondral cysts in both shoulders. There are no acute or suspicious osseous abnormalities. Review of the MIP images confirms the above findings. CTA ABDOMEN AND PELVIS FINDINGS VASCULAR Aorta: Suboptimal contrast timing bolus. Contrast bolus timing diagnostic to exclude aortic dissection or aneurysm. There is mild atherosclerosis. Celiac: Patent without evidence of aneurysm, dissection, vasculitis or significant stenosis. SMA: Patent without evidence of aneurysm, dissection, vasculitis or  significant stenosis. Renals: Both renal arteries are patent without evidence of aneurysm, dissection, vasculitis, fibromuscular dysplasia or significant stenosis. IMA: Patent without evidence of aneurysm, dissection, vasculitis or significant stenosis. Inflow: Patent without evidence of aneurysm, dissection, vasculitis or significant stenosis. Contrast bolus timing is suboptimal for more detailed assessment. Veins: No obvious venous abnormality within the limitations of this arterial phase study. Mixing of contrast in the superior mesenteric and portal vein. Review of the MIP images confirms the above findings. NON-VASCULAR Hepatobiliary: No focal liver abnormality is seen. No gallstones, gallbladder wall thickening, or biliary dilatation. Pancreas: No ductal dilatation or inflammation. Spleen: Normal in size without focal abnormality. Adrenals/Urinary Tract: Normal adrenal glands. No hydronephrosis or perinephric edema. Ureters are decompressed. Urinary bladder is physiologically distended. No bladder wall thickening. Stomach/Bowel: Stomach is within normal limits. Appendix appears normal. No evidence of bowel wall thickening, distention, or inflammatory changes. Lymphatic: No adenopathy. Reproductive: Uterus and bilateral adnexa are unremarkable. Other: No free air, free fluid, or  intra-abdominal fluid collection. Tiny fat containing umbilical hernia. Musculoskeletal: There are no acute or suspicious osseous abnormalities. Facet arthropathy in the lumbar spine. Mild degenerative disc disease. Incidental 3 cm intramuscular lipoma within the right external oblique muscle. Review of the MIP images confirms the above findings. IMPRESSION: 1. No evidence of acute aortic abnormality. Mild thoracoabdominal aortic atherosclerosis. 2. Mild multi chamber cardiomegaly. 3. No acute abnormality in the chest, abdomen, or pelvis. Electronically Signed   By: Jeb Levering M.D.   On: 08/06/2017 21:49    EKG: Independently  reviewed. Sinus rhythm, LVH by voltage criteria with secondary repolarization abnormality.   Assessment/Plan  1. Chest pain  - Pt has hx of HTN, obesity, and smoking, presenting with chest pain with both typical and atypical features, found to have markedly elevated BP  - EKG not significantly changed from prior, troponin wnl, and CTA chest/abd/pelvis is unremarkable  - She was treated with labetalol, ASA 324, and morphine in the ED and pain has nearly resolved  - Plan to continue cardiac monitoring, obtain serial troponin measurements, repeat EKG, continue beta-blocker and ASA  2. Hypertension with hypertensive urgency  - BP in 210/130 range on arrival, which pt reports is "normal for me;" she did not take her medications today  - Improved with labetalol 20 mg IV given in ED  - Plan to continue Norvasc, hydralazine, Lopressor, and HCTZ  - Use labetalol IVP's prn   3. Hypokalemia  - Serum potassium is 2.9 on admission, possibly secondary to HCTZ  - Treated in ED with 40 mEq oral, and 10 mEq IV potassium  - Check renin and aldosterone, continue cardiac monitoring, and repeat chem panel in am     4. Acute kidney injury  - SCr is 1.40 on admission, up from 0.78 in June '18   - Possibly from markedly elevated BP, though pt reports her BP always that high; check UA - Plan to provide a gentle IVF hydration overnight and repeat chem panel in am    DVT prophylaxis: sq Lovenox  Code Status: Full  Family Communication: Discussed with patient Disposition Plan: Observe on telemetry Consults called: None Admission status: Observation    Vianne Bulls, MD [facility] Pager [phone]  If 7PM-7AM, please contact night-coverage [email or web address] Password [email or web address]  08/06/2017, 10:44 PM

## 2017-08-06 NOTE — ED Notes (Signed)
Delay in medication due to pt being in CT

## 2017-08-06 NOTE — ED Provider Notes (Signed)
[facility] DEPT Provider Note   CSN: [id number] Arrival date & time: 08/06/17  1354     History   Chief Complaint Chief Complaint  Patient presents with  . Chest Pain    HPI Tracie Estrada is a 51 y.o. female.  HPI   Tracie Estrada is a 51 y.o. female, with a history of hypertension, presenting to the ED with chest pain that came on suddenly while she was cooking around 1pm. Pain was across the chest, "felt like someone sitting on the chest," radiating down the left arm, worse with exertion, rated 7-8/10, now rated 5/10. Pain has been intermittent since onset. Also endorses a "pulling" pain from the chest into the back. Accompanied by shortness of breath with exertion.   Has not taken any of her HTN medications today. Last doses were last night. States her BP frequently runs in the 200s.  Denies N/V/D, fever/chills, dizziness, abdominal pain, syncope, or any other complaints.   Past Medical History:  Diagnosis Date  . Acute renal failure ([place]) 10/13/2013   On Vancomycin  . Breast abscess of female    Status post biopsy  . Hypertension   . Malignant hypertension 10/13/2013    Patient Active Problem List   Diagnosis Date Noted  . Hypertensive urgency 08/06/2017  . Chest pain 08/06/2017  . AKI (acute kidney injury) ([place]) 08/06/2017  . Cough 11/20/2016  . Accelerated hypertension 10/25/2013  . Chronic kidney disease 10/25/2013  . Breast abscess of female 10/25/2013  . Malignant hypertension 10/13/2013  . Acute renal failure ([place]) 10/13/2013  . Hypokalemia 10/13/2013  . Left breast abscess 10/10/2013  . Mastitis 10/10/2013  . Discharge of breast 09/27/2013  . Breast mass, left 09/27/2013  . HYPERLIPIDEMIA 08/06/2010  . ANXIETY 08/06/2010  . SMOKER 08/06/2010  . Essential hypertension, benign 08/06/2010    Past Surgical History:  Procedure Laterality Date  . Biopsy of left breast    . INCISION AND DRAINAGE ABSCESS Left 10/11/2013   Procedure: INCISION AND  DRAINAGE and debridement LEFT BREAST ABSCESS;  Surgeon: Odis Hollingshead, MD;  Location: [facility] ORS;  Service: General;  Laterality: Left;    OB History    Gravida Para Term Preterm AB Living   1 1 1     1    SAB TAB Ectopic Multiple Live Births                   Home Medications    Prior to Admission medications   Medication Sig Start Date End Date Taking? Authorizing Provider  amLODipine (NORVASC) 10 MG tablet TAKE 1 TABLET (10 MG TOTAL) BY MOUTH DAILY. 07/29/17  Yes Jegede, Olugbemiga E, MD  hydrALAZINE (APRESOLINE) 50 MG tablet Take 1 tablet (50 mg total) by mouth every 8 (eight) hours. 07/29/17  Yes Tresa Garter, MD  hydrochlorothiazide (HYDRODIURIL) 25 MG tablet Take 1 tablet (25 mg total) by mouth daily. 07/29/17  Yes Tresa Garter, MD  metoprolol tartrate (LOPRESSOR) 50 MG tablet Take 1 tablet (50 mg total) by mouth 2 (two) times daily. 07/29/17  Yes Tresa Garter, MD  potassium chloride (K-DUR) 10 MEQ tablet Take 1 tablet (10 mEq total) by mouth daily. 07/29/17  Yes Tresa Garter, MD  aspirin EC 81 MG tablet Take 1 tablet (81 mg total) by mouth daily. Patient not taking: Reported on 07/31/2017 11/20/16   Tresa Garter, MD    Family History Family History  Problem Relation Age of Onset  . Breast cancer  Mother   . Cancer Mother        brain  . Hypertension Father   . Thyroid disease Daughter     Social History Social History  Substance Use Topics  . Smoking status: Current Every Day Smoker    Packs/day: 1.00    Years: 26.00    Types: Cigarettes  . Smokeless tobacco: Never Used  . Alcohol use No     Allergies   Fish allergy; Peanuts [peanut oil]; Other; Penicillins; and Vancomycin   Review of Systems Review of Systems  Constitutional: Negative for chills, diaphoresis and fever.  Respiratory: Positive for shortness of breath.   Cardiovascular: Positive for chest pain.  Gastrointestinal: Negative for abdominal pain, diarrhea, nausea and  vomiting.  Musculoskeletal: Positive for back pain.  Neurological: Negative for dizziness, weakness, light-headedness, numbness and headaches.  All other systems reviewed and are negative.    Physical Exam Updated Vital Signs BP (!) 205/107 (BP Location: Left Arm)   Pulse (!) 111   Temp 98.7 F (37.1 C) (Oral)   Resp 20   Ht 4\' 11"  (1.499 m)   Wt 86.2 kg (190 lb)   LMP 07/20/2017   SpO2 100%   BMI 38.38 kg/m   Physical Exam  Constitutional: She appears well-developed and well-nourished. No distress.  HENT:  Head: Normocephalic and atraumatic.  Eyes: Conjunctivae are normal.  Neck: Neck supple.  Cardiovascular: Normal rate, regular rhythm, normal heart sounds and intact distal pulses.   Pulses:      Radial pulses are 2+ on the right side, and 2+ on the left side.       Dorsalis pedis pulses are 2+ on the right side, and 2+ on the left side.       Posterior tibial pulses are 2+ on the right side, and 2+ on the left side.  Pulmonary/Chest: Effort normal and breath sounds normal. No respiratory distress. She exhibits no tenderness.  Abdominal: Soft. There is no tenderness. There is no guarding.  Musculoskeletal: She exhibits no edema or tenderness.  No tenderness to patient's neck or back.  Lymphadenopathy:    She has no cervical adenopathy.  Neurological: She is alert.  No sensory deficits.  No noted speech deficits. No aphasia. Patient handles oral secretions without difficulty. No noted swallowing defects.  Equal grip strength bilaterally. Strength 5/5 in the upper extremities. Strength 5/5 with flexion and extension of the hips, knees, and ankles bilaterally.  Patellar DTRs 2+ bilaterally Negative Romberg. No gait disturbance.  Coordination intact including heel to shin and finger to nose.  Cranial nerves III-XII grossly intact.  No facial droop.   Skin: Skin is warm and dry. She is not diaphoretic.  Psychiatric: She has a normal mood and affect. Her behavior is  normal.  Nursing note and vitals reviewed.    ED Treatments / Results  Labs (all labs ordered are listed, but only abnormal results are displayed) Labs Reviewed  BASIC METABOLIC PANEL - Abnormal; Notable for the following:       Result Value   Potassium 2.9 (*)    Chloride 98 (*)    Glucose, Bld 123 (*)    Creatinine, Ser 1.40 (*)    GFR calc non Af Amer 42 (*)    GFR calc Af Amer 49 (*)    All other components within normal limits  CBC - Abnormal; Notable for the following:    WBC 11.0 (*)    All other components within normal limits  HIV ANTIBODY (  ROUTINE TESTING)  TROPONIN I  TROPONIN I  BASIC METABOLIC PANEL  CBC  URINALYSIS, ROUTINE W REFLEX MICROSCOPIC  I-STAT TROPONIN, ED  POCT I-STAT TROPONIN I    EKG  EKG Interpretation  Date/Time:  Thursday August 06 2017 14:34:59 EDT Ventricular Rate:  92 PR Interval:    QRS Duration: 102 QT Interval:  385 QTC Calculation: 477 R Axis:   2 Text Interpretation:  Sinus rhythm Probable left atrial enlargement LVH with secondary repolarization abnormality no significant change since 2017 Confirmed by Sherwood Gambler [phone]) on 08/06/2017 5:55:53 PM       Radiology Dg Chest 2 View  Result Date: 08/06/2017 CLINICAL DATA:  Generalized chest pain associated with shortness of breath. History of hypertension, current smoker. EXAM: CHEST  2 VIEW COMPARISON:  Portable chest x-ray of November 22, 2016 FINDINGS: The lungs are adequately inflated. There is no focal infiltrate. There is no pleural effusion. The heart is top-normal in size but stable. The pulmonary vascularity is normal. The mediastinum is normal in width. The bony thorax is unremarkable. IMPRESSION: Borderline cardiomegaly less conspicuous than on the previous study. No pulmonary vascular congestion nor other acute cardiopulmonary abnormality. Electronically Signed   By: David  Martinique M.D.   On: 08/06/2017 15:42   Ct Angio Chest/abd/pel For Dissection W And/or Wo  Contrast  Result Date: 08/06/2017 CLINICAL DATA:  Chest and back pain. Clinical concern for aortic dissection. EXAM: CT ANGIOGRAPHY CHEST, ABDOMEN AND PELVIS TECHNIQUE: Multidetector CT imaging through the chest, abdomen and pelvis was performed using the standard protocol during bolus administration of intravenous contrast. Multiplanar reconstructed images and MIPs were obtained and reviewed to evaluate the vascular anatomy. CONTRAST:  100 cc Isovue 370 IV COMPARISON:  Chest radiograph earlier this day. FINDINGS: CTA CHEST FINDINGS Cardiovascular: There is mild motion artifact through the ascending aorta. No aortic dissection or aneurysm. Mild noncalcified plaque about the descending aorta. No evidence of aortic hematoma. There is multi chamber cardiomegaly. No central pulmonary embolus to the lobar level. No pericardial effusion. Mediastinum/Nodes: No mediastinal or hilar adenopathy. No axillary adenopathy. The esophagus is decompressed. No visualized thyroid nodule. Lungs/Pleura: Mild hypoventilatory change at the lung bases. No consolidation, pulmonary edema or pleural effusion. Musculoskeletal: Minimal degenerative disc disease in the spine. Subchondral cysts in both shoulders. There are no acute or suspicious osseous abnormalities. Review of the MIP images confirms the above findings. CTA ABDOMEN AND PELVIS FINDINGS VASCULAR Aorta: Suboptimal contrast timing bolus. Contrast bolus timing diagnostic to exclude aortic dissection or aneurysm. There is mild atherosclerosis. Celiac: Patent without evidence of aneurysm, dissection, vasculitis or significant stenosis. SMA: Patent without evidence of aneurysm, dissection, vasculitis or significant stenosis. Renals: Both renal arteries are patent without evidence of aneurysm, dissection, vasculitis, fibromuscular dysplasia or significant stenosis. IMA: Patent without evidence of aneurysm, dissection, vasculitis or significant stenosis. Inflow: Patent without evidence  of aneurysm, dissection, vasculitis or significant stenosis. Contrast bolus timing is suboptimal for more detailed assessment. Veins: No obvious venous abnormality within the limitations of this arterial phase study. Mixing of contrast in the superior mesenteric and portal vein. Review of the MIP images confirms the above findings. NON-VASCULAR Hepatobiliary: No focal liver abnormality is seen. No gallstones, gallbladder wall thickening, or biliary dilatation. Pancreas: No ductal dilatation or inflammation. Spleen: Normal in size without focal abnormality. Adrenals/Urinary Tract: Normal adrenal glands. No hydronephrosis or perinephric edema. Ureters are decompressed. Urinary bladder is physiologically distended. No bladder wall thickening. Stomach/Bowel: Stomach is within normal limits. Appendix appears normal. No evidence of  bowel wall thickening, distention, or inflammatory changes. Lymphatic: No adenopathy. Reproductive: Uterus and bilateral adnexa are unremarkable. Other: No free air, free fluid, or intra-abdominal fluid collection. Tiny fat containing umbilical hernia. Musculoskeletal: There are no acute or suspicious osseous abnormalities. Facet arthropathy in the lumbar spine. Mild degenerative disc disease. Incidental 3 cm intramuscular lipoma within the right external oblique muscle. Review of the MIP images confirms the above findings. IMPRESSION: 1. No evidence of acute aortic abnormality. Mild thoracoabdominal aortic atherosclerosis. 2. Mild multi chamber cardiomegaly. 3. No acute abnormality in the chest, abdomen, or pelvis. Electronically Signed   By: Jeb Levering M.D.   On: 08/06/2017 21:49    Procedures Procedures (including critical care time)  Medications Ordered in ED Medications  nitroGLYCERIN (NITROSTAT) SL tablet 0.4 mg (0.4 mg Sublingual Given 08/06/17 2232)  potassium chloride 10 mEq in 100 mL IVPB (10 mEq Intravenous New Bag/Given 08/06/17 2216)  morphine 4 MG/ML injection 4 mg  (4 mg Intravenous Refused 08/06/17 2221)  ondansetron (ZOFRAN) injection 4 mg (4 mg Intravenous Refused 08/06/17 2221)  amLODipine (NORVASC) tablet 10 mg (not administered)  hydrALAZINE (APRESOLINE) tablet 50 mg (not administered)  hydrochlorothiazide (HYDRODIURIL) tablet 25 mg (not administered)  metoprolol tartrate (LOPRESSOR) tablet 50 mg (not administered)  potassium chloride (K-DUR) CR tablet 10 mEq (not administered)  aspirin EC tablet 81 mg (not administered)  nitroGLYCERIN (NITROSTAT) SL tablet 0.4 mg (not administered)  acetaminophen (TYLENOL) tablet 650 mg (not administered)  ondansetron (ZOFRAN) injection 4 mg (not administered)  enoxaparin (LOVENOX) injection 40 mg (not administered)  atorvastatin (LIPITOR) tablet 40 mg (not administered)  morphine 2 MG/ML injection 2-4 mg (not administered)  magnesium sulfate IVPB 1 g 100 mL (not administered)  0.9 % NaCl with KCl 20 mEq/ L  infusion (not administered)  labetalol (NORMODYNE,TRANDATE) injection 10 mg (not administered)  labetalol (NORMODYNE,TRANDATE) injection 20 mg (20 mg Intravenous Given 08/06/17 2101)  iopamidol (ISOVUE-370) 76 % injection 100 mL (100 mLs Intravenous Contrast Given 08/06/17 2038)  LORazepam (ATIVAN) injection 0.5 mg (0.5 mg Intravenous Given 08/06/17 2107)  potassium chloride SA (K-DUR,KLOR-CON) CR tablet 40 mEq (40 mEq Oral Given 08/06/17 2217)  aspirin chewable tablet 324 mg (324 mg Oral Given 08/06/17 2216)  0.9 %  sodium chloride infusion ( Intravenous New Bag/Given 08/06/17 2233)  acetaminophen (TYLENOL) tablet 1,000 mg (1,000 mg Oral Given 08/06/17 2232)     Initial Impression / Assessment and Plan / ED Course  I have reviewed the triage vital signs and the nursing notes.  Pertinent labs & imaging results that were available during my care of the patient were reviewed by me and considered in my medical decision making (see chart for details).  Clinical Course as of Aug 07 2243  Thu Aug 06, 2017  2215  Discussed CT results with patient. Pt states her chest pain has improved and states it is now "minor." States she now has a moderate, generalized, throbbing headache following the NTG.  [SJ]  2235 Spoke with Dr. Myna Hidalgo, hospitalist, who agreed to admit the patient.   [SJ]    Clinical Course User Index [SJ] Letricia Krinsky C, PA-C    Patient presents with chest pain and significant hypertension. HEART score of 5.  Presentation gives suspicion for aortic dissection. No dissection noted on CT. We were able to reduce her chest pain and blood pressure. Hypokalemia also addressed. No onset of additional symptoms. Admission for further evaluation and management.    Findings and plan of care discussed with Dorie Rank,  MD. Dr. Tomi Bamberger personally evaluated and examined this patient.   Vitals:   08/06/17 1638 08/06/17 1934 08/06/17 2030 08/06/17 2230  BP: (!) 182/105 (!) 205/107 (!) 194/120 116/85  Pulse: 97 (!) 111 (!) 104 82  Resp: 18 20 (!) 23 18  Temp: 98.7 F (37.1 C)     TempSrc: Oral     SpO2: 100% 100% 100% 100%  Weight:      Height:         Final Clinical Impressions(s) / ED Diagnoses   Final diagnoses:  Chest pain, unspecified type  Hypokalemia  Hypertension, unspecified type    New Prescriptions New Prescriptions   No medications on file     Layla Maw 08/06/17 2246    Dorie Rank, MD 08/08/17 [phone]

## 2017-08-06 NOTE — Progress Notes (Signed)
Rn may call report to [place] at [phone]

## 2017-08-07 ENCOUNTER — Other Ambulatory Visit: Payer: Self-pay

## 2017-08-07 DIAGNOSIS — R778 Other specified abnormalities of plasma proteins: Secondary | ICD-10-CM

## 2017-08-07 DIAGNOSIS — Z720 Tobacco use: Secondary | ICD-10-CM

## 2017-08-07 DIAGNOSIS — R7989 Other specified abnormal findings of blood chemistry: Secondary | ICD-10-CM

## 2017-08-07 DIAGNOSIS — R072 Precordial pain: Secondary | ICD-10-CM

## 2017-08-07 DIAGNOSIS — I10 Essential (primary) hypertension: Secondary | ICD-10-CM

## 2017-08-07 DIAGNOSIS — R748 Abnormal levels of other serum enzymes: Secondary | ICD-10-CM

## 2017-08-07 LAB — BASIC METABOLIC PANEL
Anion gap: 8 (ref 5–15)
BUN: 15 mg/dL (ref 6–20)
CHLORIDE: 102 mmol/L (ref 101–111)
CO2: 28 mmol/L (ref 22–32)
CREATININE: 0.98 mg/dL (ref 0.44–1.00)
Calcium: 8.5 mg/dL — ABNORMAL LOW (ref 8.9–10.3)
GFR calc Af Amer: >60 mL/min (ref >60)
GFR calc non Af Amer: >60 mL/min (ref >60)
GLUCOSE: 111 mg/dL — AB (ref 65–99)
POTASSIUM: 3.5 mmol/L (ref 3.5–5.1)
Sodium: 138 mmol/L (ref 135–145)

## 2017-08-07 LAB — CBC
HEMATOCRIT: 36.6 % (ref 36.0–46.0)
Hemoglobin: 12.2 g/dL (ref 12.0–15.0)
MCH: 28 pg (ref 26.0–34.0)
MCHC: 33.3 g/dL (ref 30.0–36.0)
MCV: 83.9 fL (ref 78.0–100.0)
PLATELETS: 281 10*3/uL (ref 150–400)
RBC: 4.36 MIL/uL (ref 3.87–5.11)
RDW: 15.2 % (ref 11.5–15.5)
WBC: 7.6 10*3/uL (ref 4.0–10.5)

## 2017-08-07 LAB — TROPONIN I
TROPONIN I: 0.05 ng/mL — AB (ref <0.03)
Troponin I: 0.05 ng/mL (ref <0.03)
Troponin I: 0.04 ng/mL (ref <0.03)
Troponin I: 0.04 ng/mL (ref <0.03)
Troponin I: 0.03 ng/mL (ref <0.03)

## 2017-08-07 LAB — HIV ANTIBODY (ROUTINE TESTING W REFLEX): HIV SCREEN 4TH GENERATION: NONREACTIVE

## 2017-08-07 MED ORDER — MORPHINE SULFATE (PF) 4 MG/ML IV SOLN: 2.0000 mg | INTRAVENOUS | Status: DC | PRN

## 2017-08-07 NOTE — Progress Notes (Addendum)
[facility] set up to pick patient up at 730am to be at [facility] by 8.  [facility] med [facility].  Patient also aware of plan.

## 2017-08-07 NOTE — Progress Notes (Signed)
CRITICAL VALUE ALERT  Critical Value: troponin 0.05  Date & Time Notied: 08/07/17 0241  Provider Notified: Oypd  Orders Received/Actions taken: No new orders given

## 2017-08-07 NOTE — Progress Notes (Signed)
  PROGRESS NOTE  Tracie Estrada EZM:[id number] DOB: May 09, 1966 DOA: 08/06/2017 PCP: Tresa Garter, MD  Brief Narrative: 51 year old woman with history of accelerated hypertension presented with acute onset of chest pain. Was found to be markedly hypertensive and referred for further evaluation of chest pain.  Assessment/Plan Chest pain with mild elevation of troponin. EKG nonacute. -Appreciate cardiology evaluation, plans for echocardiogram and nuclear stress test 9/15. -Continue aspirin, Lipitor.  Malignant hypertension -Improved. Continue Norvasc, hydralazine, Lopressor, hydrochlorothiazide.  Acute kidney injury. -Resolved with IV fluids.  Hematuria -Significance unclear. Repeat urinalysis.    DVT prophylaxis: Enoxaparin  Code Status: full Family Communication: none Disposition Plan: home    Murray Hodgkins, MD  [facility] Direct contact: [phone] --[place] [place]  --[email or web address]; password [email or web address]  7PM-7AM contact night coverage as above 08/07/2017, 5:20 PM  LOS: 0 days   Consultants:  Cardiology   Procedures:    Antimicrobials:    Interval history/Subjective: Feels much better today. Did have some chest pain earlier but insignificant compared to yesterday.  Objective: Vitals: Afebrile, 98.2, 18, 64, 168/95, 100% on room air  Exam:     Constitutional: Appears calm, comfortable.  Respiratory. Clear to auscultation bilaterally. No wheezes, rales or rhonchi. Normal respiratory effort.  Cardiovascular. Regular rate and rhythm. No murmur, rub or gallop. No lower extremity edema. Telemetry sinus rhythm  Psychiatric. Grossly normal mood and affect. Speech fluent and appropriate.   I have personally reviewed the following:   Labs:  Troponins 0.05, 0.04, flat  Basic metabolic panel unremarkable  CBC unremarkable  Imaging studies:  CT chest abdomen and pelvis, no acute abnormalities noted.   Medical tests: EKG normal sinus rhythm  with LVH with repolarization abnormality, no significant change compared to previous studies   Scheduled Meds: . amLODipine  10 mg Oral Daily  . aspirin EC  81 mg Oral Daily  . atorvastatin  40 mg Oral q1800  . enoxaparin (LOVENOX) injection  40 mg Subcutaneous QHS  . hydrALAZINE  50 mg Oral Q8H  . hydrochlorothiazide  25 mg Oral Daily  . metoprolol tartrate  50 mg Oral BID  .  morphine injection  4 mg Intravenous Once  . ondansetron (ZOFRAN) IV  4 mg Intravenous Once  . potassium chloride  10 mEq Oral Daily   Continuous Infusions:  Principal Problem:   Chest pain Active Problems:   Hypokalemia   Accelerated hypertension   Hypertensive urgency   AKI (acute kidney injury) (HCC)   Elevated troponin   Tobacco abuse   LOS: 0 days

## 2017-08-07 NOTE — Consult Note (Signed)
Cardiology Consultation:   Patient ID: Tracie Estrada; [id number]; March 02, 1966   Admit date: 08/06/2017 Date of Consult: 08/07/2017  Primary Care Provider: Tresa Garter, MD Primary Cardiologist: New   Patient Profile:   Tracie Estrada is a 51 y.o. female with a hx of HTN, obesity and tobacco abuse who is being seen today for the evaluation of chest pain at the request of Dr. Sarajane Jews.  History of Present Illness:   Tracie Estrada a 51 yo female with history of HTN, tobacco abuse and obesity who was admitted to [facility] 08/06/17 with c/o chest pain and was found to have severely elevated BP (210/130). EKG without ischemic changes. Chest pain resolved with treatment of hypertension. Troponin with very subtle elevation at 0.05 with flat trend down to 0.04.  Tracie Estrada reports continued mild chest pressure x 24 hours. No dyspnea. No LE edema. No near syncope or syncope. Her chest pain began at home while cooking. Tracie Estrada does not typically have chest pain.   Past Medical History:  Diagnosis Date  . Acute renal failure ([place]) 10/13/2013   On Vancomycin  . Breast abscess of female    Status post biopsy  . Hypertension   . Malignant hypertension 10/13/2013    Past Surgical History:  Procedure Laterality Date  . Biopsy of left breast    . INCISION AND DRAINAGE ABSCESS Left 10/11/2013   Procedure: INCISION AND DRAINAGE and debridement LEFT BREAST ABSCESS;  Surgeon: Odis Hollingshead, MD;  Location: [facility];  Service: General;  Laterality: Left;    Inpatient Medications: Scheduled Meds: . amLODipine  10 mg Oral Daily  . aspirin EC  81 mg Oral Daily  . atorvastatin  40 mg Oral q1800  . enoxaparin (LOVENOX) injection  40 mg Subcutaneous QHS  . hydrALAZINE  50 mg Oral Q8H  . hydrochlorothiazide  25 mg Oral Daily  . metoprolol tartrate  50 mg Oral BID  .  morphine injection  4 mg Intravenous Once  . ondansetron (ZOFRAN) IV  4 mg Intravenous Once  . potassium chloride  10 mEq Oral Daily    Continuous Infusions:  PRN Meds: acetaminophen, labetalol, morphine injection, nitroGLYCERIN, ondansetron (ZOFRAN) IV  Allergies:    Allergies  Allergen Reactions  . Fish Allergy Anaphylaxis  . Peanuts [Peanut Oil] Anaphylaxis  . Other Other (See Comments)    Anesthesia.  Specific agent unknown.  Reaction unknown.   Marland Kitchen Penicillins Itching  . Vancomycin Other (See Comments)    Nephrotoxicity    Social History:   Social History   Social History  . Marital status: Single    Spouse name: N/A  . Number of children: N/A  . Years of education: N/A   Occupational History  . Not on file.   Social History Main Topics  . Smoking status: Current Every Day Smoker    Packs/day: 1.00    Years: 26.00    Types: Cigarettes  . Smokeless tobacco: Never Used  . Alcohol use No  . Drug use: No  . Sexual activity: No   Other Topics Concern  . Not on file   Social History Narrative   Single.  Lives with daugher and grandchildren.    Family History:    Family History  Problem Relation Age of Onset  . Breast cancer Mother   . Cancer Mother        brain  . Hypertension Father   . Thyroid disease Daughter      ROS:  Please  see the history of present illness.  ROS  All other ROS reviewed and negative.     Physical Exam/Data:   Vitals:   08/07/17 0017 08/07/17 0027 08/07/17 0648 08/07/17 1427  BP: (!) 172/88  (!) 167/105 (!) 168/95  Pulse: 77  74 64  Resp: 20  18 18   Temp: 97.9 F (36.6 C)  97.7 F (36.5 C) 98.2 F (36.8 C)  TempSrc: Oral  Oral Oral  SpO2: 100%  98% 100%  Weight:      Height:  4\' 11"  (1.499 m)      Intake/Output Summary (Last 24 hours) at 08/07/17 1455 Last data filed at 08/07/17 0200  Gross per 24 hour  Intake           449.58 ml  Output                0 ml  Net           449.58 ml   Filed Weights   08/06/17 1428  Weight: 190 lb (86.2 kg)   Body mass index is 38.38 kg/m.  General:  Well nourished, well developed, in no acute  distress HEENT: normal Lymph: no adenopathy Neck: no JVD Endocrine:  No thryomegaly Vascular: No carotid bruits; FA pulses 2+ bilaterally without bruits  Cardiac:  normal S1, S2; RRR; soft systolic murmur  Lungs:  clear to auscultation bilaterally, no wheezing, rhonchi or rales  Abd: soft, nontender, no hepatomegaly  Ext: no edema Musculoskeletal:  No deformities, BUE and BLE strength normal and equal Skin: warm and dry  Neuro:  CNs 2-12 intact, no focal abnormalities noted Psych:  Normal affect   EKG:  The EKG was personally reviewed and demonstrates:  Sinus rhythm with LVH. Unchanged from EKG in 2017 Telemetry:  Telemetry was personally reviewed and demonstrates:  Sinus rhythm  Relevant CV Studies:   Laboratory Data:  Chemistry Recent Labs Lab 08/06/17 1613 08/07/17 0632  NA 138 138  K 2.9* 3.5  CL 98* 102  CO2 31 28  GLUCOSE 123* 111*  BUN 17 15  CREATININE 1.40* 0.98  CALCIUM 9.1 8.5*  GFRNONAA 42* >60  GFRAA 49* >60  ANIONGAP 9 8    No results for input(s): PROT, ALBUMIN, AST, ALT, ALKPHOS, BILITOT in the last 168 hours. Hematology Recent Labs Lab 08/06/17 1613 08/07/17 0632  WBC 11.0* 7.6  RBC 4.80 4.36  HGB 13.7 12.2  HCT 40.3 36.6  MCV 84.0 83.9  MCH 28.5 28.0  MCHC 34.0 33.3  RDW 15.4 15.2  PLT 321 281   Cardiac Enzymes Recent Labs Lab 08/07/17 0033 08/07/17 0632 08/07/17 1158 08/07/17 1345  TROPONINI 0.05* 0.05* 0.04* 0.04*    Recent Labs Lab 08/06/17 1623  TROPIPOC 0.04    BNPNo results for input(s): BNP, PROBNP in the last 168 hours.  DDimer No results for input(s): DDIMER in the last 168 hours.  Radiology/Studies:  Dg Chest 2 View  Result Date: 08/06/2017 CLINICAL DATA:  Generalized chest pain associated with shortness of breath. History of hypertension, current smoker. EXAM: CHEST  2 VIEW COMPARISON:  Portable chest x-ray of November 22, 2016 FINDINGS: The lungs are adequately inflated. There is no focal infiltrate. There is no  pleural effusion. The heart is top-normal in size but stable. The pulmonary vascularity is normal. The mediastinum is normal in width. The bony thorax is unremarkable. IMPRESSION: Borderline cardiomegaly less conspicuous than on the previous study. No pulmonary vascular congestion nor other acute cardiopulmonary abnormality. Electronically Signed  By: David  Martinique M.D.   On: 08/06/2017 15:42   Ct Angio Chest/abd/pel For Dissection W And/or Wo Contrast  Result Date: 08/06/2017 CLINICAL DATA:  Chest and back pain. Clinical concern for aortic dissection. EXAM: CT ANGIOGRAPHY CHEST, ABDOMEN AND PELVIS TECHNIQUE: Multidetector CT imaging through the chest, abdomen and pelvis was performed using the standard protocol during bolus administration of intravenous contrast. Multiplanar reconstructed images and MIPs were obtained and reviewed to evaluate the vascular anatomy. CONTRAST:  100 cc Isovue 370 IV COMPARISON:  Chest radiograph earlier this day. FINDINGS: CTA CHEST FINDINGS Cardiovascular: There is mild motion artifact through the ascending aorta. No aortic dissection or aneurysm. Mild noncalcified plaque about the descending aorta. No evidence of aortic hematoma. There is multi chamber cardiomegaly. No central pulmonary embolus to the lobar level. No pericardial effusion. Mediastinum/Nodes: No mediastinal or hilar adenopathy. No axillary adenopathy. The esophagus is decompressed. No visualized thyroid nodule. Lungs/Pleura: Mild hypoventilatory change at the lung bases. No consolidation, pulmonary edema or pleural effusion. Musculoskeletal: Minimal degenerative disc disease in the spine. Subchondral cysts in both shoulders. There are no acute or suspicious osseous abnormalities. Review of the MIP images confirms the above findings. CTA ABDOMEN AND PELVIS FINDINGS VASCULAR Aorta: Suboptimal contrast timing bolus. Contrast bolus timing diagnostic to exclude aortic dissection or aneurysm. There is mild  atherosclerosis. Celiac: Patent without evidence of aneurysm, dissection, vasculitis or significant stenosis. SMA: Patent without evidence of aneurysm, dissection, vasculitis or significant stenosis. Renals: Both renal arteries are patent without evidence of aneurysm, dissection, vasculitis, fibromuscular dysplasia or significant stenosis. IMA: Patent without evidence of aneurysm, dissection, vasculitis or significant stenosis. Inflow: Patent without evidence of aneurysm, dissection, vasculitis or significant stenosis. Contrast bolus timing is suboptimal for more detailed assessment. Veins: No obvious venous abnormality within the limitations of this arterial phase study. Mixing of contrast in the superior mesenteric and portal vein. Review of the MIP images confirms the above findings. NON-VASCULAR Hepatobiliary: No focal liver abnormality is seen. No gallstones, gallbladder wall thickening, or biliary dilatation. Pancreas: No ductal dilatation or inflammation. Spleen: Normal in size without focal abnormality. Adrenals/Urinary Tract: Normal adrenal glands. No hydronephrosis or perinephric edema. Ureters are decompressed. Urinary bladder is physiologically distended. No bladder wall thickening. Stomach/Bowel: Stomach is within normal limits. Appendix appears normal. No evidence of bowel wall thickening, distention, or inflammatory changes. Lymphatic: No adenopathy. Reproductive: Uterus and bilateral adnexa are unremarkable. Other: No free air, free fluid, or intra-abdominal fluid collection. Tiny fat containing umbilical hernia. Musculoskeletal: There are no acute or suspicious osseous abnormalities. Facet arthropathy in the lumbar spine. Mild degenerative disc disease. Incidental 3 cm intramuscular lipoma within the right external oblique muscle. Review of the MIP images confirms the above findings. IMPRESSION: 1. No evidence of acute aortic abnormality. Mild thoracoabdominal aortic atherosclerosis. 2. Mild multi  chamber cardiomegaly. 3. No acute abnormality in the chest, abdomen, or pelvis. Electronically Signed   By: Jeb Levering M.D.   On: 08/06/2017 21:49    Assessment and Plan:   1. Chest pain: Her chest pain occurred in the setting of severely elevated HTN. Initial BP 210/130. Evidence of renal dysfunction and subtle troponin elevation. Chest pain improved with treatment of her BP but still mild chest pain. Tracie Estrada appears comfortable. . Tracie Estrada has risk factors for CAD including tobacco abuse and uncontrolled HTN.  -I would arrange an echo to assess LV size and function given her EKG findings of LVH.  - I would arrange an exercise nuclear stress test in the am  to exclude ischemia.   We will follow up tomorrow.   2. HTN: Tracie Estrada needs better BP control. Primary team managing this.   3. Tobacco abuse: smoking cessation recommended.    For questions or updates, please contact [place] Please consult [email or web address] for contact info under Cardiology/STEMI.   Signed, Lauree Chandler, MD  08/07/2017 2:55 PM

## 2017-08-08 ENCOUNTER — Inpatient Hospital Stay (HOSPITAL_COMMUNITY): Payer: Self-pay

## 2017-08-08 ENCOUNTER — Other Ambulatory Visit: Payer: Self-pay

## 2017-08-08 ENCOUNTER — Ambulatory Visit (HOSPITAL_BASED_OUTPATIENT_CLINIC_OR_DEPARTMENT_OTHER)
Admit: 2017-08-08 | Discharge: 2017-08-08 | Disposition: A | Discharge status: Home / Self Care | Payer: Self-pay | Attending: Physician Assistant | Admitting: Physician Assistant

## 2017-08-08 DIAGNOSIS — I340 Nonrheumatic mitral (valve) insufficiency: Secondary | ICD-10-CM

## 2017-08-08 DIAGNOSIS — R079 Chest pain, unspecified: Secondary | ICD-10-CM

## 2017-08-08 LAB — ECHOCARDIOGRAM COMPLETE
HEIGHTINCHES: 59 in
Weight: 3040 oz

## 2017-08-08 LAB — TROPONIN I: TROPONIN I: 0.03 ng/mL — AB (ref <0.03)

## 2017-08-08 LAB — NM MYOCAR MULTI W/SPECT W/WALL MOTION / EF
Peak HR: 103 {beats}/min
Rest HR: 62 {beats}/min

## 2017-08-08 MED ORDER — HYDRALAZINE HCL 20 MG/ML IJ SOLN
10.0000 mg | Freq: Four times a day (QID) | INTRAMUSCULAR | Status: DC | PRN
  Administered 2017-08-08: 10 mg via INTRAVENOUS
  Filled 2017-08-08: qty 1

## 2017-08-08 MED ORDER — ALPRAZOLAM 0.25 MG PO TABS
ORAL_TABLET | ORAL | Status: AC
  Administered 2017-08-08: 0.5 mg
  Filled 2017-08-08: qty 2

## 2017-08-08 MED ORDER — ALPRAZOLAM 0.5 MG PO TABS: 0.5000 mg | ORAL_TABLET | Freq: Once | ORAL | Status: DC

## 2017-08-08 MED ORDER — HYDRALAZINE HCL 50 MG PO TABS
100.0000 mg | ORAL_TABLET | Freq: Three times a day (TID) | ORAL | Status: DC
  Administered 2017-08-08 – 2017-08-11 (×8): 100 mg via ORAL
  Filled 2017-08-08 (×8): qty 2

## 2017-08-08 MED ORDER — HYDRALAZINE HCL 25 MG PO TABS
25.0000 mg | ORAL_TABLET | Freq: Once | ORAL | Status: DC
  Filled 2017-08-08: qty 1

## 2017-08-08 MED ORDER — HYDRALAZINE HCL 50 MG PO TABS: 75.0000 mg | ORAL_TABLET | Freq: Three times a day (TID) | ORAL | Status: DC

## 2017-08-08 MED ORDER — TECHNETIUM TC 99M TETROFOSMIN IV KIT
10.0000 | PACK | Freq: Once | INTRAVENOUS | Status: AC | PRN
  Administered 2017-08-08: 10 via INTRAVENOUS

## 2017-08-08 MED ORDER — TECHNETIUM TC 99M TETROFOSMIN IV KIT
30.0000 | PACK | Freq: Once | INTRAVENOUS | Status: AC | PRN
  Administered 2017-08-08: 30 via INTRAVENOUS

## 2017-08-08 MED ORDER — ALPRAZOLAM 0.25 MG PO TABS: 0.5000 mg | ORAL_TABLET | Freq: Once | ORAL | Status: DC

## 2017-08-08 MED ORDER — HYDRALAZINE HCL 25 MG PO TABS: 25.0000 mg | ORAL_TABLET | Freq: Once | ORAL | Status: DC

## 2017-08-08 MED ORDER — REGADENOSON 0.4 MG/5ML IV SOLN
0.4000 mg | Freq: Once | INTRAVENOUS | Status: AC
  Administered 2017-08-08: 0.4 mg via INTRAVENOUS

## 2017-08-08 MED ORDER — REGADENOSON 0.4 MG/5ML IV SOLN
INTRAVENOUS | Status: AC
  Filled 2017-08-08: qty 5

## 2017-08-08 MED ORDER — METOPROLOL TARTRATE 50 MG PO TABS
50.0000 mg | ORAL_TABLET | Freq: Once | ORAL | Status: DC
  Filled 2017-08-08: qty 1

## 2017-08-08 NOTE — Progress Notes (Signed)
Patient admitted with chest pain in the setting of severe HTN with SBPs above 200s. Mild fairly flat troponin, EKG SR with LVH and likely strain pattern vs lateral ischemia. Echo pending, nuclear stress is pending. Further cardiac recs pending echo and stress test results. She remains hypertensive. Currently on norvasc 10, hydral 50 tid, lopressor 50mg  bid. Diuretic on hold since admittd with hypokalemia and AKI. Increase hydral to 75mg  tid, may change lopressor to labetalol or coreg for more potent bp effect pending bp response and echo results.   Carlyle Dolly MD

## 2017-08-08 NOTE — Progress Notes (Signed)
Pt b/p is trending down. Spoke with patient in regards to fiet and compliance with medication. Discussed life style changes. Pt was very tearful but verbalized understanding of importance of making changes. Discussed NPO status and possible cath on Monday.  Initially she was adamant that she was not going to do it. I explained the importance of getting the cath. Referenced her grandchildren and daughter that lives with her. She became tearful again and verbalized understanding of taking medication. Dietary changes for a healthier lifestyle so that she can be here for her family.  Instructed  to call if she has chest pain. Pt has not had any s/sx related to ongoing dx. Informed that Cardiology will be here tomorrow to further discuss treatment options.   Cont with plan of care

## 2017-08-08 NOTE — Progress Notes (Signed)
Abnormal nuclear stress test, intermediate risk. Echo pending. Will plan for cath on Monday. With mild flat troponin would not start anticoag at this time, if recurrent chest pain could start heparin drip at that time.   Carlyle Dolly MD

## 2017-08-08 NOTE — Progress Notes (Signed)
Pt back from [facility] [place]. Pt report nurse was very rude to her. Stated she felt it was racially motivated Upon arrival bp 186/101  MD on the unit and addressing BP needs and adjusting medicine  Recd call from [facility] stated that she had an order to give patients her morning medicine while at [facility] for the Stress test Informed RN that the patient already had her meds and only needed Lopressor dose.

## 2017-08-08 NOTE — Progress Notes (Addendum)
Patient ID: Tracie Estrada, female   DOB: 1966-04-09, 51 y.o.   MRN: [id number]                                                                PROGRESS NOTE                                                                                                                                                                                                             Patient Demographics:    Tracie Estrada, is a 51 y.o. female, DOB - 10/14/1966, DXA:[id number]  Admit date - 08/06/2017   Admitting Physician Vianne Bulls, MD  Outpatient Primary MD for the patient is Tresa Garter, MD  LOS - 1  Outpatient Specialists:     Chief Complaint  Patient presents with  . Chest Pain       Brief Narrative  51 year old woman with history of accelerated hypertension presented with acute onset of chest pain. Was found to be markedly hypertensive and referred for further evaluation of chest pain.   Subjective:    Tracie Estrada today has no further cp.  Pt had stress test intermediate risk.     No headache, No chest pain, No abdominal pain - No Nausea, No new weakness tingling or numbness, No Cough - SOB.    Assessment  & Plan :    Principal Problem:   Chest pain Active Problems:   Accelerated hypertension   Hypertensive urgency   AKI (acute kidney injury) (HCC)   Elevated troponin   Tobacco abuse   Chest pain with mild elevation of troponin.  EKG nonacute. Stress test intermediate risk Continue aspirin, Lipitor Cardiology input appreciated.  Malignant hypertension -Improving  Continue Norvasc, hydralazine, Lopressor, hydrochlorothiazide. Will titrate medication  Acute kidney injury. -Resolved with IV fluids.  Hematuria -Significance unclear Repeat ua , if still + then  Check CT scan abd/ pelvis  Glucose intolerance   DVT prophylaxis: Enoxaparin  Code Status: full Family Communication: none Disposition Plan: home     Consults  :  Cardiolgy  Procedures  :  Nuclear stress  test 9/15=> intermediate risk  DVT Prophylaxis  :  Lovenox - SCDs   Lab Results  Component Value Date   PLT 281 08/07/2017    Antibiotics  :  none  Anti-infectives    None        Objective:   Vitals:   08/07/17 1427 08/07/17 2024 08/08/17 0519 08/08/17 0803  BP: (!) 168/95 (!) 193/102 (!) 174/109 (!) 180/105  Pulse: 64 75 65   Resp: 18 18 18    Temp: 98.2 F (36.8 C) 98.2 F (36.8 C) 98.7 F (37.1 C)   TempSrc: Oral Oral Oral   SpO2: 100% 100% 100%   Weight:      Height:        Wt Readings from Last 3 Encounters:  08/06/17 86.2 kg (190 lb)  07/29/17 86.2 kg (190 lb)  04/28/17 86.5 kg (190 lb 9.6 oz)     Intake/Output Summary (Last 24 hours) at 08/08/17 1114 Last data filed at 08/07/17 1930  Gross per 24 hour  Intake              480 ml  Output                0 ml  Net              480 ml     Physical Exam  Awake Alert, Oriented X 3, No new F.N deficits, Normal affect [place].AT,PERRAL Supple Neck,No JVD, No cervical lymphadenopathy appriciated.  Symmetrical Chest wall movement, Good air movement bilaterally, CTAB RRR,No Gallops,Rubs or new Murmurs, No Parasternal Heave +ve B.Sounds, Abd Soft, No tenderness, No organomegaly appriciated, No rebound - guarding or rigidity. No Cyanosis, Clubbing or edema, No new Rash or bruise      Data Review:    CBC  Recent Labs Lab 08/06/17 1613 08/07/17 0632  WBC 11.0* 7.6  HGB 13.7 12.2  HCT 40.3 36.6  PLT 321 281  MCV 84.0 83.9  MCH 28.5 28.0  MCHC 34.0 33.3  RDW 15.4 15.2    Chemistries   Recent Labs Lab 08/06/17 1613 08/07/17 0632  NA 138 138  K 2.9* 3.5  CL 98* 102  CO2 31 28  GLUCOSE 123* 111*  BUN 17 15  CREATININE 1.40* 0.98  CALCIUM 9.1 8.5*   ------------------------------------------------------------------------------------------------------------------ No results for input(s): CHOL, HDL, LDLCALC, TRIG, CHOLHDL, LDLDIRECT in the last 72 hours.  Lab Results  Component Value Date    HGBA1C (H) 07/07/2010    5.8 (NOTE)                                                                       According to the ADA Clinical Practice Recommendations for 2011, when HbA1c is used as a screening test:   >=6.5%   Diagnostic of Diabetes Mellitus           (if abnormal result  is confirmed)  5.7-6.4%   Increased risk of developing Diabetes Mellitus  References:Diagnosis and Classification of Diabetes Mellitus,Diabetes Care,2011,34(Suppl 1):S62-S69 and Standards of Medical Care in         Diabetes - 2011,Diabetes MEQA,8341,96  (Suppl 1):S11-S61.   ------------------------------------------------------------------------------------------------------------------ No results for input(s): TSH, T4TOTAL, T3FREE, THYROIDAB in the last 72 hours.  Invalid input(s): FREET3 ------------------------------------------------------------------------------------------------------------------ No results for input(s): VITAMINB12, FOLATE, FERRITIN, TIBC, IRON, RETICCTPCT in the last 72 hours.  Coagulation profile No results for input(s): INR, PROTIME in the last 168 hours.  No results for input(s):  DDIMER in the last 72 hours.  Cardiac Enzymes  Recent Labs Lab 08/07/17 1345 08/07/17 1847 08/08/17 0054  TROPONINI 0.04* 0.03* 0.03*   ------------------------------------------------------------------------------------------------------------------ No results found for: BNP  Inpatient Medications  Scheduled Meds: . amLODipine  10 mg Oral Daily  . aspirin EC  81 mg Oral Daily  . atorvastatin  40 mg Oral q1800  . enoxaparin (LOVENOX) injection  40 mg Subcutaneous QHS  . hydrALAZINE  25 mg Oral Once  . hydrALAZINE  75 mg Oral Q8H  . hydrochlorothiazide  25 mg Oral Daily  . metoprolol tartrate  50 mg Oral BID  . ondansetron (ZOFRAN) IV  4 mg Intravenous Once  . potassium chloride  10 mEq Oral Daily   Continuous Infusions: PRN Meds:.acetaminophen, labetalol, morphine injection, nitroGLYCERIN,  ondansetron (ZOFRAN) IV  Micro Results No results found for this or any previous visit (from the past 240 hour(s)).  Radiology Reports Dg Chest 2 View  Result Date: 08/06/2017 CLINICAL DATA:  Generalized chest pain associated with shortness of breath. History of hypertension, current smoker. EXAM: CHEST  2 VIEW COMPARISON:  Portable chest x-ray of November 22, 2016 FINDINGS: The lungs are adequately inflated. There is no focal infiltrate. There is no pleural effusion. The heart is top-normal in size but stable. The pulmonary vascularity is normal. The mediastinum is normal in width. The bony thorax is unremarkable. IMPRESSION: Borderline cardiomegaly less conspicuous than on the previous study. No pulmonary vascular congestion nor other acute cardiopulmonary abnormality. Electronically Signed   By: David  Martinique M.D.   On: 08/06/2017 15:42   Ct Angio Chest/abd/pel For Dissection W And/or Wo Contrast  Result Date: 08/06/2017 CLINICAL DATA:  Chest and back pain. Clinical concern for aortic dissection. EXAM: CT ANGIOGRAPHY CHEST, ABDOMEN AND PELVIS TECHNIQUE: Multidetector CT imaging through the chest, abdomen and pelvis was performed using the standard protocol during bolus administration of intravenous contrast. Multiplanar reconstructed images and MIPs were obtained and reviewed to evaluate the vascular anatomy. CONTRAST:  100 cc Isovue 370 IV COMPARISON:  Chest radiograph earlier this day. FINDINGS: CTA CHEST FINDINGS Cardiovascular: There is mild motion artifact through the ascending aorta. No aortic dissection or aneurysm. Mild noncalcified plaque about the descending aorta. No evidence of aortic hematoma. There is multi chamber cardiomegaly. No central pulmonary embolus to the lobar level. No pericardial effusion. Mediastinum/Nodes: No mediastinal or hilar adenopathy. No axillary adenopathy. The esophagus is decompressed. No visualized thyroid nodule. Lungs/Pleura: Mild hypoventilatory change at the  lung bases. No consolidation, pulmonary edema or pleural effusion. Musculoskeletal: Minimal degenerative disc disease in the spine. Subchondral cysts in both shoulders. There are no acute or suspicious osseous abnormalities. Review of the MIP images confirms the above findings. CTA ABDOMEN AND PELVIS FINDINGS VASCULAR Aorta: Suboptimal contrast timing bolus. Contrast bolus timing diagnostic to exclude aortic dissection or aneurysm. There is mild atherosclerosis. Celiac: Patent without evidence of aneurysm, dissection, vasculitis or significant stenosis. SMA: Patent without evidence of aneurysm, dissection, vasculitis or significant stenosis. Renals: Both renal arteries are patent without evidence of aneurysm, dissection, vasculitis, fibromuscular dysplasia or significant stenosis. IMA: Patent without evidence of aneurysm, dissection, vasculitis or significant stenosis. Inflow: Patent without evidence of aneurysm, dissection, vasculitis or significant stenosis. Contrast bolus timing is suboptimal for more detailed assessment. Veins: No obvious venous abnormality within the limitations of this arterial phase study. Mixing of contrast in the superior mesenteric and portal vein. Review of the MIP images confirms the above findings. NON-VASCULAR Hepatobiliary: No focal liver abnormality is seen. No gallstones,  gallbladder wall thickening, or biliary dilatation. Pancreas: No ductal dilatation or inflammation. Spleen: Normal in size without focal abnormality. Adrenals/Urinary Tract: Normal adrenal glands. No hydronephrosis or perinephric edema. Ureters are decompressed. Urinary bladder is physiologically distended. No bladder wall thickening. Stomach/Bowel: Stomach is within normal limits. Appendix appears normal. No evidence of bowel wall thickening, distention, or inflammatory changes. Lymphatic: No adenopathy. Reproductive: Uterus and bilateral adnexa are unremarkable. Other: No free air, free fluid, or intra-abdominal  fluid collection. Tiny fat containing umbilical hernia. Musculoskeletal: There are no acute or suspicious osseous abnormalities. Facet arthropathy in the lumbar spine. Mild degenerative disc disease. Incidental 3 cm intramuscular lipoma within the right external oblique muscle. Review of the MIP images confirms the above findings. IMPRESSION: 1. No evidence of acute aortic abnormality. Mild thoracoabdominal aortic atherosclerosis. 2. Mild multi chamber cardiomegaly. 3. No acute abnormality in the chest, abdomen, or pelvis. Electronically Signed   By: Jeb Levering M.D.   On: 08/06/2017 21:49    Time Spent in minutes  30   Jani Gravel M.D on 08/08/2017 at 11:14 AM  Between 7am to 7pm - Pager - [phone]  After 7pm go to [email or web address] - password [facility]  [facility] -  Office  [phone]

## 2017-08-08 NOTE — Progress Notes (Signed)
Report given to [facility]. Page to MD in regards to [facility]'s concern. Pt b/p 180/105 HR 62. Pt is asymptomatic with the pressure. Denies visual changes, chest pain at this time, no headache

## 2017-08-08 NOTE — Progress Notes (Signed)
*  PRELIMINARY RESULTS* Echocardiogram 2D Echocardiogram has been performed.  Tracie Estrada 08/08/2017, 2:35 PM

## 2017-08-08 NOTE — Progress Notes (Addendum)
   Tracie Estrada presented for a nuclear stress test today.  No immediate complications.  Stress imaging is pending at this time. Lexiscan chosen due to presenting HTN.  Preliminary EKG findings may be listed in the chart, but the stress test result will not be finalized until perfusion imaging is complete.  See Dr. Nelly Laurence note re: BP. She became increasingly HTN during test but was also very anxious (relaying prior history of mother, father passing away). BP came down closer to pre-test value after last check, symptoms stable. Did report chest tightness at peak BP that improved at end of test. I asked nuc med to give the 25mg  oral hydralazine that was ordered along with her AM metoprolol 50mg  while down in the department. Pt reports her baseline "normal" is always extremely high. We discussed while this may be her normal, it does not mean that she should accept this as how it should always be - longterm goal would be gradual lowering of BP.  Charlie Pitter, PA-C 08/08/2017, 11:04 AM

## 2017-08-08 NOTE — Progress Notes (Signed)
Pt to [facility] for a stress test with CL... Condition stable BP 180/106. Spoke with MD in regards to BP. Will give 10am  meds prior to transport.  Hold Lopressor per MD as it could alter stress testing. Med given. Cont with plan of care

## 2017-08-09 ENCOUNTER — Encounter (HOSPITAL_COMMUNITY): Payer: Self-pay

## 2017-08-09 LAB — COMPREHENSIVE METABOLIC PANEL
ALBUMIN: 3.5 g/dL (ref 3.5–5.0)
ALT: 12 U/L — ABNORMAL LOW (ref 14–54)
ANION GAP: 9 (ref 5–15)
AST: 15 U/L (ref 15–41)
Alkaline Phosphatase: 55 U/L (ref 38–126)
BUN: 16 mg/dL (ref 6–20)
CHLORIDE: 103 mmol/L (ref 101–111)
CO2: 24 mmol/L (ref 22–32)
Calcium: 8.7 mg/dL — ABNORMAL LOW (ref 8.9–10.3)
Creatinine, Ser: 0.86 mg/dL (ref 0.44–1.00)
GFR calc Af Amer: >60 mL/min (ref >60)
GFR calc non Af Amer: >60 mL/min (ref >60)
GLUCOSE: 104 mg/dL — AB (ref 65–99)
POTASSIUM: 3.6 mmol/L (ref 3.5–5.1)
SODIUM: 136 mmol/L (ref 135–145)
Total Bilirubin: 0.4 mg/dL (ref 0.3–1.2)
Total Protein: 6.9 g/dL (ref 6.5–8.1)

## 2017-08-09 LAB — CBC
HCT: 39.7 % (ref 36.0–46.0)
Hemoglobin: 13.9 g/dL (ref 12.0–15.0)
MCH: 28.7 pg (ref 26.0–34.0)
MCHC: 35 g/dL (ref 30.0–36.0)
MCV: 82 fL (ref 78.0–100.0)
Platelets: 308 10*3/uL (ref 150–400)
RBC: 4.84 MIL/uL (ref 3.87–5.11)
RDW: 15.2 % (ref 11.5–15.5)
WBC: 10.2 10*3/uL (ref 4.0–10.5)

## 2017-08-09 MED ORDER — MORPHINE SULFATE (PF) 4 MG/ML IV SOLN
2.0000 mg | INTRAVENOUS | Status: DC | PRN
  Administered 2017-08-09: 2 mg via INTRAVENOUS
  Filled 2017-08-09: qty 1

## 2017-08-09 MED ORDER — HYDROCODONE-ACETAMINOPHEN 5-325 MG PO TABS: 1.0000 | ORAL_TABLET | Freq: Four times a day (QID) | ORAL | Status: DC | PRN

## 2017-08-09 MED ORDER — CHLORTHALIDONE 25 MG PO TABS
25.0000 mg | ORAL_TABLET | Freq: Every day | ORAL | Status: DC
  Administered 2017-08-09 – 2017-08-11 (×3): 25 mg via ORAL
  Filled 2017-08-09 (×3): qty 1

## 2017-08-09 MED ORDER — DIPHENHYDRAMINE HCL 25 MG PO CAPS: 25.0000 mg | ORAL_CAPSULE | Freq: Every evening | ORAL | Status: DC | PRN

## 2017-08-09 NOTE — Progress Notes (Signed)
Patient ID: Tracie Estrada, female   DOB: 25-Aug-1966, 51 y.o.   MRN: [id number]                                                                PROGRESS NOTE                                                                                                                                                                                                             Patient Demographics:    Tracie Estrada, is a 51 y.o. female, DOB - [birth year], SEG:[id number]  Admit date - 08/06/2017   Admitting Physician Vianne Bulls, MD  Outpatient Primary MD for the patient is Tresa Garter, MD  LOS - 2  Outpatient Specialists:     Chief Complaint  Patient presents with  . Chest Pain       Brief Narrative    51 year old woman with history of accelerated hypertension presented with acute onset of chest pain. Was found to be markedly hypertensive and referred for further evaluation of chest pain.   Subjective:    Tracie Estrada today has no chest pain, no sob.  Stress test intermediate risk , awaiting catheterization tomorrow. NPO after mn  No headache, No abdominal pain - No Nausea, No new weakness tingling or numbness, No Cough -   Assessment  & Plan :    Principal Problem:   Chest pain Active Problems:   Accelerated hypertension   Hypertensive urgency   AKI (acute kidney injury) (HCC)   Elevated troponin   Tobacco abuse   Chest pain with mild elevation of troponin.  EKG nonacute. Stress test intermediate risk Continue aspirin, Lipitor Cardiology input appreciated. Cardiac cath 9/17 NPO after MN  Malignant hypertension -Improving  Continue Norvasc, hydralazine, Lopressor, hydrochlorothiazide. Agree with cardiology d/c hydrochlorothiazide and start chlorthalidone  Acute kidney injury. -Resolved with IV fluids.  Hematuria -Significance unclear Repeat ua , if still + then  Check CT scan abd/ pelvis  Glucose intolerance   DVT prophylaxis:Enoxaparin  Code  Status:full Family Communication:none Disposition Plan:home    Consults  :  Cardiolgy  Procedures  :  Nuclear stress test 9/15=> intermediate risk     Lab Results  Component Value Date   PLT 308 08/09/2017      Anti-infectives  None        Objective:   Vitals:   08/08/17 1400 08/08/17 1530 08/08/17 2011 08/09/17 0453  BP: (!) 152/100 (!) 142/69 (!) 177/97 (!) 169/86  Pulse:   81 64  Resp:   19 18  Temp:   98.4 F (36.9 C) 98.1 F (36.7 C)  TempSrc:   Oral Oral  SpO2:   100% 100%  Weight:      Height:        Wt Readings from Last 3 Encounters:  08/06/17 86.2 kg (190 lb)  07/29/17 86.2 kg (190 lb)  04/28/17 86.5 kg (190 lb 9.6 oz)    No intake or output data in the 24 hours ending 08/09/17 1042   Physical Exam  Awake Alert, Oriented X 3, No new F.N deficits, Normal affect [place].AT,PERRAL Supple Neck,No JVD, No cervical lymphadenopathy appriciated.  Symmetrical Chest wall movement, Good air movement bilaterally, CTAB RRR,No Gallops,Rubs or new Murmurs, No Parasternal Heave +ve B.Sounds, Abd Soft, No tenderness, No organomegaly appriciated, No rebound - guarding or rigidity. No Cyanosis, Clubbing or edema, No new Rash or bruise      Data Review:    CBC  Recent Labs Lab 08/06/17 1613 08/07/17 0632 08/09/17 0436  WBC 11.0* 7.6 10.2  HGB 13.7 12.2 13.9  HCT 40.3 36.6 39.7  PLT 321 281 308  MCV 84.0 83.9 82.0  MCH 28.5 28.0 28.7  MCHC 34.0 33.3 35.0  RDW 15.4 15.2 15.2    Chemistries   Recent Labs Lab 08/06/17 1613 08/07/17 0632 08/09/17 0436  NA 138 138 136  K 2.9* 3.5 3.6  CL 98* 102 103  CO2 31 28 24   GLUCOSE 123* 111* 104*  BUN 17 15 16   CREATININE 1.40* 0.98 0.86  CALCIUM 9.1 8.5* 8.7*  AST  --   --  15  ALT  --   --  12*  ALKPHOS  --   --  55  BILITOT  --   --  0.4   ------------------------------------------------------------------------------------------------------------------ No results for input(s):  CHOL, HDL, LDLCALC, TRIG, CHOLHDL, LDLDIRECT in the last 72 hours.  Lab Results  Component Value Date   HGBA1C (H) 07/07/2010    5.8 (NOTE)                                                                       According to the ADA Clinical Practice Recommendations for 2011, when HbA1c is used as a screening test:   >=6.5%   Diagnostic of Diabetes Mellitus           (if abnormal result  is confirmed)  5.7-6.4%   Increased risk of developing Diabetes Mellitus  References:Diagnosis and Classification of Diabetes Mellitus,Diabetes Care,2011,34(Suppl 1):S62-S69 and Standards of Medical Care in         Diabetes - 2011,Diabetes TDDU,2025,42  (Suppl 1):S11-S61.   ------------------------------------------------------------------------------------------------------------------ No results for input(s): TSH, T4TOTAL, T3FREE, THYROIDAB in the last 72 hours.  Invalid input(s): FREET3 ------------------------------------------------------------------------------------------------------------------ No results for input(s): VITAMINB12, FOLATE, FERRITIN, TIBC, IRON, RETICCTPCT in the last 72 hours.  Coagulation profile No results for input(s): INR, PROTIME in the last 168 hours.  No results for input(s): DDIMER in the last 72 hours.  Cardiac Enzymes  Recent Labs Lab 08/07/17  1345 08/07/17 1847 08/08/17 0054  TROPONINI 0.04* 0.03* 0.03*   ------------------------------------------------------------------------------------------------------------------ No results found for: BNP  Inpatient Medications  Scheduled Meds: . amLODipine  10 mg Oral Daily  . aspirin EC  81 mg Oral Daily  . atorvastatin  40 mg Oral q1800  . chlorthalidone  25 mg Oral Daily  . enoxaparin (LOVENOX) injection  40 mg Subcutaneous QHS  . hydrALAZINE  100 mg Oral Q8H  . metoprolol tartrate  50 mg Oral BID  . ondansetron (ZOFRAN) IV  4 mg Intravenous Once  . potassium chloride  10 mEq Oral Daily   Continuous  Infusions: PRN Meds:.acetaminophen, diphenhydrAMINE, hydrALAZINE, HYDROcodone-acetaminophen, labetalol, morphine injection, nitroGLYCERIN, ondansetron (ZOFRAN) IV  Micro Results No results found for this or any previous visit (from the past 240 hour(s)).  Radiology Reports Dg Chest 2 View  Result Date: 08/06/2017 CLINICAL DATA:  Generalized chest pain associated with shortness of breath. History of hypertension, current smoker. EXAM: CHEST  2 VIEW COMPARISON:  Portable chest x-ray of November 22, 2016 FINDINGS: The lungs are adequately inflated. There is no focal infiltrate. There is no pleural effusion. The heart is top-normal in size but stable. The pulmonary vascularity is normal. The mediastinum is normal in width. The bony thorax is unremarkable. IMPRESSION: Borderline cardiomegaly less conspicuous than on the previous study. No pulmonary vascular congestion nor other acute cardiopulmonary abnormality. Electronically Signed   By: David  Martinique M.D.   On: 08/06/2017 15:42   Nm Myocar Multi W/spect W/wall Motion / Ef  Result Date: 08/08/2017  There was no ST segment deviation noted during stress.  Defect 1: There is a medium defect of mild severity present in the basal inferolateral, mid inferolateral and apical lateral location.  Findings consistent with ischemia.  This is an intermediate risk study.  Nuclear stress EF: 44%.  Intermediate risk stress nuclear study with inferolateral ischemia and mildly depressed left ventricular systolic function.   Ct Angio Chest/abd/pel For Dissection W And/or Wo Contrast  Result Date: 08/06/2017 CLINICAL DATA:  Chest and back pain. Clinical concern for aortic dissection. EXAM: CT ANGIOGRAPHY CHEST, ABDOMEN AND PELVIS TECHNIQUE: Multidetector CT imaging through the chest, abdomen and pelvis was performed using the standard protocol during bolus administration of intravenous contrast. Multiplanar reconstructed images and MIPs were obtained and reviewed to  evaluate the vascular anatomy. CONTRAST:  100 cc Isovue 370 IV COMPARISON:  Chest radiograph earlier this day. FINDINGS: CTA CHEST FINDINGS Cardiovascular: There is mild motion artifact through the ascending aorta. No aortic dissection or aneurysm. Mild noncalcified plaque about the descending aorta. No evidence of aortic hematoma. There is multi chamber cardiomegaly. No central pulmonary embolus to the lobar level. No pericardial effusion. Mediastinum/Nodes: No mediastinal or hilar adenopathy. No axillary adenopathy. The esophagus is decompressed. No visualized thyroid nodule. Lungs/Pleura: Mild hypoventilatory change at the lung bases. No consolidation, pulmonary edema or pleural effusion. Musculoskeletal: Minimal degenerative disc disease in the spine. Subchondral cysts in both shoulders. There are no acute or suspicious osseous abnormalities. Review of the MIP images confirms the above findings. CTA ABDOMEN AND PELVIS FINDINGS VASCULAR Aorta: Suboptimal contrast timing bolus. Contrast bolus timing diagnostic to exclude aortic dissection or aneurysm. There is mild atherosclerosis. Celiac: Patent without evidence of aneurysm, dissection, vasculitis or significant stenosis. SMA: Patent without evidence of aneurysm, dissection, vasculitis or significant stenosis. Renals: Both renal arteries are patent without evidence of aneurysm, dissection, vasculitis, fibromuscular dysplasia or significant stenosis. IMA: Patent without evidence of aneurysm, dissection, vasculitis or significant stenosis. Inflow: Patent  without evidence of aneurysm, dissection, vasculitis or significant stenosis. Contrast bolus timing is suboptimal for more detailed assessment. Veins: No obvious venous abnormality within the limitations of this arterial phase study. Mixing of contrast in the superior mesenteric and portal vein. Review of the MIP images confirms the above findings. NON-VASCULAR Hepatobiliary: No focal liver abnormality is seen. No  gallstones, gallbladder wall thickening, or biliary dilatation. Pancreas: No ductal dilatation or inflammation. Spleen: Normal in size without focal abnormality. Adrenals/Urinary Tract: Normal adrenal glands. No hydronephrosis or perinephric edema. Ureters are decompressed. Urinary bladder is physiologically distended. No bladder wall thickening. Stomach/Bowel: Stomach is within normal limits. Appendix appears normal. No evidence of bowel wall thickening, distention, or inflammatory changes. Lymphatic: No adenopathy. Reproductive: Uterus and bilateral adnexa are unremarkable. Other: No free air, free fluid, or intra-abdominal fluid collection. Tiny fat containing umbilical hernia. Musculoskeletal: There are no acute or suspicious osseous abnormalities. Facet arthropathy in the lumbar spine. Mild degenerative disc disease. Incidental 3 cm intramuscular lipoma within the right external oblique muscle. Review of the MIP images confirms the above findings. IMPRESSION: 1. No evidence of acute aortic abnormality. Mild thoracoabdominal aortic atherosclerosis. 2. Mild multi chamber cardiomegaly. 3. No acute abnormality in the chest, abdomen, or pelvis. Electronically Signed   By: Jeb Levering M.D.   On: 08/06/2017 21:49    Time Spent in minutes  30   Jani Gravel M.D on 08/09/2017 at 10:42 AM  Between 7am to 7pm - Pager - [phone]  After 7pm go to [email or web address] - password [email or web address]  [facility] -  Office  [phone]

## 2017-08-09 NOTE — Progress Notes (Signed)
Attempted to call [facility] lab [phone]) to confirm pt's time for procedure and was told they were in the middle of a STEMI and did not have time to look that up right now.

## 2017-08-09 NOTE — Discharge Summary (Addendum)
Tracie Estrada, is a 51 y.o. female  DOB 17-Nov-1966  MRN [id number].  Admission date:  08/06/2017  Admitting Physician  Vianne Bulls, MD  Discharge Date:  08/11/2017   Primary MD  Tresa Garter, MD  Recommendations for primary care physician for things to follow:   Chest pain with mild elevation of troponin.  CAD (nonobstructive) EKG nonacute. Stress test 9/15=>  intermediate risk Cardiac cath 9/17=>nonobstructive CAD Continue aspirin, Lipitor Check lipid in 22months Medical management F/u with cardiology in 1-2 weeks  Malignant hypertension Continue Norvasc, hydralazine, Lopressor, Chlorthalidone Start spironolactone 25mg  po qday Add clonidine 0.1mg  po tid prn sbp >160 Please check bp in 1 week by pcp  Acute kidney injury. -Resolved with IV fluids. check cmp in 1 week  Hematuria -Significance unclear Repeat ua ,negative Please check ua in 1 week  Glucose intolerance check hga1c in 2-3 months  Admission Diagnosis  Hypokalemia [E87.6] Chest pain, unspecified type [R07.9] Hypertension, unspecified type [I10]   Discharge Diagnosis  Hypokalemia [E87.6] Chest pain, unspecified type [R07.9] Hypertension, unspecified type [I10]    Principal Problem:   Chest pain Active Problems:   Accelerated hypertension   Hypertensive urgency   AKI (acute kidney injury) (HCC)   Elevated troponin   Tobacco abuse   Abnormal stress test   CAD (coronary artery disease) of artery bypass graft      Past Medical History:  Diagnosis Date  . Acute renal failure ([facility]) 10/13/2013   On Vancomycin  . Breast abscess of female    Status post biopsy  . Hypertension   . Malignant hypertension 10/13/2013    Past Surgical History:  Procedure Laterality Date  . Biopsy of left breast    . INCISION AND DRAINAGE ABSCESS Left 10/11/2013   Procedure: INCISION AND DRAINAGE and debridement LEFT  BREAST ABSCESS;  Surgeon: Odis Hollingshead, MD;  Location: [facility];  Service: General;  Laterality: Left;  . LEFT HEART CATH AND CORONARY ANGIOGRAPHY N/A 08/10/2017   Procedure: LEFT HEART CATH AND CORONARY ANGIOGRAPHY;  Surgeon: Jettie Booze, MD;  Location: [facility];  Service: Cardiovascular;  Laterality: N/A;       HPI  from the history and physical done on the day of admission:     51 y.o. female with medical history significant for accelerated hypertension, now presenting to the emergency department with acute onset of chest pain. Patient reports that she been in her usual state of health and was cooking at home when she developed the acute onset of chest pain described as an intense pressure in the central chest with radiation to the left arm, worse with exertion, intermittent, and associated with mild dyspnea. She denies any nausea or vomiting and denies any diaphoresis. No recent fevers, chills, or cough. She had not taken any of her blood pressure medications today. Denies headache, change in vision or hearing, or focal numbness or weakness.  ED Course: Upon arrival to the ED, patient is found to be afebrile, saturating  well on room air, tachycardic in the low 100s, and with blood pressure of 210/130.chest x-ray features borderline cardiomegaly but no acute cardiopulmonary disease. EKG features a sinus rhythm with LVH and secondary repolarization abnormality. Troponin is within the normal limits. Chemstrip panel is notable for potassium 2.9 and creatinine of 1.40, up from 0.8 in June. CBC features a mild leukocytosis to 11,000. CT angiogram of the chest, abdomen, and pelvis was performed and is negative for any acute process in the chest, abdomen, or pelvis. She was treated with 324 mg of aspirin, 20 mg IV labetalol, 4 mg IV morphine, Zofran, 40 mEq oral potassium, and 10 mEq IV potassium in the ED. Blood pressure normalized and chest pain nearly resolved with this. She will be  observed on telemetry unit for ongoing evaluation and management of chest pressure with marked elevation in blood pressure and reassuring initial troponin and EKG.    Hospital Course:     Pt was admitted for chest pain,  Trop 0.04,  Cardiology consulted, hydralazine increased to 100mg  po tid due to poor bp control and hydrochlorothiazide changed to chlorthalidone by cardiology  Renin/aldosterone=> negative.  . Pt had mild ARF, creatinine 1.4 on admission and this resolved with ivf.  Pt had mild hypokalemia which was repleted.   Nuclear stress test 9/15=> intermediate risk Cardiac cath 9/17=> nonosbstructive CAD  Pt has not had any further chest pain and will be discharged to home.   Follow UP  Follow-up Information    Tresa Garter, MD Follow up in 1 week(s).   Specialty:  Internal Medicine Contact information: [place] [address] [phone]        Burnell Blanks, MD Follow up in 2 week(s).   Specialty:  Cardiology Contact information: [address] [place] [address] [phone]            Consults obtained - cardiology  Discharge Condition: stable  Diet and Activity recommendation: See Discharge Instructions below  Discharge Instructions         Discharge Medications     Allergies as of 08/11/2017      Reactions   Fish Allergy Anaphylaxis   Peanuts [peanut Oil] Anaphylaxis   Other Other (See Comments)   Anesthesia.  Specific agent unknown.  Reaction unknown.    Penicillins Itching   Vancomycin Other (See Comments)   Nephrotoxicity      Medication List    STOP taking these medications   hydrochlorothiazide 25 MG tablet Commonly known as:  HYDRODIURIL     TAKE these medications   amLODipine 10 MG tablet Commonly known as:  NORVASC TAKE 1 TABLET (10 MG TOTAL) BY MOUTH DAILY.   aspirin EC 81 MG tablet Take 1 tablet (81 mg total) by mouth daily.   atorvastatin 40 MG tablet Commonly known  as:  LIPITOR Take 1 tablet (40 mg total) by mouth daily at 6 PM.   chlorthalidone 25 MG tablet Commonly known as:  HYGROTON Take 1 tablet (25 mg total) by mouth daily.   cloNIDine 0.1 MG tablet Commonly known as:  CATAPRES Take 1 tablet (0.1 mg total) by mouth 3 (three) times daily as needed (sbp >160).   hydrALAZINE 100 MG tablet Commonly known as:  APRESOLINE Take 1 tablet (100 mg total) by mouth every 8 (eight) hours. What changed:  medication strength  how much to take   metoprolol tartrate 50 MG tablet Commonly known as:  LOPRESSOR Take 1 tablet (50 mg  total) by mouth 2 (two) times daily.   nitroGLYCERIN 0.4 MG SL tablet Commonly known as:  NITROSTAT Place 1 tablet (0.4 mg total) under the tongue every 5 (five) minutes x 3 doses as needed for chest pain.   potassium chloride 10 MEQ tablet Commonly known as:  K-DUR Take 1 tablet (10 mEq total) by mouth daily.   spironolactone 25 MG tablet Commonly known as:  ALDACTONE Take 1 tablet (25 mg total) by mouth daily.            Discharge Care Instructions        Start     Ordered   08/11/17 0000  atorvastatin (LIPITOR) 40 MG tablet  Daily-1800     08/11/17 0852   08/11/17 0000  chlorthalidone (HYGROTON) 25 MG tablet  Daily     08/11/17 0852   08/11/17 0000  hydrALAZINE (APRESOLINE) 100 MG tablet  Every 8 hours     08/11/17 0852   08/11/17 0000  nitroGLYCERIN (NITROSTAT) 0.4 MG SL tablet  Every 5 min x3 PRN     08/11/17 0852   08/11/17 0000  spironolactone (ALDACTONE) 25 MG tablet  Daily     08/11/17 0940   08/11/17 0000  cloNIDine (CATAPRES) 0.1 MG tablet  3 times daily PRN     08/11/17 0940      Major procedures and Radiology Reports - PLEASE review detailed and final reports for all details, in brief -      Dg Chest 2 View  Result Date: 08/06/2017 CLINICAL DATA:  Generalized chest pain associated with shortness of breath. History of hypertension, current smoker. EXAM: CHEST  2 VIEW COMPARISON:   Portable chest x-ray of November 22, 2016 FINDINGS: The lungs are adequately inflated. There is no focal infiltrate. There is no pleural effusion. The heart is top-normal in size but stable. The pulmonary vascularity is normal. The mediastinum is normal in width. The bony thorax is unremarkable. IMPRESSION: Borderline cardiomegaly less conspicuous than on the previous study. No pulmonary vascular congestion nor other acute cardiopulmonary abnormality. Electronically Signed   By: David  Martinique M.D.   On: 08/06/2017 15:42   Nm Myocar Multi W/spect W/wall Motion / Ef  Result Date: 08/08/2017  There was no ST segment deviation noted during stress.  Defect 1: There is a medium defect of mild severity present in the basal inferolateral, mid inferolateral and apical lateral location.  Findings consistent with ischemia.  This is an intermediate risk study.  Nuclear stress EF: 44%.  Intermediate risk stress nuclear study with inferolateral ischemia and mildly depressed left ventricular systolic function.   Ct Angio Chest/abd/pel For Dissection W And/or Wo Contrast  Result Date: 08/06/2017 CLINICAL DATA:  Chest and back pain. Clinical concern for aortic dissection. EXAM: CT ANGIOGRAPHY CHEST, ABDOMEN AND PELVIS TECHNIQUE: Multidetector CT imaging through the chest, abdomen and pelvis was performed using the standard protocol during bolus administration of intravenous contrast. Multiplanar reconstructed images and MIPs were obtained and reviewed to evaluate the vascular anatomy. CONTRAST:  100 cc Isovue 370 IV COMPARISON:  Chest radiograph earlier this day. FINDINGS: CTA CHEST FINDINGS Cardiovascular: There is mild motion artifact through the ascending aorta. No aortic dissection or aneurysm. Mild noncalcified plaque about the descending aorta. No evidence of aortic hematoma. There is multi chamber cardiomegaly. No central pulmonary embolus to the lobar level. No pericardial effusion. Mediastinum/Nodes: No  mediastinal or hilar adenopathy. No axillary adenopathy. The esophagus is decompressed. No visualized thyroid nodule. Lungs/Pleura: Mild hypoventilatory change at the  lung bases. No consolidation, pulmonary edema or pleural effusion. Musculoskeletal: Minimal degenerative disc disease in the spine. Subchondral cysts in both shoulders. There are no acute or suspicious osseous abnormalities. Review of the MIP images confirms the above findings. CTA ABDOMEN AND PELVIS FINDINGS VASCULAR Aorta: Suboptimal contrast timing bolus. Contrast bolus timing diagnostic to exclude aortic dissection or aneurysm. There is mild atherosclerosis. Celiac: Patent without evidence of aneurysm, dissection, vasculitis or significant stenosis. SMA: Patent without evidence of aneurysm, dissection, vasculitis or significant stenosis. Renals: Both renal arteries are patent without evidence of aneurysm, dissection, vasculitis, fibromuscular dysplasia or significant stenosis. IMA: Patent without evidence of aneurysm, dissection, vasculitis or significant stenosis. Inflow: Patent without evidence of aneurysm, dissection, vasculitis or significant stenosis. Contrast bolus timing is suboptimal for more detailed assessment. Veins: No obvious venous abnormality within the limitations of this arterial phase study. Mixing of contrast in the superior mesenteric and portal vein. Review of the MIP images confirms the above findings. NON-VASCULAR Hepatobiliary: No focal liver abnormality is seen. No gallstones, gallbladder wall thickening, or biliary dilatation. Pancreas: No ductal dilatation or inflammation. Spleen: Normal in size without focal abnormality. Adrenals/Urinary Tract: Normal adrenal glands. No hydronephrosis or perinephric edema. Ureters are decompressed. Urinary bladder is physiologically distended. No bladder wall thickening. Stomach/Bowel: Stomach is within normal limits. Appendix appears normal. No evidence of bowel wall thickening,  distention, or inflammatory changes. Lymphatic: No adenopathy. Reproductive: Uterus and bilateral adnexa are unremarkable. Other: No free air, free fluid, or intra-abdominal fluid collection. Tiny fat containing umbilical hernia. Musculoskeletal: There are no acute or suspicious osseous abnormalities. Facet arthropathy in the lumbar spine. Mild degenerative disc disease. Incidental 3 cm intramuscular lipoma within the right external oblique muscle. Review of the MIP images confirms the above findings. IMPRESSION: 1. No evidence of acute aortic abnormality. Mild thoracoabdominal aortic atherosclerosis. 2. Mild multi chamber cardiomegaly. 3. No acute abnormality in the chest, abdomen, or pelvis. Electronically Signed   By: Jeb Levering M.D.   On: 08/06/2017 21:49    Micro Results   No results found for this or any previous visit (from the past 240 hour(s)).     Today   Subjective    Tracie Estrada today has no headache,no chest abdominal pain,no new weakness tingling or numbness, feels much better wants to go home today.    Objective   Blood pressure (!) 171/84, pulse 63, temperature 98.2 F (36.8 C), temperature source Oral, resp. rate 18, height 4\' 11"  (1.499 m), weight 84.3 kg (185 lb 14.4 oz), last menstrual period 07/20/2017, SpO2 100 %.   Intake/Output Summary (Last 24 hours) at 08/11/17 0940 Last data filed at 08/11/17 0913  Gross per 24 hour  Intake              360 ml  Output                0 ml  Net              360 ml    Exam Awake Alert, Oriented x 3, No new F.N deficits, Normal affect [place].AT,PERRAL Supple Neck,No JVD, No cervical lymphadenopathy appriciated.  Symmetrical Chest wall movement, Good air movement bilaterally, CTAB RRR,No Gallops,Rubs or new Murmurs, No Parasternal Heave +ve B.Sounds, Abd Soft, Non tender, No organomegaly appriciated, No rebound -guarding or rigidity. No Cyanosis, Clubbing or edema, No new Rash or bruise Good radial and ulnar pulses, and  good dp pulses.    Data Review   CBC w Diff:  Lab Results  Component Value Date   WBC 10.2 08/09/2017   HGB 13.9 08/09/2017   HCT 39.7 08/09/2017   PLT 308 08/09/2017   LYMPHOPCT 7 (L) 05/18/2015   MONOPCT 13 (H) 05/18/2015   EOSPCT 0 05/18/2015   BASOPCT 0 05/18/2015    CMP:  Lab Results  Component Value Date   NA 136 08/09/2017   K 3.6 08/09/2017   CL 103 08/09/2017   CO2 24 08/09/2017   BUN 16 08/09/2017   CREATININE 0.86 08/09/2017   CREATININE 0.99 11/20/2016   PROT 6.9 08/09/2017   ALBUMIN 3.5 08/09/2017   BILITOT 0.4 08/09/2017   ALKPHOS 55 08/09/2017   AST 15 08/09/2017   ALT 12 (L) 08/09/2017  .   Total Time in preparing paper work, data evaluation and todays exam - 16 minutes  Jani Gravel M.D on 08/11/2017 at 9:40 AM  [facility]   Office  [phone]

## 2017-08-09 NOTE — Progress Notes (Signed)
Progress Note  Patient Name: Tracie Estrada Date of Encounter: 08/09/2017  Primary Cardiologist:   Subjective   No chest pain, no SOB  Inpatient Medications    Scheduled Meds: . amLODipine  10 mg Oral Daily  . aspirin EC  81 mg Oral Daily  . atorvastatin  40 mg Oral q1800  . enoxaparin (LOVENOX) injection  40 mg Subcutaneous QHS  . hydrALAZINE  100 mg Oral Q8H  . hydrochlorothiazide  25 mg Oral Daily  . metoprolol tartrate  50 mg Oral BID  . ondansetron (ZOFRAN) IV  4 mg Intravenous Once  . potassium chloride  10 mEq Oral Daily   Continuous Infusions:  PRN Meds: acetaminophen, hydrALAZINE, labetalol, morphine injection, nitroGLYCERIN, ondansetron (ZOFRAN) IV   Vital Signs    Vitals:   08/08/17 1400 08/08/17 1530 08/08/17 2011 08/09/17 0453  BP: (!) 152/100 (!) 142/69 (!) 177/97 (!) 169/86  Pulse:   81 64  Resp:   19 18  Temp:   98.4 F (36.9 C) 98.1 F (36.7 C)  TempSrc:   Oral Oral  SpO2:   100% 100%  Weight:      Height:       No intake or output data in the 24 hours ending 08/09/17 0901 Filed Weights   08/06/17 1428  Weight: 190 lb (86.2 kg)    Telemetry    NSR - Personally Reviewed  ECG     Physical Exam   GEN: No acute distress.   Neck: No JVD Cardiac: RRR, no murmurs, rubs, or gallops.  Respiratory: Clear to auscultation bilaterally. GI: Soft, nontender, non-distended  MS: No edema; No deformity. Neuro:  Nonfocal  Psych: Normal affect   Labs    Chemistry Recent Labs Lab 08/06/17 1613 08/07/17 0632 08/09/17 0436  NA 138 138 136  K 2.9* 3.5 3.6  CL 98* 102 103  CO2 31 28 24   GLUCOSE 123* 111* 104*  BUN 17 15 16   CREATININE 1.40* 0.98 0.86  CALCIUM 9.1 8.5* 8.7*  PROT  --   --  6.9  ALBUMIN  --   --  3.5  AST  --   --  15  ALT  --   --  12*  ALKPHOS  --   --  55  BILITOT  --   --  0.4  GFRNONAA 42* >60 >60  GFRAA 49* >60 >60  ANIONGAP 9 8 9      Hematology Recent Labs Lab 08/06/17 1613 08/07/17 0632 08/09/17 0436    WBC 11.0* 7.6 10.2  RBC 4.80 4.36 4.84  HGB 13.7 12.2 13.9  HCT 40.3 36.6 39.7  MCV 84.0 83.9 82.0  MCH 28.5 28.0 28.7  MCHC 34.0 33.3 35.0  RDW 15.4 15.2 15.2  PLT 321 281 308    Cardiac Enzymes Recent Labs Lab 08/07/17 1158 08/07/17 1345 08/07/17 1847 08/08/17 0054  TROPONINI 0.04* 0.04* 0.03* 0.03*    Recent Labs Lab 08/06/17 1623  TROPIPOC 0.04     BNPNo results for input(s): BNP, PROBNP in the last 168 hours.   DDimer No results for input(s): DDIMER in the last 168 hours.   Radiology    Nm Myocar Multi W/spect W/wall Motion / Ef  Result Date: 08/08/2017  There was no ST segment deviation noted during stress.  Defect 1: There is a medium defect of mild severity present in the basal inferolateral, mid inferolateral and apical lateral location.  Findings consistent with ischemia.  This is an intermediate risk study.  Nuclear stress EF: 44%.  Intermediate risk stress nuclear study with inferolateral ischemia and mildly depressed left ventricular systolic function.    Cardiac Studies    Patient Profile     TARIYA MORRISSETTE is a 51 y.o. female with a hx of HTN admitted with severe HTN and chest pain. Abnormal nuclear stress test this admission, intermediate risk.   Assessment & Plan    1. Chest pain -  admitted with chest pain in the setting of severe HTN with SBPs above 200s. Mild fairly flat troponin, EKG SR with LVH and likely strain pattern vs lateral ischemia - echo LVEF 65%, no WMAs, grade II diastolic dysfunction - nuclear stress medium sized inferolateral and apical lateral areas of ischemia, intermediate risk - plan for cath tomorrow. Medical therapy with ASA, atorva, lopressor. If CAD confirmed would start ACE-I   2. HTN - remains elevated. Hydral has been increased to100mg  tid this admission - we will change HCTZ to chlorthalidone for more potent bp effect - can consider ACE-I, particularly if CAD is confirmed   I have reviewed the risks,  indications, and alternatives to cardiac catheterization, possible angioplasty, and stenting with the patient  today. Risks include but are not limited to bleeding, infection, vascular injury, stroke, myocardial infection, arrhythmia, kidney injury, radiation-related injury in the case of prolonged fluoroscopy use, emergency cardiac surgery, and death. The patient understands the risks of serious complication is 1-2 in 7989 with diagnostic cardiac cath and 1-2% or less with angioplasty/stenting.   For questions or updates, please contact [place] Please consult [email or web address] for contact info under Cardiology/STEMI.      Merrily Pew, MD  08/09/2017, 9:01 AM

## 2017-08-10 ENCOUNTER — Encounter (HOSPITAL_COMMUNITY)
Admission: EM | Disposition: A | Discharge status: Home / Self Care | Payer: Self-pay | Source: Home / Self Care | Attending: Internal Medicine

## 2017-08-10 DIAGNOSIS — R319 Hematuria, unspecified: Secondary | ICD-10-CM

## 2017-08-10 DIAGNOSIS — R9439 Abnormal result of other cardiovascular function study: Secondary | ICD-10-CM

## 2017-08-10 HISTORY — PX: LEFT HEART CATH AND CORONARY ANGIOGRAPHY: CATH118249

## 2017-08-10 LAB — URINALYSIS, ROUTINE W REFLEX MICROSCOPIC
BILIRUBIN URINE: NEGATIVE
Glucose, UA: NEGATIVE mg/dL
HGB URINE DIPSTICK: NEGATIVE
Ketones, ur: NEGATIVE mg/dL
Leukocytes, UA: NEGATIVE
Nitrite: NEGATIVE
PH: 5 (ref 5.0–8.0)
Protein, ur: NEGATIVE mg/dL
SPECIFIC GRAVITY, URINE: 1.015 (ref 1.005–1.030)

## 2017-08-10 LAB — PREGNANCY, URINE: PREG TEST UR: NEGATIVE

## 2017-08-10 LAB — PROTIME-INR
INR: 0.95
PROTHROMBIN TIME: 12.6 s (ref 11.4–15.2)

## 2017-08-10 SURGERY — LEFT HEART CATH AND CORONARY ANGIOGRAPHY
Anesthesia: LOCAL

## 2017-08-10 MED ORDER — IOPAMIDOL (ISOVUE-370) INJECTION 76%
INTRAVENOUS | Status: AC
  Filled 2017-08-10: qty 100

## 2017-08-10 MED ORDER — HEPARIN SODIUM (PORCINE) 1000 UNIT/ML IJ SOLN
INTRAMUSCULAR | Status: AC
  Filled 2017-08-10: qty 1

## 2017-08-10 MED ORDER — ASPIRIN 81 MG PO CHEW
81.0000 mg | CHEWABLE_TABLET | ORAL | Status: AC
  Administered 2017-08-10: 81 mg via ORAL
  Filled 2017-08-10: qty 1

## 2017-08-10 MED ORDER — SODIUM CHLORIDE 0.9 % WEIGHT BASED INFUSION: 3.0000 mL/kg/h | INTRAVENOUS | Status: DC

## 2017-08-10 MED ORDER — SODIUM CHLORIDE 0.9 % IV SOLN: INTRAVENOUS | Status: AC

## 2017-08-10 MED ORDER — SODIUM CHLORIDE 0.9% FLUSH: 3.0000 mL | Freq: Two times a day (BID) | INTRAVENOUS | Status: DC

## 2017-08-10 MED ORDER — HEPARIN (PORCINE) IN NACL 2-0.9 UNIT/ML-% IJ SOLN
INTRAMUSCULAR | Status: AC | PRN
  Administered 2017-08-10: 1500 mL

## 2017-08-10 MED ORDER — LIDOCAINE HCL (PF) 1 % IJ SOLN
INTRAMUSCULAR | Status: AC
  Filled 2017-08-10: qty 30

## 2017-08-10 MED ORDER — SODIUM CHLORIDE 0.9 % IV SOLN: 250.0000 mL | INTRAVENOUS | Status: DC | PRN

## 2017-08-10 MED ORDER — VERAPAMIL HCL 2.5 MG/ML IV SOLN
INTRAVENOUS | Status: DC | PRN
  Administered 2017-08-10: 10 mL via INTRA_ARTERIAL

## 2017-08-10 MED ORDER — SODIUM CHLORIDE 0.9 % WEIGHT BASED INFUSION: 1.0000 mL/kg/h | INTRAVENOUS | Status: DC

## 2017-08-10 MED ORDER — SODIUM CHLORIDE 0.9% FLUSH: 3.0000 mL | INTRAVENOUS | Status: DC | PRN

## 2017-08-10 MED ORDER — FENTANYL CITRATE (PF) 100 MCG/2ML IJ SOLN
INTRAMUSCULAR | Status: DC | PRN
  Administered 2017-08-10: 25 ug via INTRAVENOUS
  Administered 2017-08-10: 50 ug via INTRAVENOUS

## 2017-08-10 MED ORDER — ACETAMINOPHEN 325 MG PO TABS: 650.0000 mg | ORAL_TABLET | ORAL | Status: DC | PRN

## 2017-08-10 MED ORDER — SODIUM CHLORIDE 0.9 % WEIGHT BASED INFUSION
3.0000 mL/kg/h | INTRAVENOUS | Status: DC
  Administered 2017-08-10: 3 mL/kg/h via INTRAVENOUS

## 2017-08-10 MED ORDER — MIDAZOLAM HCL 2 MG/2ML IJ SOLN
INTRAMUSCULAR | Status: AC
  Filled 2017-08-10: qty 2

## 2017-08-10 MED ORDER — ALPRAZOLAM 0.25 MG PO TABS
0.1250 mg | ORAL_TABLET | Freq: Once | ORAL | Status: AC
  Administered 2017-08-10: 0.125 mg via ORAL
  Filled 2017-08-10: qty 1

## 2017-08-10 MED ORDER — MIDAZOLAM HCL 2 MG/2ML IJ SOLN
INTRAMUSCULAR | Status: DC | PRN
  Administered 2017-08-10: 2 mg via INTRAVENOUS
  Administered 2017-08-10: 1 mg via INTRAVENOUS

## 2017-08-10 MED ORDER — ONDANSETRON HCL 4 MG/2ML IJ SOLN: 4.0000 mg | Freq: Four times a day (QID) | INTRAMUSCULAR | Status: DC | PRN

## 2017-08-10 MED ORDER — FENTANYL CITRATE (PF) 100 MCG/2ML IJ SOLN
INTRAMUSCULAR | Status: AC
  Filled 2017-08-10: qty 2

## 2017-08-10 MED ORDER — ASPIRIN 81 MG PO CHEW: 81.0000 mg | CHEWABLE_TABLET | ORAL | Status: DC

## 2017-08-10 MED ORDER — SODIUM CHLORIDE 0.9% FLUSH
3.0000 mL | Freq: Two times a day (BID) | INTRAVENOUS | Status: DC
  Administered 2017-08-10: 3 mL via INTRAVENOUS

## 2017-08-10 MED ORDER — ACETAMINOPHEN 325 MG PO TABS
ORAL_TABLET | ORAL | Status: AC
  Filled 2017-08-10: qty 2

## 2017-08-10 MED ORDER — VERAPAMIL HCL 2.5 MG/ML IV SOLN
INTRAVENOUS | Status: AC
  Filled 2017-08-10: qty 2

## 2017-08-10 MED ORDER — HEPARIN SODIUM (PORCINE) 1000 UNIT/ML IJ SOLN
INTRAMUSCULAR | Status: DC | PRN
  Administered 2017-08-10: 4500 [IU] via INTRAVENOUS

## 2017-08-10 MED ORDER — IOPAMIDOL (ISOVUE-370) INJECTION 76%
INTRAVENOUS | Status: DC | PRN
  Administered 2017-08-10: 40 mL via INTRA_ARTERIAL

## 2017-08-10 SURGICAL SUPPLY — 10 items
CATH INFINITI 5 FR JL3.5 (CATHETERS) ×1 IMPLANT
CATH INFINITI JR4 5F (CATHETERS) ×1 IMPLANT
DEVICE RAD COMP TR BAND LRG (VASCULAR PRODUCTS) ×1 IMPLANT
GLIDESHEATH SLEND SS 6F .021 (SHEATH) ×1 IMPLANT
GUIDEWIRE INQWIRE 1.5J.035X260 (WIRE) IMPLANT
INQWIRE 1.5J .035X260CM (WIRE) ×2
KIT HEART LEFT (KITS) ×2 IMPLANT
PACK CARDIAC CATHETERIZATION (CUSTOM PROCEDURE TRAY) ×2 IMPLANT
TRANSDUCER W/STOPCOCK (MISCELLANEOUS) ×2 IMPLANT
TUBING CIL FLEX 10 FLL-RA (TUBING) ×2 IMPLANT

## 2017-08-10 NOTE — Progress Notes (Signed)
Patient ID: Tracie Estrada, female   DOB: 20-May-1966, 51 y.o.   MRN: [id number]                                                                PROGRESS NOTE                                                                                                                                                                                                             Patient Demographics:    Tracie Estrada, is a 51 y.o. female, DOB - [birth year], NWG:[id number]  Admit date - 08/06/2017   Admitting Physician Vianne Bulls, MD  Outpatient Primary MD for the patient is Tresa Garter, MD  LOS - 3  Outpatient Specialists:    Chief Complaint  Patient presents with  . Chest Pain       Brief Narrative    51 year old woman with history of accelerated hypertension presented with acute onset of chest pain. Was found to be markedly hypertensive and referred for further evaluation of chest pain.    Subjective:    Tracie Estrada today has no chest pain this am. Awaiting cath.    No headache, No abdominal pain - No Nausea, No new weakness tingling or numbness, No Cough - SOB.    Assessment  & Plan :    Principal Problem:   Chest pain Active Problems:   Accelerated hypertension   Hypertensive urgency   AKI (acute kidney injury) (HCC)   Elevated troponin   Tobacco abuse   Chest pain with mild elevation of troponin.  EKG nonacute. Stress test intermediate risk Continue aspirin, Lipitor Cardiology input appreciated. Cardiac cath 9/17 NPO after MN  Malignant hypertension -Improving Continue Norvasc, hydralazine, Lopressor, hydrochlorothiazide. Agree with cardiology d/c hydrochlorothiazide and start chlorthalidone  Acute kidney injury. -Resolved with IV fluids.  Hematuria -Significance unclear Repeat ua , if still + then  Check CT scan abd/ pelvis  Glucose intolerance   DVT prophylaxis:Enoxaparin  Code Status:full Family Communication:none Disposition  Plan:home      Lab Results  Component Value Date   PLT 308 08/09/2017      Anti-infectives    None        Objective:   Vitals:   08/09/17 1716 08/09/17 2029 08/10/17 0530 08/10/17 0611  BP:  132/72 (!) 138/97 (!) 169/76   Pulse: 73 71 63   Resp:  18 18   Temp:  98.4 F (36.9 C) 98.2 F (36.8 C)   TempSrc:  Oral Oral   SpO2: 100% 100% 100%   Weight:    84.3 kg (185 lb 14.4 oz)  Height:        Wt Readings from Last 3 Encounters:  08/10/17 84.3 kg (185 lb 14.4 oz)  07/29/17 86.2 kg (190 lb)  04/28/17 86.5 kg (190 lb 9.6 oz)     Intake/Output Summary (Last 24 hours) at 08/10/17 0708 Last data filed at 08/10/17 0600  Gross per 24 hour  Intake                0 ml  Output                0 ml  Net                0 ml     Physical Exam  Awake Alert, Oriented X 3, No new F.N deficits, Normal affect [place].AT,PERRAL Supple Neck,No JVD, No cervical lymphadenopathy appriciated.  Symmetrical Chest wall movement, Good air movement bilaterally, CTAB RRR,No Gallops,Rubs or new Murmurs, No Parasternal Heave +ve B.Sounds, Abd Soft, No tenderness, No organomegaly appriciated, No rebound - guarding or rigidity. No Cyanosis, Clubbing or edema, No new Rash or bruise      Data Review:    CBC  Recent Labs Lab 08/06/17 1613 08/07/17 0632 08/09/17 0436  WBC 11.0* 7.6 10.2  HGB 13.7 12.2 13.9  HCT 40.3 36.6 39.7  PLT 321 281 308  MCV 84.0 83.9 82.0  MCH 28.5 28.0 28.7  MCHC 34.0 33.3 35.0  RDW 15.4 15.2 15.2    Chemistries   Recent Labs Lab 08/06/17 1613 08/07/17 0632 08/09/17 0436  NA 138 138 136  K 2.9* 3.5 3.6  CL 98* 102 103  CO2 31 28 24   GLUCOSE 123* 111* 104*  BUN 17 15 16   CREATININE 1.40* 0.98 0.86  CALCIUM 9.1 8.5* 8.7*  AST  --   --  15  ALT  --   --  12*  ALKPHOS  --   --  55  BILITOT  --   --  0.4   ------------------------------------------------------------------------------------------------------------------ No results for  input(s): CHOL, HDL, LDLCALC, TRIG, CHOLHDL, LDLDIRECT in the last 72 hours.  Lab Results  Component Value Date   HGBA1C (H) 07/07/2010    5.8 (NOTE)                                                                       According to the ADA Clinical Practice Recommendations for 2011, when HbA1c is used as a screening test:   >=6.5%   Diagnostic of Diabetes Mellitus           (if abnormal result  is confirmed)  5.7-6.4%   Increased risk of developing Diabetes Mellitus  References:Diagnosis and Classification of Diabetes Mellitus,Diabetes Care,2011,34(Suppl 1):S62-S69 and Standards of Medical Care in         Diabetes - 2011,Diabetes HYQM,5784,69  (Suppl 1):S11-S61.   ------------------------------------------------------------------------------------------------------------------ No results for input(s): TSH, T4TOTAL, T3FREE, THYROIDAB in the last 72 hours.  Invalid input(s): FREET3 ------------------------------------------------------------------------------------------------------------------  No results for input(s): VITAMINB12, FOLATE, FERRITIN, TIBC, IRON, RETICCTPCT in the last 72 hours.  Coagulation profile No results for input(s): INR, PROTIME in the last 168 hours.  No results for input(s): DDIMER in the last 72 hours.  Cardiac Enzymes  Recent Labs Lab 08/07/17 1345 08/07/17 1847 08/08/17 0054  TROPONINI 0.04* 0.03* 0.03*   ------------------------------------------------------------------------------------------------------------------ No results found for: BNP  Inpatient Medications  Scheduled Meds: . amLODipine  10 mg Oral Daily  . [START ON 08/11/2017] aspirin  81 mg Oral Pre-Cath  . aspirin EC  81 mg Oral Daily  . atorvastatin  40 mg Oral q1800  . chlorthalidone  25 mg Oral Daily  . enoxaparin (LOVENOX) injection  40 mg Subcutaneous QHS  . hydrALAZINE  100 mg Oral Q8H  . metoprolol tartrate  50 mg Oral BID  . ondansetron (ZOFRAN) IV  4 mg Intravenous Once  .  potassium chloride  10 mEq Oral Daily  . sodium chloride flush  3 mL Intravenous Q12H   Continuous Infusions: . sodium chloride    . [START ON 08/11/2017] sodium chloride     Followed by  . [START ON 08/11/2017] sodium chloride     PRN Meds:.sodium chloride, acetaminophen, diphenhydrAMINE, hydrALAZINE, HYDROcodone-acetaminophen, labetalol, morphine injection, nitroGLYCERIN, ondansetron (ZOFRAN) IV, sodium chloride flush  Micro Results No results found for this or any previous visit (from the past 240 hour(s)).  Radiology Reports Dg Chest 2 View  Result Date: 08/06/2017 CLINICAL DATA:  Generalized chest pain associated with shortness of breath. History of hypertension, current smoker. EXAM: CHEST  2 VIEW COMPARISON:  Portable chest x-ray of November 22, 2016 FINDINGS: The lungs are adequately inflated. There is no focal infiltrate. There is no pleural effusion. The heart is top-normal in size but stable. The pulmonary vascularity is normal. The mediastinum is normal in width. The bony thorax is unremarkable. IMPRESSION: Borderline cardiomegaly less conspicuous than on the previous study. No pulmonary vascular congestion nor other acute cardiopulmonary abnormality. Electronically Signed   By: David  Martinique M.D.   On: 08/06/2017 15:42   Nm Myocar Multi W/spect W/wall Motion / Ef  Result Date: 08/08/2017  There was no ST segment deviation noted during stress.  Defect 1: There is a medium defect of mild severity present in the basal inferolateral, mid inferolateral and apical lateral location.  Findings consistent with ischemia.  This is an intermediate risk study.  Nuclear stress EF: 44%.  Intermediate risk stress nuclear study with inferolateral ischemia and mildly depressed left ventricular systolic function.   Ct Angio Chest/abd/pel For Dissection W And/or Wo Contrast  Result Date: 08/06/2017 CLINICAL DATA:  Chest and back pain. Clinical concern for aortic dissection. EXAM: CT ANGIOGRAPHY  CHEST, ABDOMEN AND PELVIS TECHNIQUE: Multidetector CT imaging through the chest, abdomen and pelvis was performed using the standard protocol during bolus administration of intravenous contrast. Multiplanar reconstructed images and MIPs were obtained and reviewed to evaluate the vascular anatomy. CONTRAST:  100 cc Isovue 370 IV COMPARISON:  Chest radiograph earlier this day. FINDINGS: CTA CHEST FINDINGS Cardiovascular: There is mild motion artifact through the ascending aorta. No aortic dissection or aneurysm. Mild noncalcified plaque about the descending aorta. No evidence of aortic hematoma. There is multi chamber cardiomegaly. No central pulmonary embolus to the lobar level. No pericardial effusion. Mediastinum/Nodes: No mediastinal or hilar adenopathy. No axillary adenopathy. The esophagus is decompressed. No visualized thyroid nodule. Lungs/Pleura: Mild hypoventilatory change at the lung bases. No consolidation, pulmonary edema or pleural effusion. Musculoskeletal: Minimal degenerative disc  disease in the spine. Subchondral cysts in both shoulders. There are no acute or suspicious osseous abnormalities. Review of the MIP images confirms the above findings. CTA ABDOMEN AND PELVIS FINDINGS VASCULAR Aorta: Suboptimal contrast timing bolus. Contrast bolus timing diagnostic to exclude aortic dissection or aneurysm. There is mild atherosclerosis. Celiac: Patent without evidence of aneurysm, dissection, vasculitis or significant stenosis. SMA: Patent without evidence of aneurysm, dissection, vasculitis or significant stenosis. Renals: Both renal arteries are patent without evidence of aneurysm, dissection, vasculitis, fibromuscular dysplasia or significant stenosis. IMA: Patent without evidence of aneurysm, dissection, vasculitis or significant stenosis. Inflow: Patent without evidence of aneurysm, dissection, vasculitis or significant stenosis. Contrast bolus timing is suboptimal for more detailed assessment. Veins:  No obvious venous abnormality within the limitations of this arterial phase study. Mixing of contrast in the superior mesenteric and portal vein. Review of the MIP images confirms the above findings. NON-VASCULAR Hepatobiliary: No focal liver abnormality is seen. No gallstones, gallbladder wall thickening, or biliary dilatation. Pancreas: No ductal dilatation or inflammation. Spleen: Normal in size without focal abnormality. Adrenals/Urinary Tract: Normal adrenal glands. No hydronephrosis or perinephric edema. Ureters are decompressed. Urinary bladder is physiologically distended. No bladder wall thickening. Stomach/Bowel: Stomach is within normal limits. Appendix appears normal. No evidence of bowel wall thickening, distention, or inflammatory changes. Lymphatic: No adenopathy. Reproductive: Uterus and bilateral adnexa are unremarkable. Other: No free air, free fluid, or intra-abdominal fluid collection. Tiny fat containing umbilical hernia. Musculoskeletal: There are no acute or suspicious osseous abnormalities. Facet arthropathy in the lumbar spine. Mild degenerative disc disease. Incidental 3 cm intramuscular lipoma within the right external oblique muscle. Review of the MIP images confirms the above findings. IMPRESSION: 1. No evidence of acute aortic abnormality. Mild thoracoabdominal aortic atherosclerosis. 2. Mild multi chamber cardiomegaly. 3. No acute abnormality in the chest, abdomen, or pelvis. Electronically Signed   By: Jeb Levering M.D.   On: 08/06/2017 21:49    Time Spent in minutes  30   Jani Gravel M.D on 08/10/2017 at 7:08 AM  Between 7am to 7pm - Pager - [phone]  After 7pm go to [email or web address] - password [facility]  [facility] -  Office  [phone]

## 2017-08-10 NOTE — H&P (View-Only) (Signed)
Progress Note  Patient Name: Tracie Estrada Date of Encounter: 08/09/2017  Primary Cardiologist:   Subjective   No chest pain, no SOB  Inpatient Medications    Scheduled Meds: . amLODipine  10 mg Oral Daily  . aspirin EC  81 mg Oral Daily  . atorvastatin  40 mg Oral q1800  . enoxaparin (LOVENOX) injection  40 mg Subcutaneous QHS  . hydrALAZINE  100 mg Oral Q8H  . hydrochlorothiazide  25 mg Oral Daily  . metoprolol tartrate  50 mg Oral BID  . ondansetron (ZOFRAN) IV  4 mg Intravenous Once  . potassium chloride  10 mEq Oral Daily   Continuous Infusions:  PRN Meds: acetaminophen, hydrALAZINE, labetalol, morphine injection, nitroGLYCERIN, ondansetron (ZOFRAN) IV   Vital Signs    Vitals:   08/08/17 1400 08/08/17 1530 08/08/17 2011 08/09/17 0453  BP: (!) 152/100 (!) 142/69 (!) 177/97 (!) 169/86  Pulse:   81 64  Resp:   19 18  Temp:   98.4 F (36.9 C) 98.1 F (36.7 C)  TempSrc:   Oral Oral  SpO2:   100% 100%  Weight:      Height:       No intake or output data in the 24 hours ending 08/09/17 0901 Filed Weights   08/06/17 1428  Weight: 190 lb (86.2 kg)    Telemetry    NSR - Personally Reviewed  ECG     Physical Exam   GEN: No acute distress.   Neck: No JVD Cardiac: RRR, no murmurs, rubs, or gallops.  Respiratory: Clear to auscultation bilaterally. GI: Soft, nontender, non-distended  MS: No edema; No deformity. Neuro:  Nonfocal  Psych: Normal affect   Labs    Chemistry Recent Labs Lab 08/06/17 1613 08/07/17 0632 08/09/17 0436  NA 138 138 136  K 2.9* 3.5 3.6  CL 98* 102 103  CO2 31 28 24   GLUCOSE 123* 111* 104*  BUN 17 15 16   CREATININE 1.40* 0.98 0.86  CALCIUM 9.1 8.5* 8.7*  PROT  --   --  6.9  ALBUMIN  --   --  3.5  AST  --   --  15  ALT  --   --  12*  ALKPHOS  --   --  55  BILITOT  --   --  0.4  GFRNONAA 42* >60 >60  GFRAA 49* >60 >60  ANIONGAP 9 8 9      Hematology Recent Labs Lab 08/06/17 1613 08/07/17 0632 08/09/17 0436    WBC 11.0* 7.6 10.2  RBC 4.80 4.36 4.84  HGB 13.7 12.2 13.9  HCT 40.3 36.6 39.7  MCV 84.0 83.9 82.0  MCH 28.5 28.0 28.7  MCHC 34.0 33.3 35.0  RDW 15.4 15.2 15.2  PLT 321 281 308    Cardiac Enzymes Recent Labs Lab 08/07/17 1158 08/07/17 1345 08/07/17 1847 08/08/17 0054  TROPONINI 0.04* 0.04* 0.03* 0.03*    Recent Labs Lab 08/06/17 1623  TROPIPOC 0.04     BNPNo results for input(s): BNP, PROBNP in the last 168 hours.   DDimer No results for input(s): DDIMER in the last 168 hours.   Radiology    Nm Myocar Multi W/spect W/wall Motion / Ef  Result Date: 08/08/2017  There was no ST segment deviation noted during stress.  Defect 1: There is a medium defect of mild severity present in the basal inferolateral, mid inferolateral and apical lateral location.  Findings consistent with ischemia.  This is an intermediate risk study.  Nuclear stress EF: 44%.  Intermediate risk stress nuclear study with inferolateral ischemia and mildly depressed left ventricular systolic function.    Cardiac Studies    Patient Profile     Tracie Estrada is a 51 y.o. female with a hx of HTN admitted with severe HTN and chest pain. Abnormal nuclear stress test this admission, intermediate risk.   Assessment & Plan    1. Chest pain -  admitted with chest pain in the setting of severe HTN with SBPs above 200s. Mild fairly flat troponin, EKG SR with LVH and likely strain pattern vs lateral ischemia - echo LVEF 65%, no WMAs, grade II diastolic dysfunction - nuclear stress medium sized inferolateral and apical lateral areas of ischemia, intermediate risk - plan for cath tomorrow. Medical therapy with ASA, atorva, lopressor. If CAD confirmed would start ACE-I   2. HTN - remains elevated. Hydral has been increased to100mg  tid this admission - we will change HCTZ to chlorthalidone for more potent bp effect - can consider ACE-I, particularly if CAD is confirmed   I have reviewed the risks,  indications, and alternatives to cardiac catheterization, possible angioplasty, and stenting with the patient  today. Risks include but are not limited to bleeding, infection, vascular injury, stroke, myocardial infection, arrhythmia, kidney injury, radiation-related injury in the case of prolonged fluoroscopy use, emergency cardiac surgery, and death. The patient understands the risks of serious complication is 1-2 in 8144 with diagnostic cardiac cath and 1-2% or less with angioplasty/stenting.   For questions or updates, please contact [place] Please consult [email or web address] for contact info under Cardiology/STEMI.      Merrily Pew, MD  08/09/2017, 9:01 AM

## 2017-08-10 NOTE — Interval H&P Note (Signed)
Cath Lab Visit (complete for each Cath Lab visit)  Clinical Evaluation Leading to the Procedure:   ACS: Yes.    Non-ACS:    Anginal Classification: CCS IV  Anti-ischemic medical therapy: Minimal Therapy (1 class of medications)  Non-Invasive Test Results: Intermediate-risk stress test findings: cardiac mortality 1-3%/year  Prior CABG: No previous CABG      History and Physical Interval Note:  08/10/2017 12:43 PM  Tracie Estrada  has presented today for surgery, with the diagnosis of unstable angina  The various methods of treatment have been discussed with the patient and family. After consideration of risks, benefits and other options for treatment, the patient has consented to  Procedure(s): LEFT HEART CATH AND CORONARY ANGIOGRAPHY (N/A) as a surgical intervention .  The patient's history has been reviewed, patient examined, no change in status, stable for surgery.  I have reviewed the patient's chart and labs.  Questions were answered to the patient's satisfaction.     Larae Grooms

## 2017-08-10 NOTE — Progress Notes (Signed)
TR BAND REMOVAL  LOCATION:    Radial rt wrist  DEFLATED PER PROTOCOL:   yes  TIME BAND OFF / DRESSING APPLIED:    1850/gauze and tegaderm  SITE UPON ARRIVAL:    Level 0; site just above band tender   SITE AFTER BAND REMOVAL:    Level 0, area remains tender to palpate, no hematoma   CIRCULATION SENSATION AND MOVEMENT:    Within Normal Limits : yes  COMMENTS:

## 2017-08-10 NOTE — Progress Notes (Signed)
Progress Note  Patient Name: Tracie Estrada Date of Encounter: 08/10/2017  Primary Cardiologist: new - Dr. Angelena Form  Subjective   Pt is tearful and extremely apprehensive about her heart cath today. Discussed the procedure and level of anesthesia. Ordered very low dose xanax to be given now. She c/o chest pain but does not want morphine or nitro.  Inpatient Medications    Scheduled Meds: . ALPRAZolam  0.125 mg Oral Once  . amLODipine  10 mg Oral Daily  . aspirin  81 mg Oral Pre-Cath  . aspirin EC  81 mg Oral Daily  . atorvastatin  40 mg Oral q1800  . chlorthalidone  25 mg Oral Daily  . enoxaparin (LOVENOX) injection  40 mg Subcutaneous QHS  . hydrALAZINE  100 mg Oral Q8H  . metoprolol tartrate  50 mg Oral BID  . ondansetron (ZOFRAN) IV  4 mg Intravenous Once  . potassium chloride  10 mEq Oral Daily  . sodium chloride flush  3 mL Intravenous Q12H   Continuous Infusions: . sodium chloride    . sodium chloride     PRN Meds: sodium chloride, acetaminophen, diphenhydrAMINE, hydrALAZINE, HYDROcodone-acetaminophen, labetalol, morphine injection, nitroGLYCERIN, ondansetron (ZOFRAN) IV, sodium chloride flush   Vital Signs    Vitals:   08/09/17 1716 08/09/17 2029 08/10/17 0530 08/10/17 0611  BP: 132/72 (!) 138/97 (!) 169/76   Pulse: 73 71 63   Resp:  18 18   Temp:  98.4 F (36.9 C) 98.2 F (36.8 C)   TempSrc:  Oral Oral   SpO2: 100% 100% 100%   Weight:    185 lb 14.4 oz (84.3 kg)  Height:        Intake/Output Summary (Last 24 hours) at 08/10/17 0912 Last data filed at 08/10/17 0600  Gross per 24 hour  Intake                0 ml  Output                0 ml  Net                0 ml   Filed Weights   08/06/17 1428 08/10/17 0611  Weight: 190 lb (86.2 kg) 185 lb 14.4 oz (84.3 kg)     Physical Exam   General: Well developed, well nourished, female appearing in no acute distress. Head: Normocephalic, atraumatic.  Neck: Supple without bruits, no JVD Lungs:  Resp  regular and unlabored, CTA. Heart: RRR, S1, S2, no S3, S4, or murmur; no rub. Abdomen: Soft, non-tender, non-distended with normoactive bowel sounds. No hepatomegaly. No rebound/guarding. No obvious abdominal masses. Extremities: No clubbing, cyanosis, no edema. Distal pedal pulses are 1+ bilaterally. Neuro: Alert and oriented X 3. Moves all extremities spontaneously. Psych: Normal affect.  Labs    Chemistry Recent Labs Lab 08/06/17 1613 08/07/17 0632 08/09/17 0436  NA 138 138 136  K 2.9* 3.5 3.6  CL 98* 102 103  CO2 31 28 24   GLUCOSE 123* 111* 104*  BUN 17 15 16   CREATININE 1.40* 0.98 0.86  CALCIUM 9.1 8.5* 8.7*  PROT  --   --  6.9  ALBUMIN  --   --  3.5  AST  --   --  15  ALT  --   --  12*  ALKPHOS  --   --  55  BILITOT  --   --  0.4  GFRNONAA 42* >60 >60  GFRAA 49* >60 >60  ANIONGAP 9  8 9     Hematology Recent Labs Lab 08/06/17 1613 08/07/17 0632 08/09/17 0436  WBC 11.0* 7.6 10.2  RBC 4.80 4.36 4.84  HGB 13.7 12.2 13.9  HCT 40.3 36.6 39.7  MCV 84.0 83.9 82.0  MCH 28.5 28.0 28.7  MCHC 34.0 33.3 35.0  RDW 15.4 15.2 15.2  PLT 321 281 308    Cardiac Enzymes Recent Labs Lab 08/07/17 1158 08/07/17 1345 08/07/17 1847 08/08/17 0054  TROPONINI 0.04* 0.04* 0.03* 0.03*    Recent Labs Lab 08/06/17 1623  TROPIPOC 0.04     BNPNo results for input(s): BNP, PROBNP in the last 168 hours.   DDimer No results for input(s): DDIMER in the last 168 hours.   Radiology    Nm Myocar Multi W/spect W/wall Motion / Ef  Result Date: 08/08/2017  There was no ST segment deviation noted during stress.  Defect 1: There is a medium defect of mild severity present in the basal inferolateral, mid inferolateral and apical lateral location.  Findings consistent with ischemia.  This is an intermediate risk study.  Nuclear stress EF: 44%.  Intermediate risk stress nuclear study with inferolateral ischemia and mildly depressed left ventricular systolic function.      Telemetry    NSR - Personally Reviewed  ECG    No new tracings - Personally Reviewed  Cardiac Studies   Echocardiogram 08/08/17: Study Conclusions - Left ventricle: Systolic function was vigorous. The estimated   ejection fraction was 65%. Wall motion was normal; there were no   regional wall motion abnormalities. Features are consistent with   a pseudonormal left ventricular filling pattern, with concomitant   abnormal relaxation and increased filling pressure (grade 2   diastolic dysfunction). Doppler parameters are consistent with   high ventricular filling pressure. - Mitral valve: There was mild regurgitation.   Patient Profile     51 y.o. female with a hx of HTN admitted with severe HTN and chest pain. Abnormal nuclear stress test this admission, intermediate risk. Will go for heart cath today.  Assessment & Plan    1. Chest pain - nuclear stress test was intermediate risk and she continues to have chest pain - plan heart catheterization today - echo with normal systolic function but grade 2 DD - continue ASA, statin, lopressor   2. HTN - pt pressure much better controlled today - pt stats she feels worse with her pressure in the "100s" compared to her normal pressure in the 200s - norvasc, hygroton, hydralazine, lopressor   Signed, Ledora Bottcher , PA-C 9:12 AM 08/10/2017 Pager: [phone]

## 2017-08-11 ENCOUNTER — Encounter (HOSPITAL_COMMUNITY): Payer: Self-pay | Admitting: Interventional Cardiology

## 2017-08-11 DIAGNOSIS — I25810 Atherosclerosis of coronary artery bypass graft(s) without angina pectoris: Secondary | ICD-10-CM

## 2017-08-11 MED ORDER — CLONIDINE HCL 0.1 MG PO TABS
0.1000 mg | ORAL_TABLET | Freq: Three times a day (TID) | ORAL | Status: DC
  Administered 2017-08-11: 0.1 mg via ORAL
  Filled 2017-08-11: qty 1

## 2017-08-11 MED ORDER — CLONIDINE HCL 0.1 MG PO TABS: 0.1000 mg | ORAL_TABLET | Freq: Three times a day (TID) | ORAL | 11 refills | Status: AC | PRN

## 2017-08-11 MED ORDER — ATORVASTATIN CALCIUM 40 MG PO TABS: 40.0000 mg | ORAL_TABLET | Freq: Every day | ORAL | 0 refills | Status: AC

## 2017-08-11 MED ORDER — CLONIDINE HCL 0.1 MG PO TABS: 0.1000 mg | ORAL_TABLET | Freq: Three times a day (TID) | ORAL | 11 refills | Status: DC

## 2017-08-11 MED ORDER — HYDRALAZINE HCL 100 MG PO TABS: 100.0000 mg | ORAL_TABLET | Freq: Three times a day (TID) | ORAL | 0 refills | Status: AC

## 2017-08-11 MED ORDER — SPIRONOLACTONE 25 MG PO TABS: 25.0000 mg | ORAL_TABLET | Freq: Every day | ORAL | 11 refills | Status: DC

## 2017-08-11 MED ORDER — CHLORTHALIDONE 25 MG PO TABS: 25.0000 mg | ORAL_TABLET | Freq: Every day | ORAL | 0 refills | Status: DC

## 2017-08-11 MED ORDER — NITROGLYCERIN 0.4 MG SL SUBL: 0.4000 mg | SUBLINGUAL_TABLET | SUBLINGUAL | 0 refills | Status: AC | PRN

## 2017-08-11 NOTE — Progress Notes (Addendum)
Progress Note  Patient Name: Tracie Estrada Date of Encounter: 08/11/2017  Primary Cardiologist: new - Dr. Angelena Form  Subjective   Patient is feeling well; denies chest pain, SOB, and palpitations.   Inpatient Medications    Scheduled Meds: . amLODipine  10 mg Oral Daily  . aspirin EC  81 mg Oral Daily  . atorvastatin  40 mg Oral q1800  . chlorthalidone  25 mg Oral Daily  . enoxaparin (LOVENOX) injection  40 mg Subcutaneous QHS  . hydrALAZINE  100 mg Oral Q8H  . metoprolol tartrate  50 mg Oral BID  . ondansetron (ZOFRAN) IV  4 mg Intravenous Once  . potassium chloride  10 mEq Oral Daily  . sodium chloride flush  3 mL Intravenous Q12H   Continuous Infusions: . sodium chloride     PRN Meds: sodium chloride, acetaminophen, diphenhydrAMINE, hydrALAZINE, HYDROcodone-acetaminophen, labetalol, morphine injection, nitroGLYCERIN, ondansetron (ZOFRAN) IV, sodium chloride flush   Vital Signs    Vitals:   08/10/17 1920 08/10/17 2035 08/10/17 2116 08/11/17 0538  BP: (!) 142/72 (!) 159/78  (!) 171/84  Pulse: (!) 57 (!) 58 63 63  Resp: 18 18  18   Temp:  98 F (36.7 C)  98.2 F (36.8 C)  TempSrc:  Oral  Oral  SpO2: 99% 98% 100% 100%  Weight:      Height:        Intake/Output Summary (Last 24 hours) at 08/11/17 0831 Last data filed at 08/10/17 1046  Gross per 24 hour  Intake                0 ml  Output                0 ml  Net                0 ml   Filed Weights   08/06/17 1428 08/10/17 0611  Weight: 190 lb (86.2 kg) 185 lb 14.4 oz (84.3 kg)     Physical Exam   General: Well developed, well nourished, female appearing in no acute distress. Head: Normocephalic, atraumatic.  Neck: Supple without bruits, JVD. Lungs:  Resp regular and unlabored, CTA. Heart: RRR, S1, S2, no S3, S4, or murmur; no rub. Abdomen: Soft, non-tender, non-distended with normoactive bowel sounds. No hepatomegaly. No rebound/guarding. No obvious abdominal masses. Extremities: No clubbing,  cyanosis, edema. Distal pedal pulses are 2+ bilaterally. Neuro: Alert and oriented X 3. Moves all extremities spontaneously. Psych: Normal affect.  Labs    Chemistry Recent Labs Lab 08/06/17 1613 08/07/17 0632 08/09/17 0436  NA 138 138 136  K 2.9* 3.5 3.6  CL 98* 102 103  CO2 31 28 24   GLUCOSE 123* 111* 104*  BUN 17 15 16   CREATININE 1.40* 0.98 0.86  CALCIUM 9.1 8.5* 8.7*  PROT  --   --  6.9  ALBUMIN  --   --  3.5  AST  --   --  15  ALT  --   --  12*  ALKPHOS  --   --  55  BILITOT  --   --  0.4  GFRNONAA 42* >60 >60  GFRAA 49* >60 >60  ANIONGAP 9 8 9      Hematology Recent Labs Lab 08/06/17 1613 08/07/17 0632 08/09/17 0436  WBC 11.0* 7.6 10.2  RBC 4.80 4.36 4.84  HGB 13.7 12.2 13.9  HCT 40.3 36.6 39.7  MCV 84.0 83.9 82.0  MCH 28.5 28.0 28.7  MCHC 34.0 33.3 35.0  RDW  15.4 15.2 15.2  PLT 321 281 308    Cardiac Enzymes Recent Labs Lab 08/07/17 1158 08/07/17 1345 08/07/17 1847 08/08/17 0054  TROPONINI 0.04* 0.04* 0.03* 0.03*    Recent Labs Lab 08/06/17 1623  TROPIPOC 0.04     BNPNo results for input(s): BNP, PROBNP in the last 168 hours.   DDimer No results for input(s): DDIMER in the last 168 hours.   Radiology    No results found.   Telemetry    NSR - Personally Reviewed  ECG    No new tracings - Personally Reviewed   Cardiac Studies   LHC 08/10/17:  Mid LAD lesion, 20 %stenosed.  Mid RCA lesion, 20 %stenosed.  2nd Diag lesion, 10 %stenosed.  The left ventricular systolic function is normal.  LV end diastolic pressure is normal.  The left ventricular ejection fraction is 55-65% by visual estimate.  There is no aortic valve stenosis.   Nonobstructive coronary artery disease.    COntinue aggressive preventive therapy.  She needs to stop smoking.    Echocardiogram 08/08/17: Study Conclusions - Left ventricle: Systolic function was vigorous. The estimated   ejection fraction was 65%. Wall motion was normal; there were  no   regional wall motion abnormalities. Features are consistent with   a pseudonormal left ventricular filling pattern, with concomitant   abnormal relaxation and increased filling pressure (grade 2   diastolic dysfunction). Doppler parameters are consistent with   high ventricular filling pressure. - Mitral valve: There was mild regurgitation.  Patient Profile     51 y.o. female with a hx of HTN admitted with severe HTN and chest pain. Abnormal nuclear stress test this admission, intermediate risk. Heart cath yesterday without obstructive disease.  Assessment & Plan    1. Chest pain - left heart cath yesterday without obstructive disease - her chest pain is not related to ACS - no further cardiac workup indicated at this time - continue ASA, statin, and lopressor  2. HTN - uncontrolled overnight and today - sBP 130-170s - norvasc, hygroton, and hydralazine ordered  - she has only received hydralazine this morning - if she continues to be hypertensive following 10AM medications, consider adding losartan for better pressure control   Signed, Ledora Bottcher , PA-C 8:31 AM 08/11/2017 Pager: [phone]  Attending Note:   The patient was seen and examined.  Agree with assessment and plan as noted above.  Changes made to the above note as needed.  Patient seen and independently examined with Doreene Adas, PA.   We discussed all aspects of the encounter. I agree with the assessment and plan as stated above.  1. Chest pain :  Has nonobstructive CAD by cath yesterday  Would aim for an LDL goal of 70 No further work up indicated at this time  2. Essential Hypertension: Her blood pressure still mildly elevated. She needs to avoid eating salt. She's currently on chlorthalidone 25 g a day, amlodipine 10 mg a day, hydralazine 100 mg 3 times a day, metoprolol 50 mg twice a day Clonidine has been started. I would consider adding an Aldactone .  This may work well in her. She would  need a BMP in 1-2 weeks after starting the aldactone   Her HTN can be followed by her primary MD.     I have spent a total of 40 minutes with patient reviewing hospital  notes , telemetry, EKGs, labs and examining patient as well as establishing an assessment and plan that was discussed  with the patient. > 50% of time was spent in direct patient care.    Thayer Headings, Brooke Bonito., MD, [facility] 08/11/2017, 9:14 AM [address],  [place] Pager [phone]

## 2017-08-13 MED FILL — ?SPIRONOLACTONE 25 MG TABLE: 25 | 30 days supply | Qty: 30 | Fill #0

## 2017-08-13 MED FILL — cloNIDine HCL 0.1 MG TABS: 0.1 | 20 days supply | Qty: 60 | Fill #0

## 2017-08-13 MED FILL — CHLORTHALIDONE 25 MG TABLET: 25 | 30 days supply | Qty: 30 | Fill #0

## 2017-08-13 MED FILL — ATORVASTATIN 40 MG TABLET: 40 | 30 days supply | Qty: 30 | Fill #0

## 2017-08-13 MED FILL — NITROSTAT 0.4 MG TABLET SL: 0.4 | 9 days supply | Qty: 25 | Fill #0

## 2017-08-13 MED FILL — hydrALAZINE HCL 100 MG TABS: 100 | 30 days supply | Qty: 90 | Fill #0

## 2017-08-19 ENCOUNTER — Encounter (HOSPITAL_COMMUNITY): Payer: Self-pay | Admitting: Emergency Medicine

## 2017-08-19 ENCOUNTER — Ambulatory Visit (HOSPITAL_COMMUNITY)
Admission: EM | Admit: 2017-08-19 | Discharge: 2017-08-19 | Disposition: A | Discharge status: Home / Self Care | Payer: Self-pay | Attending: Family Medicine | Admitting: Family Medicine

## 2017-08-19 DIAGNOSIS — M7989 Other specified soft tissue disorders: Secondary | ICD-10-CM

## 2017-08-19 NOTE — ED Triage Notes (Addendum)
Patient had an iv in right forearm after being evaluated for chest pain.  Patient says right forearm was swollen and staff gave her a ice pack to use.  2 days after going home noticed bruising.  .  Right forearm is sore, bruised, swollen and painful to rotating forearm.  No numbness or tingling in right hand or fingers.  Brisk cap refill.  Burning sensation in wrist and aching in forearm, and intermittent, sharp pain in right axilla.  Right radial pulse 2 +

## 2017-08-20 NOTE — ED Provider Notes (Signed)
  [place]   [id number] 08/19/17 Arrival Time: 2671  ASSESSMENT & PLAN:  1. Forearm swelling    Observe. Reports that she has f/u with her cardiologist tomorrow. May f/u here as needed. Reviewed expectations re: course of current medical issues. Questions answered. Outlined signs and symptoms indicating need for more acute intervention. Patient verbalized understanding. After Visit Summary given.   SUBJECTIVE:  CHRISTL FESSENDEN is a 51 y.o. female who reports having a L heart catheterization a few days ago. Wrist where IV inserted has been swollen and tender. Not worseing. "Just want it checked." Afebrile.  ROS: As per HPI.   OBJECTIVE:  Vitals:   08/19/17 1748  BP: (!) 166/89  Pulse: 89  Resp: 18  Temp: 98.3 F (36.8 C)  TempSrc: Oral  SpO2: 98%    General appearance: alert; no distress Extremities: R forearm; IV site with surrounding bruising; mild tenderness; normal pulse; FROM at wrist; normal sensation; no sign of infection Skin: warm and dry Psychological: alert and cooperative; normal mood and affect   Allergies  Allergen Reactions  . Fish Allergy Anaphylaxis  . Peanuts [Peanut Oil] Anaphylaxis  . Other Other (See Comments)    Anesthesia.  Specific agent unknown.  Reaction unknown.   Marland Kitchen Penicillins Itching  . Vancomycin Other (See Comments)    Nephrotoxicity    Past Medical History:  Diagnosis Date  . Acute renal failure ([place]) 10/13/2013   On Vancomycin  . Breast abscess of female    Status post biopsy  . Hypertension   . 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 Marital status: Single    Spouse name: N/A  . Number of children: N/A  . Years of education: N/A   Occupational History  . Not on file.   Social History Main Topics  . Smoking status: Current Every Day Smoker    Packs/day: 1.00    Years: 26.00    Types: Cigarettes  . Smokeless tobacco: Never Used  . Alcohol use No  . Drug use: No  .  Sexual activity: No   Other Topics Concern  . Not on file   Social History Narrative   Single.  Lives with daugher and grandchildren.   Family History  Problem Relation Age of Onset  . Breast cancer Mother   . Cancer Mother        brain  . Hypertension Father   . Thyroid disease Daughter    Past Surgical History:  Procedure Laterality Date  . Biopsy of left breast    . INCISION AND DRAINAGE ABSCESS Left 10/11/2013   Procedure: INCISION AND DRAINAGE and debridement LEFT BREAST ABSCESS;  Surgeon: Odis Hollingshead, MD;  Location: [facility];  Service: General;  Laterality: Left;  . LEFT HEART CATH AND CORONARY ANGIOGRAPHY N/A 08/10/2017   Procedure: LEFT HEART CATH AND CORONARY ANGIOGRAPHY;  Surgeon: Jettie Booze, MD;  Location: [facility];  Service: Cardiovascular;  Laterality: N/A;     Vanessa Kick, MD 08/20/17 [id number]

## 2017-08-24 ENCOUNTER — Encounter: Payer: Self-pay | Admitting: Physician Assistant

## 2017-08-24 ENCOUNTER — Ambulatory Visit (INDEPENDENT_AMBULATORY_CARE_PROVIDER_SITE_OTHER): Payer: Self-pay | Admitting: Physician Assistant

## 2017-08-24 VITALS — BP 170/104 | HR 86 | Ht 59.0 in | Wt 191.0 lb

## 2017-08-24 DIAGNOSIS — I10 Essential (primary) hypertension: Secondary | ICD-10-CM

## 2017-08-24 DIAGNOSIS — F17200 Nicotine dependence, unspecified, uncomplicated: Secondary | ICD-10-CM

## 2017-08-24 DIAGNOSIS — R9439 Abnormal result of other cardiovascular function study: Secondary | ICD-10-CM

## 2017-08-24 DIAGNOSIS — E785 Hyperlipidemia, unspecified: Secondary | ICD-10-CM

## 2017-08-24 MED ORDER — CHLORTHALIDONE 25 MG PO TABS: 50.0000 mg | ORAL_TABLET | Freq: Every day | ORAL | 6 refills | Status: DC

## 2017-08-24 NOTE — Patient Instructions (Signed)
Medication Instructions: Your physician has recommended you make the following change in your medication:  -1) INCREASE Chlorthalidone (Hygronton) - Take 2 tablets (50 mg) by mouth daily  Labwork: Your physician recommends that you return for lab work in: [place] for FASTING lipids and Hepatic Function Test  Procedures/Testing: None Ordered  Follow-Up: Your physician recommends that you schedule a follow-up appointment in: 1 WEEK with a Pharmacists  Your physician recommends that you schedule a follow-up appointment in: 2-3 MONTHS with Dr. Angelena Form  Any Additional Special Instructions Will Be Listed Below (If Applicable).   DASH Eating Plan DASH stands for "Dietary Approaches to Stop Hypertension." The DASH eating plan is a healthy eating plan that has been shown to reduce high blood pressure (hypertension). It may also reduce your risk for type 2 diabetes, heart disease, and stroke. The DASH eating plan may also help with weight loss. What are tips for following this plan? General guidelines  Avoid eating more than 2,000 mg (milligrams) of salt (sodium) a day. If you have hypertension, you may need to reduce your sodium intake to 1,500 mg a day.  Limit alcohol intake to no more than 1 drink a day for nonpregnant women and 2 drinks a day for men. One drink equals 12 oz of beer, 5 oz of wine, or 1 oz of hard liquor.  Work with your health care provider to maintain a healthy body weight or to lose weight. Ask what an ideal weight is for you.  Get at least 30 minutes of exercise that causes your heart to beat faster (aerobic exercise) most days of the week. Activities may include walking, swimming, or biking.  Work with your health care provider or diet and nutrition specialist (dietitian) to adjust your eating plan to your individual calorie needs. Reading food labels  Check food labels for the amount of sodium per serving. Choose foods with less than 5 percent of the Daily Value of  sodium. Generally, foods with less than 300 mg of sodium per serving fit into this eating plan.  To find whole grains, look for the word "whole" as the first word in the ingredient list. Shopping  Buy products labeled as "low-sodium" or "no salt added."  Buy fresh foods. Avoid canned foods and premade or frozen meals. Cooking  Avoid adding salt when cooking. Use salt-free seasonings or herbs instead of table salt or sea salt. Check with your health care provider or pharmacist before using salt substitutes.  Do not fry foods. Cook foods using healthy methods such as baking, boiling, grilling, and broiling instead.  Cook with heart-healthy oils, such as olive, canola, soybean, or sunflower oil. Meal planning   Eat a balanced diet that includes: ? 5 or more servings of fruits and vegetables each day. At each meal, try to fill half of your plate with fruits and vegetables. ? Up to 6-8 servings of whole grains each day. ? Less than 6 oz of lean meat, poultry, or fish each day. A 3-oz serving of meat is about the same size as a deck of cards. One egg equals 1 oz. ? 2 servings of low-fat dairy each day. ? A serving of nuts, seeds, or beans 5 times each week. ? Heart-healthy fats. Healthy fats called Omega-3 fatty acids are found in foods such as flaxseeds and coldwater fish, like sardines, salmon, and mackerel.  Limit how much you eat of the following: ? Canned or prepackaged foods. ? Food that is high in trans fat, such  as fried foods. ? Food that is high in saturated fat, such as fatty meat. ? Sweets, desserts, sugary drinks, and other foods with added sugar. ? Full-fat dairy products.  Do not salt foods before eating.  Try to eat at least 2 vegetarian meals each week.  Eat more home-cooked food and less restaurant, buffet, and fast food.  When eating at a restaurant, ask that your food be prepared with less salt or no salt, if possible. What foods are recommended? The items  listed may not be a complete list. Talk with your dietitian about what dietary choices are best for you. Grains Whole-grain or whole-wheat bread. Whole-grain or whole-wheat pasta. Brown rice. Modena Morrow. Bulgur. Whole-grain and low-sodium cereals. Pita bread. Low-fat, low-sodium crackers. Whole-wheat flour tortillas. Vegetables Fresh or frozen vegetables (raw, steamed, roasted, or grilled). Low-sodium or reduced-sodium tomato and vegetable juice. Low-sodium or reduced-sodium tomato sauce and tomato paste. Low-sodium or reduced-sodium canned vegetables. Fruits All fresh, dried, or frozen fruit. Canned fruit in natural juice (without added sugar). Meat and other protein foods Skinless chicken or [place]. Ground chicken or [place]. Pork with fat trimmed off. Fish and seafood. Egg whites. Dried beans, peas, or lentils. Unsalted nuts, nut butters, and seeds. Unsalted canned beans. Lean cuts of beef with fat trimmed off. Low-sodium, lean deli meat. Dairy Low-fat (1%) or fat-free (skim) milk. Fat-free, low-fat, or reduced-fat cheeses. Nonfat, low-sodium ricotta or cottage cheese. Low-fat or nonfat yogurt. Low-fat, low-sodium cheese. Fats and oils Soft margarine without trans fats. Vegetable oil. Low-fat, reduced-fat, or light mayonnaise and salad dressings (reduced-sodium). Canola, safflower, olive, soybean, and sunflower oils. Avocado. Seasoning and other foods Herbs. Spices. Seasoning mixes without salt. Unsalted popcorn and pretzels. Fat-free sweets. What foods are not recommended? The items listed may not be a complete list. Talk with your dietitian about what dietary choices are best for you. Grains Baked goods made with fat, such as croissants, muffins, or some breads. Dry pasta or rice meal packs. Vegetables Creamed or fried vegetables. Vegetables in a cheese sauce. Regular canned vegetables (not low-sodium or reduced-sodium). Regular canned tomato sauce and paste (not low-sodium or  reduced-sodium). Regular tomato and vegetable juice (not low-sodium or reduced-sodium). Angie Fava. Olives. Fruits Canned fruit in a light or heavy syrup. Fried fruit. Fruit in cream or butter sauce. Meat and other protein foods Fatty cuts of meat. Ribs. Fried meat. Berniece Salines. Sausage. Bologna and other processed lunch meats. Salami. Fatback. Hotdogs. Bratwurst. Salted nuts and seeds. Canned beans with added salt. Canned or smoked fish. Whole eggs or egg yolks. Chicken or [place] with skin. Dairy Whole or 2% milk, cream, and half-and-half. Whole or full-fat cream cheese. Whole-fat or sweetened yogurt. Full-fat cheese. Nondairy creamers. Whipped toppings. Processed cheese and cheese spreads. Fats and oils Butter. Stick margarine. Lard. Shortening. Ghee. Bacon fat. Tropical oils, such as coconut, palm kernel, or palm oil. Seasoning and other foods Salted popcorn and pretzels. Onion salt, garlic salt, seasoned salt, table salt, and sea salt. Worcestershire sauce. Tartar sauce. Barbecue sauce. Teriyaki sauce. Soy sauce, including reduced-sodium. Steak sauce. Canned and packaged gravies. Fish sauce. Oyster sauce. Cocktail sauce. Horseradish that you find on the shelf. Ketchup. Mustard. Meat flavorings and tenderizers. Bouillon cubes. Hot sauce and Tabasco sauce. Premade or packaged marinades. Premade or packaged taco seasonings. Relishes. Regular salad dressings. Where to find more information:  National Heart, Lung, and [place]: https://wilson-eaton.com/  American Heart Association: www.heart.org Summary  The DASH eating plan is a healthy eating plan that has been shown  to reduce high blood pressure (hypertension). It may also reduce your risk for type 2 diabetes, heart disease, and stroke.  With the DASH eating plan, you should limit salt (sodium) intake to 2,000 mg a day. If you have hypertension, you may need to reduce your sodium intake to 1,500 mg a day.  When on the DASH eating plan, aim to eat more  fresh fruits and vegetables, whole grains, lean proteins, low-fat dairy, and heart-healthy fats.  Work with your health care provider or diet and nutrition specialist (dietitian) to adjust your eating plan to your individual calorie needs. This information is not intended to replace advice given to you by your health care provider. Make sure you discuss any questions you have with your health care provider. Document Released: 10/30/2011 Document Revised: 11/03/2016 Document Reviewed: 11/03/2016 Elsevier Interactive Patient Education  2017 [employer].    If you need a refill on your cardiac medications before your next appointment, please call your pharmacy.

## 2017-08-24 NOTE — Progress Notes (Signed)
Cardiology Office Note    Date:  08/24/2017   ID:  JAHARA DAIL, DOB October 27, 1966, MRN [id number]  PCP:  Tresa Garter, MD  Cardiologist: Dr. Angelena Form  Chief Complaint  Patient presents with  . Follow-up    History of Present Illness:  Tracie Estrada is a 51 y.o. female who was admitted to [facility] with severe hypertension and chest pain. She had an abnormal nuclear stress test read as intermediate risk and cardiac catheterization 08/10/17 showed nonobstructive CAD with normal LVEF 55-65%. 2-D echo 08/08/17 normal LV function with grade 2 DD and mild MR. Blood pressure was treated with Norvasc, Hygroton and hydralazine, Lopressor and clonidine.   Patient comes in today with her blood pressure elevated. She didn't take any of her medications this morning because she was driving her grandson and she says the medications make her too sleepy. She also went to urgent care yesterday and have them look at her cath site because of the bruising and swelling. She denies any further chest pain or dyspnea. She says her blood pressure was in the 160s when she checked it at [facility] and at the urgent care.    Past Medical History:  Diagnosis Date  . Acute renal failure ([place]) 10/13/2013   On Vancomycin  . Breast abscess of female    Status post biopsy  . Hypertension   . Malignant hypertension 10/13/2013    Past Surgical History:  Procedure Laterality Date  . Biopsy of left breast    . INCISION AND DRAINAGE ABSCESS Left 10/11/2013   Procedure: INCISION AND DRAINAGE and debridement LEFT BREAST ABSCESS;  Surgeon: Odis Hollingshead, MD;  Location: [facility];  Service: General;  Laterality: Left;  . LEFT HEART CATH AND CORONARY ANGIOGRAPHY N/A 08/10/2017   Procedure: LEFT HEART CATH AND CORONARY ANGIOGRAPHY;  Surgeon: Jettie Booze, MD;  Location: [facility];  Service: Cardiovascular;  Laterality: N/A;    Current Medications: Current Meds  Medication Sig  . amLODipine (NORVASC) 10  MG tablet TAKE 1 TABLET (10 MG TOTAL) BY MOUTH DAILY.  Marland Kitchen aspirin EC 81 MG tablet Take 1 tablet (81 mg total) by mouth daily.  Marland Kitchen atorvastatin (LIPITOR) 40 MG tablet Take 1 tablet (40 mg total) by mouth daily at 6 PM.  . chlorthalidone (HYGROTON) 25 MG tablet Take 2 tablets (50 mg total) by mouth daily.  . cloNIDine (CATAPRES) 0.1 MG tablet Take 1 tablet (0.1 mg total) by mouth 3 (three) times daily as needed (sbp >160).  . hydrALAZINE (APRESOLINE) 100 MG tablet Take 1 tablet (100 mg total) by mouth every 8 (eight) hours.  . metoprolol tartrate (LOPRESSOR) 50 MG tablet Take 1 tablet (50 mg total) by mouth 2 (two) times daily.  . nitroGLYCERIN (NITROSTAT) 0.4 MG SL tablet Place 1 tablet (0.4 mg total) under the tongue every 5 (five) minutes x 3 doses as needed for chest pain.  . potassium chloride (K-DUR) 10 MEQ tablet Take 1 tablet (10 mEq total) by mouth daily.  Marland Kitchen spironolactone (ALDACTONE) 25 MG tablet Take 1 tablet (25 mg total) by mouth daily.  . [DISCONTINUED] chlorthalidone (HYGROTON) 25 MG tablet Take 1 tablet (25 mg total) by mouth daily.     Allergies:   Fish allergy; Peanuts [peanut oil]; Other; Penicillins; and Vancomycin   Social History   Social History  . Marital status: Single    Spouse name: N/A  . Number of children: N/A  . Years of education: N/A   Social  History Main Topics  . Smoking status: Current Every Day Smoker    Packs/day: 1.00    Years: 26.00    Types: Cigarettes  . Smokeless tobacco: Never Used  . Alcohol use No  . Drug use: No  . Sexual activity: No   Other Topics Concern  . None   Social History Narrative   Single.  Lives with daugher and grandchildren.     Family History:  The patient's family history includes Breast cancer in her mother; Cancer in her mother; Hypertension in her father; Thyroid disease in her daughter.   ROS:   Please see the history of present illness.    Review of Systems  Constitution: Negative.  HENT: Negative.   Eyes:  Negative.   Cardiovascular: Negative.   Respiratory: Negative.   Hematologic/Lymphatic: Negative.   Musculoskeletal: Negative.  Negative for joint pain.       Swelling and bruising at cath site with pain  Gastrointestinal: Negative.   Genitourinary: Negative.   Neurological: Negative.    All other systems reviewed and are negative.   PHYSICAL EXAM:   VS:  BP (!) 170/104 (BP Location: Right Arm, Patient Position: Sitting, Cuff Size: Normal)   Pulse 86   Ht 4\' 11"  (1.499 m)   Wt 191 lb (86.6 kg)   SpO2 99%   BMI 38.58 kg/m   Physical Exam  GEN: Well nourished, well developed, in no acute distress  Neck: no JVD, carotid bruits, or masses Cardiac:RRR; no murmurs, rubs, or gallops  Respiratory:  clear to auscultation bilaterally, normal work of breathing GI: soft, nontender, nondistended, + BS Ext: Right arm with bruising and swelling but good radial and brachial pulses without hematoma, lower extremities without cyanosis, clubbing, or edema, Good distal pulses bilaterally Neuro:  Alert and Oriented x 3, Strength and sensation are intact Psych: euthymic mood, full affect  Wt Readings from Last 3 Encounters:  08/24/17 191 lb (86.6 kg)  08/10/17 185 lb 14.4 oz (84.3 kg)  07/29/17 190 lb (86.2 kg)      Studies/Labs Reviewed:   EKG:  EKG is not ordered today.    Recent Labs: 08/09/2017: ALT 12; BUN 16; Creatinine, Ser 0.86; Hemoglobin 13.9; Platelets 308; Potassium 3.6; Sodium 136   Lipid Panel    Component Value Date/Time   CHOL 185 10/25/2013 1051   TRIG 153 (H) 10/25/2013 1051   HDL 36 (L) 10/25/2013 1051   CHOLHDL 5.1 10/25/2013 1051   VLDL 31 10/25/2013 1051   LDLCALC 118 (H) 10/25/2013 1051   Cardiac catheterization 08/10/17 Conclusion     Mid LAD lesion, 20 %stenosed.  Mid RCA lesion, 20 %stenosed.  2nd Diag lesion, 10 %stenosed.  The left ventricular systolic function is normal.  LV end diastolic pressure is normal.  The left ventricular ejection  fraction is 55-65% by visual estimate.  There is no aortic valve stenosis.   Nonobstructive coronary artery disease.      Nuclear stress test 08/08/17  There was no ST segment deviation noted during stress.  Defect 1: There is a medium defect of mild severity present in the basal inferolateral, mid inferolateral and apical lateral location.  Findings consistent with ischemia.  This is an intermediate risk study.  Nuclear stress EF: 44%.   Intermediate risk stress nuclear study with inferolateral ischemia and mildly depressed left ventricular systolic function.      Additional studies/ records that were reviewed today include:   Echo 07/2017 Study Conclusions   - Left ventricle: Systolic  function was vigorous. The estimated   ejection fraction was 65%. Wall motion was normal; there were no   regional wall motion abnormalities. Features are consistent with   a pseudonormal left ventricular filling pattern, with concomitant   abnormal relaxation and increased filling pressure (grade 2   diastolic dysfunction). Doppler parameters are consistent with   high ventricular filling pressure. - Mitral valve: There was mild regurgitation.      ASSESSMENT:    1. Malignant hypertension   2. Abnormal stress test   3. Hyperlipidemia, unspecified hyperlipidemia type   4. SMOKER      PLAN:  In order of problems listed above:  Malignant hypertension Blood pressure quite high today but she hasn't taken any medication yet, and it has been elevated while on medications. Will increase chlorthalidone to 50 mg once daily. Have her follow-up here next week with the pharmacist for further medication titration. Also f/u with primary care.Follow-up with Dr.McAlhany in 2-3 months.   Abnormal nuclear stress test which led to cardiac catheterization with nonobstructive disease. Bruising and swelling at cath site but no significant hematoma or widened radial pulse on exam. Recommend keeping it  elevated, no heavy lifting or strenuous activity. Call if worsens.  Hyperlipidemia on Lipitor. Will need fasting lipid panel and LFTs in 6 weeks.  Patient is a smoker but is trying to quit.    Medication Adjustments/Labs and Tests Ordered: Current medicines are reviewed at length with the patient today.  Concerns regarding medicines are outlined above.  Medication changes, Labs and Tests ordered today are listed in the Patient Instructions below. Patient Instructions  Medication Instructions: Your physician has recommended you make the following change in your medication:  -1) INCREASE Chlorthalidone (Hygronton) - Take 2 tablets (50 mg) by mouth daily  Labwork: Your physician recommends that you return for lab work in: [place] for FASTING lipids and Hepatic Function Test  Procedures/Testing: None Ordered  Follow-Up: Your physician recommends that you schedule a follow-up appointment in: 1 WEEK with a Pharmacists  Your physician recommends that you schedule a follow-up appointment in: 2-3 MONTHS with Dr. Angelena Form  Any Additional Special Instructions Will Be Listed Below (If Applicable).   DASH Eating Plan DASH stands for "Dietary Approaches to Stop Hypertension." The DASH eating plan is a healthy eating plan that has been shown to reduce high blood pressure (hypertension). It may also reduce your risk for type 2 diabetes, heart disease, and stroke. The DASH eating plan may also help with weight loss. What are tips for following this plan? General guidelines  Avoid eating more than 2,000 mg (milligrams) of salt (sodium) a day. If you have hypertension, you may need to reduce your sodium intake to 1,500 mg a day.  Limit alcohol intake to no more than 1 drink a day for nonpregnant women and 2 drinks a day for men. One drink equals 12 oz of beer, 5 oz of wine, or 1 oz of hard liquor.  Work with your health care provider to maintain a healthy body weight or to lose weight. Ask what  an ideal weight is for you.  Get at least 30 minutes of exercise that causes your heart to beat faster (aerobic exercise) most days of the week. Activities may include walking, swimming, or biking.  Work with your health care provider or diet and nutrition specialist (dietitian) to adjust your eating plan to your individual calorie needs. Reading food labels  Check food labels for the amount of sodium per serving.  Choose foods with less than 5 percent of the Daily Value of sodium. Generally, foods with less than 300 mg of sodium per serving fit into this eating plan.  To find whole grains, look for the word "whole" as the first word in the ingredient list. Shopping  Buy products labeled as "low-sodium" or "no salt added."  Buy fresh foods. Avoid canned foods and premade or frozen meals. Cooking  Avoid adding salt when cooking. Use salt-free seasonings or herbs instead of table salt or sea salt. Check with your health care provider or pharmacist before using salt substitutes.  Do not fry foods. Cook foods using healthy methods such as baking, boiling, grilling, and broiling instead.  Cook with heart-healthy oils, such as olive, canola, soybean, or sunflower oil. Meal planning   Eat a balanced diet that includes: ? 5 or more servings of fruits and vegetables each day. At each meal, try to fill half of your plate with fruits and vegetables. ? Up to 6-8 servings of whole grains each day. ? Less than 6 oz of lean meat, poultry, or fish each day. A 3-oz serving of meat is about the same size as a deck of cards. One egg equals 1 oz. ? 2 servings of low-fat dairy each day. ? A serving of nuts, seeds, or beans 5 times each week. ? Heart-healthy fats. Healthy fats called Omega-3 fatty acids are found in foods such as flaxseeds and coldwater fish, like sardines, salmon, and mackerel.  Limit how much you eat of the following: ? Canned or prepackaged foods. ? Food that is high in trans fat,  such as fried foods. ? Food that is high in saturated fat, such as fatty meat. ? Sweets, desserts, sugary drinks, and other foods with added sugar. ? Full-fat dairy products.  Do not salt foods before eating.  Try to eat at least 2 vegetarian meals each week.  Eat more home-cooked food and less restaurant, buffet, and fast food.  When eating at a restaurant, ask that your food be prepared with less salt or no salt, if possible. What foods are recommended? The items listed may not be a complete list. Talk with your dietitian about what dietary choices are best for you. Grains Whole-grain or whole-wheat bread. Whole-grain or whole-wheat pasta. Brown rice. Modena Morrow. Bulgur. Whole-grain and low-sodium cereals. Pita bread. Low-fat, low-sodium crackers. Whole-wheat flour tortillas. Vegetables Fresh or frozen vegetables (raw, steamed, roasted, or grilled). Low-sodium or reduced-sodium tomato and vegetable juice. Low-sodium or reduced-sodium tomato sauce and tomato paste. Low-sodium or reduced-sodium canned vegetables. Fruits All fresh, dried, or frozen fruit. Canned fruit in natural juice (without added sugar). Meat and other protein foods Skinless chicken or [place]. Ground chicken or [place]. Pork with fat trimmed off. Fish and seafood. Egg whites. Dried beans, peas, or lentils. Unsalted nuts, nut butters, and seeds. Unsalted canned beans. Lean cuts of beef with fat trimmed off. Low-sodium, lean deli meat. Dairy Low-fat (1%) or fat-free (skim) milk. Fat-free, low-fat, or reduced-fat cheeses. Nonfat, low-sodium ricotta or cottage cheese. Low-fat or nonfat yogurt. Low-fat, low-sodium cheese. Fats and oils Soft margarine without trans fats. Vegetable oil. Low-fat, reduced-fat, or light mayonnaise and salad dressings (reduced-sodium). Canola, safflower, olive, soybean, and sunflower oils. Avocado. Seasoning and other foods Herbs. Spices. Seasoning mixes without salt. Unsalted popcorn and  pretzels. Fat-free sweets. What foods are not recommended? The items listed may not be a complete list. Talk with your dietitian about what dietary choices are best for you. Grains Baked  goods made with fat, such as croissants, muffins, or some breads. Dry pasta or rice meal packs. Vegetables Creamed or fried vegetables. Vegetables in a cheese sauce. Regular canned vegetables (not low-sodium or reduced-sodium). Regular canned tomato sauce and paste (not low-sodium or reduced-sodium). Regular tomato and vegetable juice (not low-sodium or reduced-sodium). Angie Fava. Olives. Fruits Canned fruit in a light or heavy syrup. Fried fruit. Fruit in cream or butter sauce. Meat and other protein foods Fatty cuts of meat. Ribs. Fried meat. Berniece Salines. Sausage. Bologna and other processed lunch meats. Salami. Fatback. Hotdogs. Bratwurst. Salted nuts and seeds. Canned beans with added salt. Canned or smoked fish. Whole eggs or egg yolks. Chicken or [place] with skin. Dairy Whole or 2% milk, cream, and half-and-half. Whole or full-fat cream cheese. Whole-fat or sweetened yogurt. Full-fat cheese. Nondairy creamers. Whipped toppings. Processed cheese and cheese spreads. Fats and oils Butter. Stick margarine. Lard. Shortening. Ghee. Bacon fat. Tropical oils, such as coconut, palm kernel, or palm oil. Seasoning and other foods Salted popcorn and pretzels. Onion salt, garlic salt, seasoned salt, table salt, and sea salt. Worcestershire sauce. Tartar sauce. Barbecue sauce. Teriyaki sauce. Soy sauce, including reduced-sodium. Steak sauce. Canned and packaged gravies. Fish sauce. Oyster sauce. Cocktail sauce. Horseradish that you find on the shelf. Ketchup. Mustard. Meat flavorings and tenderizers. Bouillon cubes. Hot sauce and Tabasco sauce. Premade or packaged marinades. Premade or packaged taco seasonings. Relishes. Regular salad dressings. Where to find more information:  National Heart, Lung, and [place]:  https://wilson-eaton.com/  American Heart Association: www.heart.org Summary  The DASH eating plan is a healthy eating plan that has been shown to reduce high blood pressure (hypertension). It may also reduce your risk for type 2 diabetes, heart disease, and stroke.  With the DASH eating plan, you should limit salt (sodium) intake to 2,000 mg a day. If you have hypertension, you may need to reduce your sodium intake to 1,500 mg a day.  When on the DASH eating plan, aim to eat more fresh fruits and vegetables, whole grains, lean proteins, low-fat dairy, and heart-healthy fats.  Work with your health care provider or diet and nutrition specialist (dietitian) to adjust your eating plan to your individual calorie needs. This information is not intended to replace advice given to you by your health care provider. Make sure you discuss any questions you have with your health care provider. Document Released: 10/30/2011 Document Revised: 11/03/2016 Document Reviewed: 11/03/2016 Elsevier Interactive Patient Education  2017 [employer].    If you need a refill on your cardiac medications before your next appointment, please call your pharmacy.      Sumner Boast, PA-C  08/24/2017 12:33 PM    [facility] [address], [place], [place]  [address] Phone: [phone]; Fax: [phone]

## 2017-09-03 ENCOUNTER — Ambulatory Visit: Payer: Self-pay

## 2017-09-03 NOTE — Progress Notes (Deleted)
Patient ID: ANEDRA PENAFIEL                 DOB: Mar 28, 1966                      MRN: [id number]     HPI: Tracie Estrada is a 51 y.o. female referred by Gerrianne Scale PA-C to HTN clinic. PMH includes hypertension, chest pain, non-obstructive CAD and normal EF 55-65%.  During most recent office visit on 08/24/2017 chlorthalidone was increased from 25mg  daily to 50mg  daily.   Current HTN meds:  Amlodipine 10mg  daily Chlorthalidone 50mg  daily Clonidine 0.1mg  TID as needed for SBP >160 Hydralazine 100mg  every 8 hours Metoprolol 50mg  twice daily Spironolactone 25mg  daily  Previously tried: Bisoprolol/HCTZ 5-6.25mg  daily Lisinopril  labetalol   BP goal: <130/80  Family History: family history includes Breast cancer in her mother; Hypertension in her father; Thyroid disease in her daughter.   Social History: current smoker, denies alcohol intake or any other illicit drugs  Diet:   Exercise:   Home BP readings:   Wt Readings from Last 3 Encounters:  08/24/17 191 lb (86.6 kg)  08/10/17 185 lb 14.4 oz (84.3 kg)  07/29/17 190 lb (86.2 kg)   BP Readings from Last 3 Encounters:  08/24/17 (!) 170/104  08/19/17 (!) 166/89  08/11/17 (!) 153/81   Pulse Readings from Last 3 Encounters:  08/24/17 86  08/19/17 89  08/11/17 68    Renal function: CrCl cannot be calculated (Patient's most recent lab result is older than the maximum 21 days allowed.).  Past Medical History:  Diagnosis Date  . Acute renal failure ([place]) 10/13/2013   On Vancomycin  . Breast abscess of female    Status post biopsy  . Hypertension   . Malignant hypertension 10/13/2013    Current Outpatient Prescriptions on File Prior to Visit  Medication Sig Dispense Refill  . amLODipine (NORVASC) 10 MG tablet TAKE 1 TABLET (10 MG TOTAL) BY MOUTH DAILY. 30 tablet 5  . aspirin EC 81 MG tablet Take 1 tablet (81 mg total) by mouth daily. 90 tablet 3  . atorvastatin (LIPITOR) 40 MG tablet Take 1 tablet (40 mg total) by mouth daily  at 6 PM. 30 tablet 0  . chlorthalidone (HYGROTON) 25 MG tablet Take 2 tablets (50 mg total) by mouth daily. 60 tablet 6  . cloNIDine (CATAPRES) 0.1 MG tablet Take 1 tablet (0.1 mg total) by mouth 3 (three) times daily as needed (sbp >160). 60 tablet 11  . hydrALAZINE (APRESOLINE) 100 MG tablet Take 1 tablet (100 mg total) by mouth every 8 (eight) hours. 90 tablet 0  . metoprolol tartrate (LOPRESSOR) 50 MG tablet Take 1 tablet (50 mg total) by mouth 2 (two) times daily. 180 tablet 3  . nitroGLYCERIN (NITROSTAT) 0.4 MG SL tablet Place 1 tablet (0.4 mg total) under the tongue every 5 (five) minutes x 3 doses as needed for chest pain. 30 tablet 0  . potassium chloride (K-DUR) 10 MEQ tablet Take 1 tablet (10 mEq total) by mouth daily. 30 tablet 3  . spironolactone (ALDACTONE) 25 MG tablet Take 1 tablet (25 mg total) by mouth daily. 30 tablet 11  . triamcinolone ointment (KENALOG) 0.5 % APPLY 1 APPLICATION TOPICALLY 2  TWO  TIMES DAILY  1  . [DISCONTINUED] bisoprolol-hydrochlorothiazide (ZIAC) 5-6.25 MG tablet Take 1 tablet by mouth daily. 30 tablet 0   No current facility-administered medications on file prior to visit.  Allergies  Allergen Reactions  . Fish Allergy Anaphylaxis  . Peanuts [Peanut Oil] Anaphylaxis  . Other Other (See Comments)    Anesthesia.  Specific agent unknown.  Reaction unknown.   Marland Kitchen Penicillins Itching  . Vancomycin Other (See Comments)    Nephrotoxicity    There were no vitals taken for this visit.  No problem-specific Assessment & Plan notes found for this encounter.   Noreene Boreman Rodriguez-Guzman PharmD, BCPS, [place] [address] 09/03/2017 1:19 PM

## 2017-09-22 DIAGNOSIS — R319 Hematuria, unspecified: Secondary | ICD-10-CM

## 2017-10-05 ENCOUNTER — Other Ambulatory Visit: Payer: Self-pay

## 2018-03-14 ENCOUNTER — Emergency Department (HOSPITAL_COMMUNITY)
Admission: EM | Admit: 2018-03-14 | Discharge: 2018-03-15 | Disposition: A | Discharge status: Home / Self Care | Payer: Self-pay | Attending: Emergency Medicine | Admitting: Emergency Medicine

## 2018-03-14 ENCOUNTER — Other Ambulatory Visit: Payer: Self-pay

## 2018-03-14 ENCOUNTER — Emergency Department (HOSPITAL_COMMUNITY): Payer: Self-pay

## 2018-03-14 ENCOUNTER — Encounter (HOSPITAL_COMMUNITY): Payer: Self-pay

## 2018-03-14 DIAGNOSIS — K1120 Sialoadenitis, unspecified: Secondary | ICD-10-CM | POA: Insufficient documentation

## 2018-03-14 DIAGNOSIS — Z91010 Allergy to peanuts: Secondary | ICD-10-CM | POA: Insufficient documentation

## 2018-03-14 DIAGNOSIS — I10 Essential (primary) hypertension: Secondary | ICD-10-CM

## 2018-03-14 DIAGNOSIS — F17210 Nicotine dependence, cigarettes, uncomplicated: Secondary | ICD-10-CM | POA: Insufficient documentation

## 2018-03-14 DIAGNOSIS — R6883 Chills (without fever): Secondary | ICD-10-CM | POA: Insufficient documentation

## 2018-03-14 DIAGNOSIS — Z7982 Long term (current) use of aspirin: Secondary | ICD-10-CM | POA: Insufficient documentation

## 2018-03-14 DIAGNOSIS — N189 Chronic kidney disease, unspecified: Secondary | ICD-10-CM | POA: Insufficient documentation

## 2018-03-14 DIAGNOSIS — I129 Hypertensive chronic kidney disease with stage 1 through stage 4 chronic kidney disease, or unspecified chronic kidney disease: Secondary | ICD-10-CM | POA: Insufficient documentation

## 2018-03-14 DIAGNOSIS — Z79899 Other long term (current) drug therapy: Secondary | ICD-10-CM | POA: Insufficient documentation

## 2018-03-14 LAB — CBC WITH DIFFERENTIAL/PLATELET
Basophils Absolute: 0 10*3/uL (ref 0.0–0.1)
Basophils Relative: 0 %
EOS ABS: 0.1 10*3/uL (ref 0.0–0.7)
EOS PCT: 0 %
HCT: 41.7 % (ref 36.0–46.0)
HEMOGLOBIN: 14.4 g/dL (ref 12.0–15.0)
LYMPHS ABS: 1.7 10*3/uL (ref 0.7–4.0)
Lymphocytes Relative: 13 %
MCH: 29 pg (ref 26.0–34.0)
MCHC: 34.5 g/dL (ref 30.0–36.0)
MCV: 83.9 fL (ref 78.0–100.0)
MONOS PCT: 6 %
Monocytes Absolute: 0.8 10*3/uL (ref 0.1–1.0)
Neutro Abs: 10.8 10*3/uL — ABNORMAL HIGH (ref 1.7–7.7)
Neutrophils Relative %: 81 %
PLATELETS: 297 10*3/uL (ref 150–400)
RBC: 4.97 MIL/uL (ref 3.87–5.11)
RDW: 15.6 % — ABNORMAL HIGH (ref 11.5–15.5)
WBC: 13.3 10*3/uL — ABNORMAL HIGH (ref 4.0–10.5)

## 2018-03-14 LAB — URINALYSIS, ROUTINE W REFLEX MICROSCOPIC
BACTERIA UA: NONE SEEN
Bilirubin Urine: NEGATIVE
Glucose, UA: NEGATIVE mg/dL
Hgb urine dipstick: NEGATIVE
Ketones, ur: NEGATIVE mg/dL
LEUKOCYTES UA: NEGATIVE
Nitrite: NEGATIVE
PH: 6 (ref 5.0–8.0)
Protein, ur: NEGATIVE mg/dL

## 2018-03-14 LAB — COMPREHENSIVE METABOLIC PANEL
ALK PHOS: 75 U/L (ref 38–126)
ALT: 14 U/L (ref 14–54)
AST: 16 U/L (ref 15–41)
Albumin: 4 g/dL (ref 3.5–5.0)
Anion gap: 11 (ref 5–15)
BUN: 11 mg/dL (ref 6–20)
CALCIUM: 9.1 mg/dL (ref 8.9–10.3)
CO2: 26 mmol/L (ref 22–32)
CREATININE: 1.01 mg/dL — AB (ref 0.44–1.00)
Chloride: 102 mmol/L (ref 101–111)
Glucose, Bld: 116 mg/dL — ABNORMAL HIGH (ref 65–99)
Potassium: 3.2 mmol/L — ABNORMAL LOW (ref 3.5–5.1)
SODIUM: 139 mmol/L (ref 135–145)
Total Bilirubin: 0.4 mg/dL (ref 0.3–1.2)
Total Protein: 8 g/dL (ref 6.5–8.1)

## 2018-03-14 LAB — I-STAT TROPONIN, ED: TROPONIN I, POC: 0.05 ng/mL (ref 0.00–0.08)

## 2018-03-14 LAB — LIPASE, BLOOD: LIPASE: 27 U/L (ref 11–51)

## 2018-03-14 LAB — I-STAT CG4 LACTIC ACID, ED: LACTIC ACID, VENOUS: 0.93 mmol/L (ref 0.5–1.9)

## 2018-03-14 MED ORDER — DEXAMETHASONE SODIUM PHOSPHATE 10 MG/ML IJ SOLN
10.0000 mg | Freq: Once | INTRAMUSCULAR | Status: AC
  Administered 2018-03-14: 10 mg via INTRAVENOUS
  Filled 2018-03-14: qty 1

## 2018-03-14 MED ORDER — LABETALOL HCL 5 MG/ML IV SOLN
10.0000 mg | Freq: Once | INTRAVENOUS | Status: AC
Start: 2018-03-14 — End: 2018-03-14
  Administered 2018-03-14: 10 mg via INTRAVENOUS
  Filled 2018-03-14: qty 4

## 2018-03-14 MED ORDER — ACETAMINOPHEN 500 MG PO TABS
1000.0000 mg | ORAL_TABLET | Freq: Once | ORAL | Status: AC
  Administered 2018-03-14: 1000 mg via ORAL
  Filled 2018-03-14: qty 2

## 2018-03-14 MED ORDER — IOPAMIDOL (ISOVUE-300) INJECTION 61%
80.0000 mL | Freq: Once | INTRAVENOUS | Status: AC | PRN
  Administered 2018-03-14: 80 mL via INTRAVENOUS

## 2018-03-14 MED ORDER — CLINDAMYCIN HCL 300 MG PO CAPS: 300.0000 mg | ORAL_CAPSULE | Freq: Four times a day (QID) | ORAL | 0 refills | Status: AC

## 2018-03-14 MED ORDER — SODIUM CHLORIDE 0.9 % IV BOLUS
1000.0000 mL | Freq: Once | INTRAVENOUS | Status: AC
  Administered 2018-03-14: 1000 mL via INTRAVENOUS

## 2018-03-14 MED ORDER — LORAZEPAM 2 MG/ML IJ SOLN
1.0000 mg | Freq: Once | INTRAMUSCULAR | Status: AC
  Administered 2018-03-14: 1 mg via INTRAVENOUS
  Filled 2018-03-14: qty 1

## 2018-03-14 MED ORDER — MORPHINE SULFATE (PF) 4 MG/ML IV SOLN
4.0000 mg | Freq: Once | INTRAVENOUS | Status: DC
  Filled 2018-03-14: qty 1

## 2018-03-14 MED ORDER — CLINDAMYCIN PHOSPHATE 600 MG/50ML IV SOLN
600.0000 mg | Freq: Once | INTRAVENOUS | Status: AC
  Administered 2018-03-14: 600 mg via INTRAVENOUS
  Filled 2018-03-14: qty 50

## 2018-03-14 MED ORDER — CLONIDINE HCL 0.1 MG PO TABS
0.2000 mg | ORAL_TABLET | Freq: Once | ORAL | Status: AC
Start: 2018-03-14 — End: 2018-03-14
  Administered 2018-03-14: 0.2 mg via ORAL
  Filled 2018-03-14: qty 2

## 2018-03-14 MED ORDER — IBUPROFEN 800 MG PO TABS
800.0000 mg | ORAL_TABLET | Freq: Once | ORAL | Status: AC
  Administered 2018-03-14: 800 mg via ORAL
  Filled 2018-03-14: qty 1

## 2018-03-14 MED ORDER — KETOROLAC TROMETHAMINE 30 MG/ML IJ SOLN: 30.0000 mg | Freq: Once | INTRAMUSCULAR | Status: DC

## 2018-03-14 MED ORDER — HYDRALAZINE HCL 20 MG/ML IJ SOLN
10.0000 mg | Freq: Once | INTRAMUSCULAR | Status: AC
  Administered 2018-03-14: 10 mg via INTRAVENOUS
  Filled 2018-03-14: qty 1

## 2018-03-14 NOTE — ED Triage Notes (Signed)
Pt staets generalized body aches. 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 Swelling in the area. Pt states she has also been more tired than normal. Pt states "her whole insides hurt"

## 2018-03-14 NOTE — ED Notes (Signed)
Pt reports no change in symptoms at this time. Dr.Yao has ordered additional medication to aid with blood pressure reduction.

## 2018-03-14 NOTE — ED Notes (Signed)
Dr.Yao notified of pt blood pressure at this time.

## 2018-03-14 NOTE — Discharge Instructions (Addendum)
Take clindamycin four times daily for a week.   Take your blood pressure medicines as prescribed.   Take tylenol, motrin for fevers.   See ENT for follow up. You may have ear lobe infection or infection of your parotid gland   See your doctor in a week to recheck blood pressure.   Return to ER if you have worse neck swelling, fever for a week, trouble breathing, vomiting, trouble swallowing.

## 2018-03-14 NOTE — ED Provider Notes (Addendum)
[facility] DEPT Provider Note   CSN: [id number] Arrival date & time: 03/14/18  1700     History   Chief Complaint Chief Complaint  Patient presents with  . Generalized Body Aches    HPI Tracie Estrada is a 52 y.o. female history of hypertension, here presenting with chills, right neck pain, cough.  Patient states that several days ago, neighbors child has accidentally scratched her right earlobe.  Yesterday she went to get her here done and got some water in the right ear.  Since last night, patient had progressive right ear pain and noticed swelling on the right side of her neck.  Denies trouble swallowing and denies trouble breathing.  Also has subjective chills but did not take her temperature today.  Denies any abdominal pain or vomiting or trouble urination.  Denies any sick contacts.  The history is provided by the patient.    Past Medical History:  Diagnosis Date  . Acute renal failure ([place]) 10/13/2013   On Vancomycin  . Breast abscess of female    Status post biopsy  . Hypertension   . Malignant hypertension 10/13/2013    Patient Active Problem List   Diagnosis Date Noted  . Hematuria   . Abnormal stress test   . Elevated troponin   . Tobacco abuse   . Hypertensive urgency 08/06/2017  . Chest pain 08/06/2017  . AKI (acute kidney injury) ([place]) 08/06/2017  . Hypertension   . Cough 11/20/2016  . Accelerated hypertension 10/25/2013  . Chronic kidney disease 10/25/2013  . Breast abscess of female 10/25/2013  . Malignant hypertension 10/13/2013  . Acute renal failure ([place]) 10/13/2013  . Left breast abscess 10/10/2013  . Mastitis 10/10/2013  . Discharge of breast 09/27/2013  . Breast mass, left 09/27/2013  . Hyperlipidemia 08/06/2010  . ANXIETY 08/06/2010  . SMOKER 08/06/2010  . Essential hypertension, benign 08/06/2010    Past Surgical History:  Procedure Laterality Date  . Biopsy of left breast    . INCISION AND DRAINAGE  ABSCESS Left 10/11/2013   Procedure: INCISION AND DRAINAGE and debridement LEFT BREAST ABSCESS;  Surgeon: Odis Hollingshead, MD;  Location: [facility];  Service: General;  Laterality: Left;  . LEFT HEART CATH AND CORONARY ANGIOGRAPHY N/A 08/10/2017   Procedure: LEFT HEART CATH AND CORONARY ANGIOGRAPHY;  Surgeon: Jettie Booze, MD;  Location: [facility];  Service: Cardiovascular;  Laterality: N/A;     OB History    Gravida  1   Para  1   Term  1   Preterm      AB      Living  1     SAB      TAB      Ectopic      Multiple      Live Births               Home Medications    Prior to Admission medications   Medication Sig Start Date End Date Taking? Authorizing Provider  amLODipine (NORVASC) 10 MG tablet TAKE 1 TABLET (10 MG TOTAL) BY MOUTH DAILY. 07/29/17   Tresa Garter, MD  aspirin EC 81 MG tablet Take 1 tablet (81 mg total) by mouth daily. 11/20/16   Tresa Garter, MD  atorvastatin (LIPITOR) 40 MG tablet Take 1 tablet (40 mg total) by mouth daily at 6 PM. 08/11/17   Jani Gravel, MD  chlorthalidone (HYGROTON) 25 MG tablet Take 2 tablets (50 mg total) by  mouth daily. 08/24/17   Imogene Burn, PA-C  cloNIDine (CATAPRES) 0.1 MG tablet Take 1 tablet (0.1 mg total) by mouth 3 (three) times daily as needed (sbp >160). 08/11/17   Jani Gravel, MD  hydrALAZINE (APRESOLINE) 100 MG tablet Take 1 tablet (100 mg total) by mouth every 8 (eight) hours. 08/11/17   Jani Gravel, MD  metoprolol tartrate (LOPRESSOR) 50 MG tablet Take 1 tablet (50 mg total) by mouth 2 (two) times daily. 07/29/17   Tresa Garter, MD  nitroGLYCERIN (NITROSTAT) 0.4 MG SL tablet Place 1 tablet (0.4 mg total) under the tongue every 5 (five) minutes x 3 doses as needed for chest pain. 08/11/17   Jani Gravel, MD  potassium chloride (K-DUR) 10 MEQ tablet Take 1 tablet (10 mEq total) by mouth daily. 07/29/17   Tresa Garter, MD  spironolactone (ALDACTONE) 25 MG tablet Take 1 tablet (25 mg  total) by mouth daily. 08/11/17 08/11/18  Jani Gravel, MD  triamcinolone ointment (KENALOG) 0.5 % APPLY 1 APPLICATION TOPICALLY 2  TWO  TIMES DAILY 08/21/17   [provider]  bisoprolol-hydrochlorothiazide (ZIAC) 5-6.25 MG tablet Take 1 tablet by mouth daily. 08/25/16 09/04/16  Orlie Dakin, MD    Family History Family History  Problem Relation Age of Onset  . Breast cancer Mother   . Cancer Mother        brain  . Hypertension Father   . Thyroid disease Daughter     Social History Social History   Tobacco Use  . Smoking status: Current Every Day Smoker    Packs/day: 1.00    Years: 26.00    Pack years: 26.00    Types: Cigarettes  . Smokeless tobacco: Never Used  Substance Use Topics  . Alcohol use: No  . Drug use: No     Allergies   Fish allergy; Peanuts [peanut oil]; Other; Penicillins; and Vancomycin   Review of Systems Review of Systems  Constitutional: Positive for chills.  HENT:       R neck pain   Skin: Positive for rash.  All other systems reviewed and are negative.    Physical Exam Updated Vital Signs BP (!) 187/98 (BP Location: Left Arm)   Pulse 93   Temp 99.6 F (37.6 C) (Oral)   Resp (!) 27   Ht 4\' 11"  (1.499 m)   Wt 86.2 kg (190 lb)   LMP 11/13/2017   SpO2 95%   BMI 38.38 kg/m   Physical Exam  Constitutional: She is oriented to person, place, and time.  Uncomfortable   HENT:  Head: Normocephalic.  R TM nl, no obvious otitis externa. Small abrasion R ear lobe. There is tender cervical lymph nodes vs salivary gland. No obvious swelling around the parotid. Uvula midline, OP not red and tonsils not swollen   Eyes: Pupils are equal, round, and reactive to light. Conjunctivae and EOM are normal.  Neck: Normal range of motion.  R cervical LAD. No meningeal signs   Cardiovascular: Regular rhythm and normal heart sounds.  Tachycardic   Pulmonary/Chest: Effort normal and breath sounds normal. No stridor. No respiratory distress. She has  no wheezes.  Abdominal: Soft. Bowel sounds are normal. She exhibits no distension. There is no tenderness. There is no guarding.  Musculoskeletal: Normal range of motion.  Neurological: She is alert and oriented to person, place, and time.  Skin: Skin is warm.  Psychiatric: She has a normal mood and affect.  Nursing note and vitals reviewed.    ED  Treatments / Results  Labs (all labs ordered are listed, but only abnormal results are displayed) Labs Reviewed  CBC WITH DIFFERENTIAL/PLATELET - Abnormal; Notable for the following components:      Result Value   WBC 13.3 (*)    RDW 15.6 (*)    Neutro Abs 10.8 (*)    All other components within normal limits  COMPREHENSIVE METABOLIC PANEL - Abnormal; Notable for the following components:   Potassium 3.2 (*)    Glucose, Bld 116 (*)    Creatinine, Ser 1.01 (*)    All other components within normal limits  URINE CULTURE  CULTURE, BLOOD (ROUTINE X 2)  CULTURE, BLOOD (ROUTINE X 2)  LIPASE, BLOOD  URINALYSIS, ROUTINE W REFLEX MICROSCOPIC  I-STAT TROPONIN, ED  I-STAT CG4 LACTIC ACID, ED    EKG None  Radiology Dg Chest 2 View  Result Date: 03/14/2018 CLINICAL DATA:  Fever, chills, shortness of breath EXAM: CHEST - 2 VIEW COMPARISON:  08/06/2017 FINDINGS: Cardiomegaly. No confluent airspace opacities or effusions. No acute bony abnormality. IMPRESSION: Cardiomegaly.  No active disease. Electronically Signed   By: Rolm Baptise M.D.   On: 03/14/2018 19:27   Ct Soft Tissue Neck W Contrast  Result Date: 03/14/2018 CLINICAL DATA:  Laryngitis. Generalized body aches. Right ear swelling. EXAM: CT NECK WITH CONTRAST TECHNIQUE: Multidetector CT imaging of the neck was performed using the standard protocol following the bolus administration of intravenous contrast. CONTRAST:  33mL ISOVUE-300 IOPAMIDOL (ISOVUE-300) INJECTION 61% COMPARISON:  None. FINDINGS: Pharynx and larynx: Symmetric pharyngeal soft tissues without evidence of mass or focal  swelling. No epiglottic edema. No significant inflammatory changes or fluid collection in the parapharyngeal or retropharyngeal spaces. Mild motion artifact through the larynx. Salivary glands: There is mild subcutaneous fat stranding overlying the right parotid gland, however the right parotid gland itself appears relatively symmetric compared to the contralateral side. There may be minimal right parotid space stranding at the level of the parotid tail. The submandibular glands are unremarkable. Thyroid: Symmetric mild prominence of both thyroid lobes without focal abnormality evident. Lymph nodes: Small right periparotid lymph nodes are noted in the area indicated by the skin marker and measure up to 6 mm in size, larger than those on the contralateral side. Right level II lymph nodes measure up to 10 mm in short axis and are also asymmetrically enlarged compared to the contralateral side subcentimeter but asymmetric right-sided lymph nodes are also noted in levels III and V. Vascular: Major vascular structures of the neck are patent. Limited intracranial: Only the very inferior most aspect of the posterior fossa was imaged and is grossly unremarkable. Visualized orbits: Unremarkable. Mastoids and visualized paranasal sinuses: Visualized mastoid air cells are clear. Partially visualized mucosal thickening in the nasal cavity including in the region of the ostiomeatal complexes. Minimal left maxillary sinus mucosal thickening without fluid. Skeleton: Focally advanced disc degeneration at C3-4 with severe disc space narrowing, vertebral sclerosis, and spurring. Upper chest: Clear lung apices. Other: Moderate swelling involving the lobule of the right ear with skin thickening and subcutaneous fat stranding extending inferiorly superficial to the right parotid gland. IMPRESSION: 1. Swelling of the lobule of the right ear with adjacent skin thickening and subcutaneous stranding overlying the right parotid gland,  nonspecific. This could reflect cellulitis or possibly parotitis, although only at most minimal parotid space inflammation is evident. No abscess. 2. Small but asymmetric right-sided cervical lymph nodes, likely reactive. Electronically Signed   By: Logan Bores M.D.   On: 03/14/2018  21:27    Procedures Procedures (including critical care time)  Medications Ordered in ED Medications  morphine 4 MG/ML injection 4 mg (4 mg Intravenous Refused 03/14/18 2017)  clindamycin (CLEOCIN) IVPB 600 mg (600 mg Intravenous New Bag/Given 03/14/18 2254)  sodium chloride 0.9 % bolus 1,000 mL (0 mLs Intravenous Stopped 03/14/18 2043)  acetaminophen (TYLENOL) tablet 1,000 mg (1,000 mg Oral Given 03/14/18 2014)  LORazepam (ATIVAN) injection 1 mg (1 mg Intravenous Given 03/14/18 2015)  hydrALAZINE (APRESOLINE) injection 10 mg (10 mg Intravenous Given 03/14/18 2102)  iopamidol (ISOVUE-300) 61 % injection 80 mL (80 mLs Intravenous Contrast Given 03/14/18 2038)  dexamethasone (DECADRON) injection 10 mg (10 mg Intravenous Given 03/14/18 2219)  ibuprofen (ADVIL,MOTRIN) tablet 800 mg (800 mg Oral Given 03/14/18 2216)  sodium chloride 0.9 % bolus 1,000 mL (1,000 mLs Intravenous New Bag/Given 03/14/18 2218)  labetalol (NORMODYNE,TRANDATE) injection 10 mg (10 mg Intravenous Given 03/14/18 2221)  cloNIDine (CATAPRES) tablet 0.2 mg (0.2 mg Oral Given 03/14/18 2217)     Initial Impression / Assessment and Plan / ED Course  I have reviewed the triage vital signs and the nursing notes.  Pertinent labs & imaging results that were available during my care of the patient were reviewed by me and considered in my medical decision making (see chart for details).    MALYIA MORO is a 52 y.o. female here with chills, R ear pain, R neck swelling. She has an abrasion R ear lobe from neighbor's child several days ago. I suspect some mild inflammation vs early cellulitis. Also consider sialoadenitis, less likely early ludwig's or deep space  infection. She is tachycardic and has low grade temp so sepsis workup initiated. Will get labs, lactate, cultures, CT neck.   11:20 PM Patient's WBC is 13. CT showed possible parotitis vs cellulitis with inflammation around the parotid. No deep abscess. Of note, patient was hypertensive 220/110 initially. She is on BP meds and was given BP meds in the ED and BP improved to 187/98 with no end organ damage. She also developed fever of 100.58F in the ED that resolved with tylenol, motrin. Given decadron, clindamycin. Will dc home with ENT follow up.     Final Clinical Impressions(s) / ED Diagnoses   Final diagnoses:  None    ED Discharge Orders    None       Drenda Freeze, MD 03/14/18 2322    Drenda Freeze, MD 03/14/18 [phone]

## 2018-03-16 LAB — URINE CULTURE

## 2018-03-20 LAB — CULTURE, BLOOD (ROUTINE X 2)
CULTURE: NO GROWTH
Culture: NO GROWTH
Special Requests: ADEQUATE

## 2018-04-11 ENCOUNTER — Emergency Department (HOSPITAL_COMMUNITY)
Admission: EM | Admit: 2018-04-11 | Discharge: 2018-04-12 | Disposition: A | Discharge status: Home / Self Care | Payer: Self-pay | Attending: Emergency Medicine | Admitting: Emergency Medicine

## 2018-04-11 ENCOUNTER — Encounter (HOSPITAL_COMMUNITY): Payer: Self-pay

## 2018-04-11 DIAGNOSIS — I129 Hypertensive chronic kidney disease with stage 1 through stage 4 chronic kidney disease, or unspecified chronic kidney disease: Secondary | ICD-10-CM | POA: Insufficient documentation

## 2018-04-11 DIAGNOSIS — R072 Precordial pain: Secondary | ICD-10-CM | POA: Insufficient documentation

## 2018-04-11 DIAGNOSIS — F17210 Nicotine dependence, cigarettes, uncomplicated: Secondary | ICD-10-CM | POA: Insufficient documentation

## 2018-04-11 DIAGNOSIS — N189 Chronic kidney disease, unspecified: Secondary | ICD-10-CM | POA: Insufficient documentation

## 2018-04-11 NOTE — ED Triage Notes (Signed)
Pt comes via [facility] for CP that has been going on for three days, worse for the past two hours, productive cough for the past four days, yellow sputum, SOB, worse with movement. PTA received 324 ASA

## 2018-04-12 ENCOUNTER — Emergency Department (HOSPITAL_COMMUNITY): Payer: Self-pay

## 2018-04-12 LAB — CBC
HEMATOCRIT: 39.6 % (ref 36.0–46.0)
HEMOGLOBIN: 13.3 g/dL (ref 12.0–15.0)
MCH: 28.2 pg (ref 26.0–34.0)
MCHC: 33.6 g/dL (ref 30.0–36.0)
MCV: 83.9 fL (ref 78.0–100.0)
Platelets: 358 10*3/uL (ref 150–400)
RBC: 4.72 MIL/uL (ref 3.87–5.11)
RDW: 15.2 % (ref 11.5–15.5)
WBC: 13 10*3/uL — ABNORMAL HIGH (ref 4.0–10.5)

## 2018-04-12 LAB — I-STAT TROPONIN, ED
Troponin i, poc: 0.03 ng/mL (ref 0.00–0.08)
Troponin i, poc: 0.05 ng/mL (ref 0.00–0.08)

## 2018-04-12 LAB — BASIC METABOLIC PANEL
ANION GAP: 9 (ref 5–15)
BUN: 15 mg/dL (ref 6–20)
CO2: 27 mmol/L (ref 22–32)
Calcium: 9 mg/dL (ref 8.9–10.3)
Chloride: 102 mmol/L (ref 101–111)
Creatinine, Ser: 0.85 mg/dL (ref 0.44–1.00)
GFR calc Af Amer: >60 mL/min (ref >60)
GLUCOSE: 102 mg/dL — AB (ref 65–99)
POTASSIUM: 3.4 mmol/L — AB (ref 3.5–5.1)
Sodium: 138 mmol/L (ref 135–145)

## 2018-04-12 LAB — I-STAT BETA HCG BLOOD, ED (MC, WL, AP ONLY)

## 2018-04-12 MED ORDER — SUCRALFATE 1 G PO TABS
1.0000 g | ORAL_TABLET | Freq: Once | ORAL | Status: AC
  Administered 2018-04-12: 1 g via ORAL
  Filled 2018-04-12: qty 1

## 2018-04-12 MED ORDER — SUCRALFATE 1 GM/10ML PO SUSP: 1.0000 g | Freq: Three times a day (TID) | ORAL | 0 refills | Status: AC

## 2018-04-12 NOTE — ED Notes (Addendum)
Patient inquiring how much longer before she gets a room, pt advised she will be one of the next few patients to go back and that some rooms are getting ready to be discharged, pt originally stated she would like her IV out and wanted to leave, this RN apologized for the wait and explained that staff is working to get patients back as soon as we can. Patient agreed to wait a few more minutes for a room. Pt stated she was going to step outside for a few minutes while she waits, ambulatory with nad.

## 2018-04-12 NOTE — Discharge Instructions (Signed)

## 2018-04-12 NOTE — ED Provider Notes (Signed)
Emergency Department Provider Note   I have reviewed the triage vital signs and the nursing notes.   HISTORY  Chief Complaint Chest Pain   HPI Tracie Estrada is a 52 y.o. female with PMH of acute renal failure, HTN, and non-obstructive CAD on left heart cath from 08/10/2017 presents to the emergency department for evaluation of intermittent chest pain over the past 3 days.  She describes it as a soreness/tightness sensation with no modifying factors.  The pain became more intense last night which prompted EMS call.  Patient denies any radiation of symptoms.  No diaphoresis or nausea.  No shortness of breath.  She does have some soreness to palpation of her chest but states this feels somewhat different than the pain she is experiencing.  Pain has been moderate and persistent since early yesterday evening.    Past Medical History:  Diagnosis Date  . Acute renal failure ([place]) 10/13/2013   On Vancomycin  . Breast abscess of female    Status post biopsy  . Hypertension   . Malignant hypertension 10/13/2013    Patient Active Problem List   Diagnosis Date Noted  . Hematuria   . Abnormal stress test   . Elevated troponin   . Tobacco abuse   . Hypertensive urgency 08/06/2017  . Chest pain 08/06/2017  . AKI (acute kidney injury) ([place]) 08/06/2017  . Hypertension   . Cough 11/20/2016  . Accelerated hypertension 10/25/2013  . Chronic kidney disease 10/25/2013  . Breast abscess of female 10/25/2013  . Malignant hypertension 10/13/2013  . Acute renal failure ([place]) 10/13/2013  . Left breast abscess 10/10/2013  . Mastitis 10/10/2013  . Discharge of breast 09/27/2013  . Breast mass, left 09/27/2013  . Hyperlipidemia 08/06/2010  . ANXIETY 08/06/2010  . SMOKER 08/06/2010  . Essential hypertension, benign 08/06/2010    Past Surgical History:  Procedure Laterality Date  . Biopsy of left breast    . INCISION AND DRAINAGE ABSCESS Left 10/11/2013   Procedure: INCISION AND DRAINAGE  and debridement LEFT BREAST ABSCESS;  Surgeon: Odis Hollingshead, MD;  Location: [facility];  Service: General;  Laterality: Left;  . LEFT HEART CATH AND CORONARY ANGIOGRAPHY N/A 08/10/2017   Procedure: LEFT HEART CATH AND CORONARY ANGIOGRAPHY;  Surgeon: Jettie Booze, MD;  Location: [facility];  Service: Cardiovascular;  Laterality: N/A;    Current Outpatient Rx  . Order #: [id number] Class: Normal  . Order #: [id number] Class: Normal  . Order #: [id number] Class: Print  . Order #: [id number] Class: Normal  . Order #: [id number] Class: Print  . Order #: [id number] Class: Print  . Order #: [id number] Class: Print  . Order #: [id number] Class: Normal  . Order #: [id number] Class: Print  . Order #: [id number] Class: Normal  . Order #: [id number] Class: Print  . Order #: [id number] Class: Print  . Order #: [id number] Class: Historical Med    Allergies Fish allergy; Peanuts [peanut oil]; Other; Penicillins; and Vancomycin  Family History  Problem Relation Age of Onset  . Breast cancer Mother   . Cancer Mother        brain  . Hypertension Father   . Thyroid disease Daughter     Social History Social History   Tobacco Use  . Smoking status: Current Every Day Smoker    Packs/day: 1.00    Years: 26.00    Pack years: 26.00    Types: Cigarettes  . Smokeless tobacco: Never Used  Substance Use Topics  . Alcohol use: No  . Drug use: No  Review of Systems  Constitutional: No fever/chills Eyes: No visual changes. ENT: No sore throat. Cardiovascular: Positive chest pain. Respiratory: Denies shortness of breath. Gastrointestinal: No abdominal pain.  No nausea, no vomiting.  No diarrhea.  No constipation. Genitourinary: Negative for dysuria. Musculoskeletal: Negative for back pain. Skin: Negative for rash. Neurological: Negative for headaches, focal weakness or numbness.  10-point ROS otherwise negative.  ____________________________________________   PHYSICAL EXAM:  VITAL  SIGNS: ED Triage Vitals  Enc Vitals Group     BP 04/11/18 2358 (!) 230/129     Pulse Rate 04/11/18 2358 (!) 105     Resp 04/11/18 2358 20     Temp 04/11/18 2358 98 F (36.7 C)     Temp Source 04/11/18 2358 Oral     SpO2 04/11/18 2353 100 %     Pain Score 04/11/18 2356 9   Constitutional: Alert and oriented. Well appearing and in no acute distress. Eyes: Conjunctivae are normal.  Head: Atraumatic. Nose: No congestion/rhinnorhea. Mouth/Throat: Mucous membranes are moist.  Neck: No stridor.  Cardiovascular: Tachycardia. Good peripheral circulation. Grossly normal heart sounds.   Respiratory: Normal respiratory effort.  No retractions. Lungs CTAB. Gastrointestinal: Soft and nontender. No distention.  Musculoskeletal: No lower extremity tenderness nor edema. No gross deformities of extremities. Some tenderness to palpation of the anterior chest wall causing the patient to withdrawal during exam.  Neurologic:  Normal speech and language. No gross focal neurologic deficits are appreciated.  Skin:  Skin is warm, dry and intact. No rash noted.  ____________________________________________   LABS (all labs ordered are listed, but only abnormal results are displayed)  Labs Reviewed  BASIC METABOLIC PANEL - Abnormal; Notable for the following components:      Result Value   Potassium 3.4 (*)    Glucose, Bld 102 (*)    All other components within normal limits  CBC - Abnormal; Notable for the following components:   WBC 13.0 (*)    All other components within normal limits  I-STAT TROPONIN, ED  I-STAT BETA HCG BLOOD, ED ([facility], [facility], [facility] ONLY)  I-STAT TROPONIN, ED   ____________________________________________  EKG   EKG Interpretation  Date/Time:  Sunday Apr 11 2018 23:59:38 EDT Ventricular Rate:  106 PR Interval:  138 QRS Duration: 102 QT Interval:  360 QTC Calculation: 478 R Axis:   0 Text Interpretation:  Sinus tachycardia Possible Left atrial enlargement Left ventricular  hypertrophy No STEMI. Similar to 03/14/18 tracing.  Confirmed by Nanda Quinton [phone]) on 04/12/2018 7:25:26 AM       ____________________________________________  RADIOLOGY  Dg Chest 2 View  Result Date: 04/12/2018 CLINICAL DATA:  Chest pain for 3 days. EXAM: CHEST - 2 VIEW COMPARISON:  Radiographs 03/14/2018 FINDINGS: The cardiomediastinal contours are stable with mild cardiomegaly, unchanged. The lungs are clear. Pulmonary vasculature is normal. No consolidation, pleural effusion, or pneumothorax. No acute osseous abnormalities are seen. Stable chronic change of both shoulders. IMPRESSION: Stable cardiomegaly without acute abnormality. Electronically Signed   By: Jeb Levering M.D.   On: 04/12/2018 00:40    ____________________________________________   PROCEDURES  Procedure(s) performed:   Procedures  None ____________________________________________   INITIAL IMPRESSION / ASSESSMENT AND PLAN / ED COURSE  Pertinent labs & imaging results that were available during my care of the patient were reviewed by me and considered in my medical decision making (see chart for details).  Patient presents to the emergency department for evaluation of chest pain.  She had a recent heart catheterization in 2018 showing  nonobstructive coronary artery disease.  Her pain has been intermittent over 3 days but more constant over the past now 12 hours.  I reviewed the patient's labs and EKG findings with no acute changes.  Chest x-ray reviewed with no acute findings.  I advised the patient stay for repeat troponin.  If negative would consider discharge with close cardiology follow-up.  Differential includes all life-threatening causes for chest pain. This includes but is not exclusive to acute coronary syndrome, aortic dissection, pulmonary embolism, cardiac tamponade, community-acquired pneumonia, pericarditis, musculoskeletal chest wall pain, etc.  Very low suspicion for PE with tachycardia  improving while in the ED without intervention and no hypoxemia.   08:40 AM Repeat troponin is negative.  Patient having some chest discomfort after eating here in the emergency department.  I will prescribe Carafate and have her follow-up with cardiology.  Discussed strict return precautions in detail.   At this time, I do not feel there is any life-threatening condition present. I have reviewed and discussed all results (EKG, imaging, lab, urine as appropriate), exam findings with patient. I have reviewed nursing notes and appropriate previous records.  I feel the patient is safe to be discharged home without further emergent workup. Discussed usual and customary return precautions. Patient and family (if present) verbalize understanding and are comfortable with this plan.  Patient will follow-up with their primary care provider. If they do not have a primary care provider, information for follow-up has been provided to them. All questions have been answered.  ____________________________________________  FINAL CLINICAL IMPRESSION(S) / ED DIAGNOSES  Final diagnoses:  Precordial chest pain     NEW OUTPATIENT MEDICATIONS STARTED DURING THIS VISIT:  New Prescriptions   SUCRALFATE (CARAFATE) 1 GM/10ML SUSPENSION    Take 10 mLs (1 g total) by mouth 4 (four) times daily -  with meals and at bedtime.    Note:  This document was prepared using Dragon voice recognition software and may include unintentional dictation errors.  Nanda Quinton, MD Emergency Medicine    Kena Limon, [facility], MD 04/12/18 [phone]

## 2018-04-12 NOTE — ED Notes (Addendum)
Pt refused to take off pants. Pt stated why do she need to take off her pants when all they going to look at is her heart. I informed pt that your heart is connected to everything and that looking at your leg and feet to see if you having swelling. Pt stated no she not going to do it. Pt also stated that he does not have any swelling.

## 2018-04-12 NOTE — ED Notes (Signed)
Pt angry stating we are not doing anything but "chit chatting and eating running around back and forth". Pt states she doesn't want anyone else to come get her vitals but "to come get and take me back". Attempted to talk pt down pt got very upset and became extremely disrespectful to staff.

## 2018-04-12 NOTE — ED Notes (Signed)
Pt came to nurse 1st and ask how much longer the wait would be, she said "she didn't think when you come by EMS or with chest pain you're supposed to sit in the lobby". I explained to her it's based on several things and gave her the longest wait time. She walked away upset and said "all they are doing is eating and talking"

## 2018-04-12 NOTE — ED Notes (Signed)
Pt upset about being sent to waiting area after calling EMS, states "I am going to call the news, yall are lying I know there are rooms in the back and you just have everyone laying around out here"

## 2018-05-05 ENCOUNTER — Encounter (HOSPITAL_COMMUNITY): Payer: Self-pay

## 2018-05-05 ENCOUNTER — Other Ambulatory Visit: Payer: Self-pay

## 2018-05-05 ENCOUNTER — Emergency Department (HOSPITAL_COMMUNITY): Payer: Self-pay

## 2018-05-05 ENCOUNTER — Emergency Department (HOSPITAL_COMMUNITY)
Admission: EM | Admit: 2018-05-05 | Discharge: 2018-05-05 | Disposition: A | Discharge status: Home / Self Care | Payer: Self-pay | Attending: Emergency Medicine | Admitting: Emergency Medicine

## 2018-05-05 DIAGNOSIS — I120 Hypertensive chronic kidney disease with stage 5 chronic kidney disease or end stage renal disease: Secondary | ICD-10-CM | POA: Insufficient documentation

## 2018-05-05 DIAGNOSIS — N186 End stage renal disease: Secondary | ICD-10-CM | POA: Insufficient documentation

## 2018-05-05 DIAGNOSIS — J449 Chronic obstructive pulmonary disease, unspecified: Secondary | ICD-10-CM | POA: Insufficient documentation

## 2018-05-05 DIAGNOSIS — Z79899 Other long term (current) drug therapy: Secondary | ICD-10-CM | POA: Insufficient documentation

## 2018-05-05 DIAGNOSIS — Z7982 Long term (current) use of aspirin: Secondary | ICD-10-CM | POA: Insufficient documentation

## 2018-05-05 DIAGNOSIS — R0789 Other chest pain: Secondary | ICD-10-CM | POA: Insufficient documentation

## 2018-05-05 DIAGNOSIS — Z794 Long term (current) use of insulin: Secondary | ICD-10-CM | POA: Insufficient documentation

## 2018-05-05 DIAGNOSIS — E119 Type 2 diabetes mellitus without complications: Secondary | ICD-10-CM | POA: Insufficient documentation

## 2018-05-05 DIAGNOSIS — R0600 Dyspnea, unspecified: Secondary | ICD-10-CM

## 2018-05-05 DIAGNOSIS — I160 Hypertensive urgency: Secondary | ICD-10-CM

## 2018-05-05 LAB — CBC
HEMATOCRIT: 36.6 % (ref 36.0–46.0)
HEMOGLOBIN: 12.2 g/dL (ref 12.0–15.0)
MCH: 27.5 pg (ref 26.0–34.0)
MCHC: 33.3 g/dL (ref 30.0–36.0)
MCV: 82.6 fL (ref 78.0–100.0)
Platelets: 309 10*3/uL (ref 150–400)
RBC: 4.43 MIL/uL (ref 3.87–5.11)
RDW: 15 % (ref 11.5–15.5)
WBC: 12 10*3/uL — ABNORMAL HIGH (ref 4.0–10.5)

## 2018-05-05 LAB — BASIC METABOLIC PANEL
Anion gap: 10 (ref 5–15)
BUN: 10 mg/dL (ref 6–20)
CHLORIDE: 107 mmol/L (ref 101–111)
CO2: 23 mmol/L (ref 22–32)
CREATININE: 0.73 mg/dL (ref 0.44–1.00)
Calcium: 8.3 mg/dL — ABNORMAL LOW (ref 8.9–10.3)
GFR calc non Af Amer: >60 mL/min (ref >60)
Glucose, Bld: 118 mg/dL — ABNORMAL HIGH (ref 65–99)
POTASSIUM: 2.9 mmol/L — AB (ref 3.5–5.1)
SODIUM: 140 mmol/L (ref 135–145)

## 2018-05-05 LAB — I-STAT TROPONIN, ED: TROPONIN I, POC: 0.03 ng/mL (ref 0.00–0.08)

## 2018-05-05 LAB — BRAIN NATRIURETIC PEPTIDE: B NATRIURETIC PEPTIDE 5: 239.7 pg/mL — AB (ref 0.0–100.0)

## 2018-05-05 MED ORDER — FUROSEMIDE 20 MG PO TABS
40.0000 mg | ORAL_TABLET | Freq: Once | ORAL | Status: AC
  Administered 2018-05-05: 40 mg via ORAL
  Filled 2018-05-05: qty 2

## 2018-05-05 MED ORDER — POTASSIUM CHLORIDE CRYS ER 20 MEQ PO TBCR: 20.0000 meq | EXTENDED_RELEASE_TABLET | Freq: Two times a day (BID) | ORAL | 0 refills | Status: AC

## 2018-05-05 MED ORDER — ACETAMINOPHEN 500 MG PO TABS
1000.0000 mg | ORAL_TABLET | Freq: Once | ORAL | Status: AC
  Administered 2018-05-05: 1000 mg via ORAL
  Filled 2018-05-05: qty 2

## 2018-05-05 MED ORDER — FUROSEMIDE 40 MG PO TABS: 40.0000 mg | ORAL_TABLET | Freq: Every day | ORAL | 0 refills | Status: AC

## 2018-05-05 NOTE — ED Provider Notes (Signed)
[facility] EMERGENCY DEPARTMENT Provider Note   CSN: [id number] Arrival date & time: 05/05/18  0151     History   Chief Complaint Chief Complaint  Patient presents with  . Shortness of Breath  . Hypertension    HPI Tracie Estrada is a 52 y.o. female.  Patient is a 52 year old female with past medical history of poorly controlled hypertension, chronic renal insufficiency, tobacco use.  She presents today for evaluation of shortness of breath.  This started earlier this evening.  She denies chest pain, fever, or productive cough.  She was given an albuterol treatment by EMS and her symptoms have significantly improved.  She denies any leg swelling or pain.  The history is provided by the patient.  Shortness of Breath  This is a new problem. The average episode lasts 6 hours. The problem occurs continuously.The problem has been gradually improving. Pertinent negatives include no fever, no cough, no sputum production, no chest pain, no leg pain and no leg swelling. She has tried nothing for the symptoms.  Hypertension  Associated symptoms include shortness of breath. Pertinent negatives include no chest pain.    Past Medical History:  Diagnosis Date  . Acute renal failure ([place]) 10/13/2013   On Vancomycin  . Breast abscess of female    Status post biopsy  . Hypertension   . Malignant hypertension 10/13/2013    Patient Active Problem List   Diagnosis Date Noted  . Hematuria   . Abnormal stress test   . Elevated troponin   . Tobacco abuse   . Hypertensive urgency 08/06/2017  . Chest pain 08/06/2017  . AKI (acute kidney injury) ([place]) 08/06/2017  . Hypertension   . Cough 11/20/2016  . Accelerated hypertension 10/25/2013  . Chronic kidney disease 10/25/2013  . Breast abscess of female 10/25/2013  . Malignant hypertension 10/13/2013  . Acute renal failure ([place]) 10/13/2013  . Left breast abscess 10/10/2013  . Mastitis 10/10/2013  . Discharge of breast  09/27/2013  . Breast mass, left 09/27/2013  . Hyperlipidemia 08/06/2010  . ANXIETY 08/06/2010  . SMOKER 08/06/2010  . Essential hypertension, benign 08/06/2010    Past Surgical History:  Procedure Laterality Date  . Biopsy of left breast    . INCISION AND DRAINAGE ABSCESS Left 10/11/2013   Procedure: INCISION AND DRAINAGE and debridement LEFT BREAST ABSCESS;  Surgeon: Odis Hollingshead, MD;  Location: [facility];  Service: General;  Laterality: Left;  . LEFT HEART CATH AND CORONARY ANGIOGRAPHY N/A 08/10/2017   Procedure: LEFT HEART CATH AND CORONARY ANGIOGRAPHY;  Surgeon: Jettie Booze, MD;  Location: [facility];  Service: Cardiovascular;  Laterality: N/A;     OB History    Gravida  1   Para  1   Term  1   Preterm      AB      Living  1     SAB      TAB      Ectopic      Multiple      Live Births               Home Medications    Prior to Admission medications   Medication Sig Start Date End Date Taking? Authorizing Provider  amLODipine (NORVASC) 10 MG tablet TAKE 1 TABLET (10 MG TOTAL) BY MOUTH DAILY. 07/29/17   Tresa Garter, MD  aspirin EC 81 MG tablet Take 1 tablet (81 mg total) by mouth daily. 11/20/16   Jegede,  Olugbemiga E, MD  atorvastatin (LIPITOR) 40 MG tablet Take 1 tablet (40 mg total) by mouth daily at 6 PM. 08/11/17   Jani Gravel, MD  chlorthalidone (HYGROTON) 25 MG tablet Take 2 tablets (50 mg total) by mouth daily. 08/24/17   Imogene Burn, PA-C  clindamycin (CLEOCIN) 300 MG capsule Take 1 capsule (300 mg total) by mouth 4 (four) times daily. X 7 days 03/14/18   Drenda Freeze, MD  cloNIDine (CATAPRES) 0.1 MG tablet Take 1 tablet (0.1 mg total) by mouth 3 (three) times daily as needed (sbp >160). 08/11/17   Jani Gravel, MD  hydrALAZINE (APRESOLINE) 100 MG tablet Take 1 tablet (100 mg total) by mouth every 8 (eight) hours. 08/11/17   Jani Gravel, MD  metoprolol tartrate (LOPRESSOR) 50 MG tablet Take 1 tablet (50 mg total) by mouth  2 (two) times daily. 07/29/17   Tresa Garter, MD  nitroGLYCERIN (NITROSTAT) 0.4 MG SL tablet Place 1 tablet (0.4 mg total) under the tongue every 5 (five) minutes x 3 doses as needed for chest pain. 08/11/17   Jani Gravel, MD  potassium chloride (K-DUR) 10 MEQ tablet Take 1 tablet (10 mEq total) by mouth daily. 07/29/17   Tresa Garter, MD  spironolactone (ALDACTONE) 25 MG tablet Take 1 tablet (25 mg total) by mouth daily. 08/11/17 08/11/18  Jani Gravel, MD  sucralfate (CARAFATE) 1 GM/10ML suspension Take 10 mLs (1 g total) by mouth 4 (four) times daily -  with meals and at bedtime. 04/12/18   Long, Wonda Olds, MD  triamcinolone ointment (KENALOG) 0.5 % APPLY 1 APPLICATION TOPICALLY 2  TWO  TIMES DAILY 08/21/17   [provider]  bisoprolol-hydrochlorothiazide (ZIAC) 5-6.25 MG tablet Take 1 tablet by mouth daily. 08/25/16 09/04/16  Orlie Dakin, MD    Family History Family History  Problem Relation Age of Onset  . Breast cancer Mother   . Cancer Mother        brain  . Hypertension Father   . Thyroid disease Daughter     Social History Social History   Tobacco Use  . Smoking status: Current Every Day Smoker    Packs/day: 1.00    Years: 26.00    Pack years: 26.00    Types: Cigarettes  . Smokeless tobacco: Never Used  Substance Use Topics  . Alcohol use: No  . Drug use: No     Allergies   Fish allergy; Peanuts [peanut oil]; Other; Penicillins; and Vancomycin   Review of Systems Review of Systems  Constitutional: Negative for fever.  Respiratory: Positive for shortness of breath. Negative for cough and sputum production.   Cardiovascular: Negative for chest pain and leg swelling.  All other systems reviewed and are negative.    Physical Exam Updated Vital Signs BP (!) 229/126 (BP Location: Left Arm)   Pulse (!) 113   Temp 98.1 F (36.7 C) (Oral)   Resp 18   Ht 4\' 11"  (1.499 m)   Wt 80.7 kg (178 lb)   LMP 11/13/2017   SpO2 97%   BMI 35.95 kg/m    Physical Exam  Constitutional: She is oriented to person, place, and time. She appears well-developed and well-nourished. No distress.  HENT:  Head: Normocephalic and atraumatic.  Neck: Normal range of motion. Neck supple.  Cardiovascular: Normal rate and regular rhythm. Exam reveals no gallop and no friction rub.  No murmur heard. Pulmonary/Chest: Effort normal. No respiratory distress. She has no wheezes. She has rales in the right lower  field and the left lower field.  There are slight rales in the bases bilaterally.  Abdominal: Soft. Bowel sounds are normal. She exhibits no distension. There is no tenderness.  Musculoskeletal: Normal range of motion.  Neurological: She is alert and oriented to person, place, and time.  Skin: Skin is warm and dry. She is not diaphoretic.  Nursing note and vitals reviewed.    ED Treatments / Results  Labs (all labs ordered are listed, but only abnormal results are displayed) Labs Reviewed  CBC - Abnormal; Notable for the following components:      Result Value   WBC 12.0 (*)    All other components within normal limits  BASIC METABOLIC PANEL  BRAIN NATRIURETIC PEPTIDE  I-STAT TROPONIN, ED    EKG EKG Interpretation  Date/Time:  Wednesday May 05 2018 02:03:44 EDT Ventricular Rate:  107 PR Interval:  150 QRS Duration: 92 QT Interval:  360 QTC Calculation: 480 R Axis:   21 Text Interpretation:  Sinus tachycardia Possible Left atrial enlargement ST & T wave abnormality, consider lateral ischemia Abnormal ECG Confirmed by Veryl Speak [id number]) on 05/05/2018 2:11:26 AM   Radiology No results found.  Procedures Procedures (including critical care time)  Medications Ordered in ED Medications - No data to display   Initial Impression / Assessment and Plan / ED Course  I have reviewed the triage vital signs and the nursing notes.  Pertinent labs & imaging results that were available during my care of the patient were reviewed by me  and considered in my medical decision making (see chart for details).  Patient presents here with complaints of difficulty breathing.  She was given albuterol with good results.  Her work-up reveals no evidence for a cardiac etiology.  She is feeling better now and vitals are stable with adequate oxygen saturations.  She had a heart cath in September 2018 showing nonobstructive lesions.  I doubt a cardiac etiology.  She is markedly hypertensive while in the emergency department, however she tells me her blood pressure is "always this high".  She is on multiple medications, however these do not seem to help.  At this point, I see no indication for admission.  Her blood pressure is now below 517 systolic and she is feeling better.  Her chest x-ray does show cardiomegaly with mild interstitial edema and mildly elevated BNP.  She will be discharged with Lasix and follow-up with her primary doctor.  Final Clinical Impressions(s) / ED Diagnoses   Final diagnoses:  None    ED Discharge Orders    None       Veryl Speak, MD 05/05/18 [phone]

## 2018-05-05 NOTE — Discharge Instructions (Addendum)
Lasix as prescribed.  Begin taking potassium 20 mEq twice daily while taking Lasix.  Continue your other medications as previously prescribed.  Follow-up with your primary doctor for a blood pressure recheck in the next few days, and return to the ER if symptoms worsen or change.

## 2018-05-05 NOTE — ED Triage Notes (Signed)
Pt BIB [facility] for eval of sudden onset SOB this evening around 10pm. Pt has no known hx of asthma, COPD. EMS reports on arrival pt was tripoding RR 25-30, initial BP 290/176. Pt received 10mg  neb albuterol, 0.5 atrovent. Pt initially c/o chest pain on EMS arrival, which resolved after initial duoneb. Pt denies chest pain on arrival. Pt w/ diminished lung sounds throughout and expiratory wheeze.

## 2018-05-07 MED FILL — cloNIDine HCL 0.1 MG TABS: 0.1 | 20 days supply | Qty: 60 | Fill #1

## 2018-05-07 MED FILL — SPIRONOLACTONE 25 MG TABLET: 25 | 30 days supply | Qty: 30 | Fill #1

## 2018-05-07 MED FILL — CHLORTHALIDONE 25 MG TAB: 25 | 30 days supply | Qty: 60 | Fill #0

## 2019-01-02 ENCOUNTER — Encounter (HOSPITAL_COMMUNITY): Payer: Self-pay

## 2019-01-02 ENCOUNTER — Other Ambulatory Visit: Payer: Self-pay

## 2019-01-02 ENCOUNTER — Emergency Department (HOSPITAL_COMMUNITY)
Admission: EM | Admit: 2019-01-02 | Discharge: 2019-01-02 | Disposition: A | Discharge status: Home / Self Care | Payer: Self-pay | Attending: Emergency Medicine | Admitting: Emergency Medicine

## 2019-01-02 ENCOUNTER — Emergency Department (HOSPITAL_COMMUNITY): Payer: Self-pay

## 2019-01-02 DIAGNOSIS — R197 Diarrhea, unspecified: Secondary | ICD-10-CM

## 2019-01-02 DIAGNOSIS — F17210 Nicotine dependence, cigarettes, uncomplicated: Secondary | ICD-10-CM | POA: Insufficient documentation

## 2019-01-02 DIAGNOSIS — N189 Chronic kidney disease, unspecified: Secondary | ICD-10-CM | POA: Insufficient documentation

## 2019-01-02 DIAGNOSIS — K529 Noninfective gastroenteritis and colitis, unspecified: Secondary | ICD-10-CM | POA: Insufficient documentation

## 2019-01-02 DIAGNOSIS — Z79899 Other long term (current) drug therapy: Secondary | ICD-10-CM | POA: Insufficient documentation

## 2019-01-02 DIAGNOSIS — I129 Hypertensive chronic kidney disease with stage 1 through stage 4 chronic kidney disease, or unspecified chronic kidney disease: Secondary | ICD-10-CM | POA: Insufficient documentation

## 2019-01-02 DIAGNOSIS — J101 Influenza due to other identified influenza virus with other respiratory manifestations: Secondary | ICD-10-CM | POA: Insufficient documentation

## 2019-01-02 DIAGNOSIS — Z91010 Allergy to peanuts: Secondary | ICD-10-CM | POA: Insufficient documentation

## 2019-01-02 DIAGNOSIS — R112 Nausea with vomiting, unspecified: Secondary | ICD-10-CM

## 2019-01-02 DIAGNOSIS — Z7982 Long term (current) use of aspirin: Secondary | ICD-10-CM | POA: Insufficient documentation

## 2019-01-02 LAB — COMPREHENSIVE METABOLIC PANEL
ALBUMIN: 4 g/dL (ref 3.5–5.0)
ALK PHOS: 55 U/L (ref 38–126)
ALT: 16 U/L (ref 0–44)
ANION GAP: 12 (ref 5–15)
AST: 28 U/L (ref 15–41)
BUN: 15 mg/dL (ref 6–20)
CO2: 27 mmol/L (ref 22–32)
Calcium: 8.3 mg/dL — ABNORMAL LOW (ref 8.9–10.3)
Chloride: 97 mmol/L — ABNORMAL LOW (ref 98–111)
Creatinine, Ser: 0.96 mg/dL (ref 0.44–1.00)
GFR calc non Af Amer: >60 mL/min (ref >60)
GLUCOSE: 109 mg/dL — AB (ref 70–99)
POTASSIUM: 3.5 mmol/L (ref 3.5–5.1)
SODIUM: 136 mmol/L (ref 135–145)
Total Bilirubin: 0.7 mg/dL (ref 0.3–1.2)
Total Protein: 7.8 g/dL (ref 6.5–8.1)

## 2019-01-02 LAB — URINALYSIS, ROUTINE W REFLEX MICROSCOPIC
BILIRUBIN URINE: NEGATIVE
Bacteria, UA: NONE SEEN
GLUCOSE, UA: NEGATIVE mg/dL
HGB URINE DIPSTICK: NEGATIVE
KETONES UR: NEGATIVE mg/dL
LEUKOCYTES UA: NEGATIVE
NITRITE: NEGATIVE
PROTEIN: 30 mg/dL — AB
Specific Gravity, Urine: 1.024 (ref 1.005–1.030)
pH: 7 (ref 5.0–8.0)

## 2019-01-02 LAB — CBC
HCT: 46.8 % — ABNORMAL HIGH (ref 36.0–46.0)
HEMOGLOBIN: 15.5 g/dL — AB (ref 12.0–15.0)
MCH: 29.1 pg (ref 26.0–34.0)
MCHC: 33.1 g/dL (ref 30.0–36.0)
MCV: 88 fL (ref 80.0–100.0)
NRBC: 0 % (ref 0.0–0.2)
Platelets: 231 10*3/uL (ref 150–400)
RBC: 5.32 MIL/uL — AB (ref 3.87–5.11)
RDW: 15.4 % (ref 11.5–15.5)
WBC: 7.6 10*3/uL (ref 4.0–10.5)

## 2019-01-02 LAB — INFLUENZA PANEL BY PCR (TYPE A & B)
INFLBPCR: NEGATIVE
Influenza A By PCR: POSITIVE — AB

## 2019-01-02 LAB — LIPASE, BLOOD: Lipase: 41 U/L (ref 11–51)

## 2019-01-02 LAB — I-STAT BETA HCG BLOOD, ED (MC, WL, AP ONLY)

## 2019-01-02 MED ORDER — HYDRALAZINE HCL 20 MG/ML IJ SOLN
10.0000 mg | Freq: Once | INTRAMUSCULAR | Status: AC
  Administered 2019-01-02: 10 mg via INTRAVENOUS
  Filled 2019-01-02: qty 1

## 2019-01-02 MED ORDER — METRONIDAZOLE 500 MG PO TABS: 500.0000 mg | ORAL_TABLET | Freq: Two times a day (BID) | ORAL | 0 refills | Status: AC

## 2019-01-02 MED ORDER — CHLORTHALIDONE 25 MG PO TABS: 50.0000 mg | ORAL_TABLET | Freq: Every day | ORAL | 0 refills | Status: AC

## 2019-01-02 MED ORDER — PROMETHAZINE HCL 12.5 MG PO TABS: 12.5000 mg | ORAL_TABLET | Freq: Three times a day (TID) | ORAL | 0 refills | Status: AC | PRN

## 2019-01-02 MED ORDER — PROMETHAZINE HCL 25 MG/ML IJ SOLN
12.5000 mg | Freq: Once | INTRAMUSCULAR | Status: AC
  Administered 2019-01-02: 12.5 mg via INTRAVENOUS
  Filled 2019-01-02: qty 1

## 2019-01-02 MED ORDER — SODIUM CHLORIDE 0.9 % IV BOLUS
1000.0000 mL | Freq: Once | INTRAVENOUS | Status: AC
  Administered 2019-01-02: 1000 mL via INTRAVENOUS

## 2019-01-02 MED ORDER — SPIRONOLACTONE 25 MG PO TABS: 25.0000 mg | ORAL_TABLET | Freq: Every day | ORAL | 0 refills | Status: AC

## 2019-01-02 MED ORDER — SODIUM CHLORIDE 0.9% FLUSH
3.0000 mL | Freq: Once | INTRAVENOUS | Status: AC
  Administered 2019-01-02: 3 mL via INTRAVENOUS

## 2019-01-02 MED ORDER — CHLORTHALIDONE 25 MG PO TABS: 50.0000 mg | ORAL_TABLET | Freq: Every day | ORAL | 0 refills | Status: DC

## 2019-01-02 MED ORDER — IOPAMIDOL (ISOVUE-300) INJECTION 61%
100.0000 mL | Freq: Once | INTRAVENOUS | Status: AC | PRN
  Administered 2019-01-02: 100 mL via INTRAVENOUS

## 2019-01-02 MED ORDER — IOPAMIDOL (ISOVUE-300) INJECTION 61%
INTRAVENOUS | Status: AC
  Filled 2019-01-02: qty 100

## 2019-01-02 MED ORDER — SPIRONOLACTONE 25 MG PO TABS: 25.0000 mg | ORAL_TABLET | Freq: Every day | ORAL | 0 refills | Status: DC

## 2019-01-02 MED ORDER — SODIUM CHLORIDE (PF) 0.9 % IJ SOLN
INTRAMUSCULAR | Status: AC
  Filled 2019-01-02: qty 50

## 2019-01-02 MED ORDER — ACETAMINOPHEN 325 MG PO TABS
650.0000 mg | ORAL_TABLET | Freq: Once | ORAL | Status: AC
  Administered 2019-01-02: 650 mg via ORAL
  Filled 2019-01-02: qty 2

## 2019-01-02 MED ORDER — ONDANSETRON HCL 4 MG/2ML IJ SOLN
4.0000 mg | Freq: Once | INTRAMUSCULAR | Status: AC
  Administered 2019-01-02: 4 mg via INTRAVENOUS
  Filled 2019-01-02: qty 2

## 2019-01-02 MED ORDER — CIPROFLOXACIN HCL 500 MG PO TABS: 500.0000 mg | ORAL_TABLET | Freq: Two times a day (BID) | ORAL | 0 refills | Status: AC

## 2019-01-02 NOTE — ED Notes (Signed)
Attempted to get patient to give urine specimen, stated "I am dizzy" I felt uncomfortable ambulating patient. Patient stated, "I am going to throw up." Patient vomited several times, PA notified. Patient urinated on self.

## 2019-01-02 NOTE — Discharge Instructions (Addendum)
You were seen in the emergency department today for nausea, vomiting, and diarrhea.  Your lab work was all fairly normal or similar to prior labs you have had done.  CT scan did show colitis.  I prescribed antibiotics.  Please do not drink alcohol while taking this medication.  This may be infectious or inflammatory in nature.  We are sending you home with a prescription for a nausea medication, please take this as prescribed.    We have prescribed you new medication(s) today. Discuss the medications prescribed today with your pharmacist as they can have adverse effects and interactions with your other medicines including over the counter and prescribed medications. Seek medical evaluation if you start to experience new or abnormal symptoms after taking one of these medicines, seek care immediately if you start to experience difficulty breathing, feeling of your throat closing, facial swelling, or rash as these could be indications of a more serious allergic reaction   We are also sending you with refills of your blood pressure medicines as her blood pressure was elevated in the ER today.  Please be sure to take your blood pressure medications as prescribed, track your blood pressure at home.  Please follow-up with primary care within a few days for reassessment of your nausea, vomiting, and diarrhea as well as for a recheck of your blood pressure.  Return to the ER for new or worsening symptoms or any other concerns.  Vitals:   01/02/19 1834 01/02/19 1841  BP: (!) 181/88 (!) 181/88  Pulse: 84 84  Resp:  14  Temp:    SpO2: 99% 99%

## 2019-01-02 NOTE — ED Triage Notes (Signed)
Pt states that her house has the flu. Pt states that at 0100, she started coughing and emesis. Pt states she hasn't eaten much and has emesis. Pt states about 4 episodes of emesis, mostly bile.

## 2019-01-02 NOTE — ED Notes (Signed)
Bed: WA01 Expected date:  Expected time:  Means of arrival:  Comments: 

## 2019-01-02 NOTE — ED Provider Notes (Signed)
[facility] DEPT Provider Note   CSN: [id number] Arrival date & time: 01/02/19  1016     History   Chief Complaint Chief Complaint  Patient presents with  . Emesis    HPI Tracie Estrada is a 53 y.o. female with a hx of tobacco abuse, HTN, hyperlipidemia, and CKD who presents to the ED with complaints of N/V/D that started last night. Patient states she has had a few days of congestion & productive cough with mucous sputum production. She states that last night she woke up in the middle of the night coughing which lead to emesis. States she has had TNTC episodes of emesis- some post tussive some unrelated to coughing. She has also had TNTC episode of diarrhea. Emesis/diarrhea are non bloody. Patient states she feels dehydrated & generally weak. She notes she has been unable to keep anything down including her BP medications. She denies fever, chills, hematemesis, melena, hematochezia, abdominal pain, dysuria, chest pain, dyspnea, numbness, or focal weakness. Her daughter & grandchild are both being treated for influenza w/ tamiflu at home. Denies recent foreign travel or abx use. She is running low on her spironolactone & chlorthalidone - requesting refills.   HPI  Past Medical History:  Diagnosis Date  . Acute renal failure ([place]) 10/13/2013   On Vancomycin  . Breast abscess of female    Status post biopsy  . Hypertension   . Malignant hypertension 10/13/2013    Patient Active Problem List   Diagnosis Date Noted  . Hematuria   . Abnormal stress test   . Elevated troponin   . Tobacco abuse   . Hypertensive urgency 08/06/2017  . Chest pain 08/06/2017  . AKI (acute kidney injury) ([place]) 08/06/2017  . Hypertension   . Cough 11/20/2016  . Accelerated hypertension 10/25/2013  . Chronic kidney disease 10/25/2013  . Breast abscess of female 10/25/2013  . Malignant hypertension 10/13/2013  . Acute renal failure ([place]) 10/13/2013  . Left breast abscess  10/10/2013  . Mastitis 10/10/2013  . Discharge of breast 09/27/2013  . Breast mass, left 09/27/2013  . Hyperlipidemia 08/06/2010  . ANXIETY 08/06/2010  . SMOKER 08/06/2010  . Essential hypertension, benign 08/06/2010    Past Surgical History:  Procedure Laterality Date  . Biopsy of left breast    . INCISION AND DRAINAGE ABSCESS Left 10/11/2013   Procedure: INCISION AND DRAINAGE and debridement LEFT BREAST ABSCESS;  Surgeon: Odis Hollingshead, MD;  Location: [facility];  Service: General;  Laterality: Left;  . LEFT HEART CATH AND CORONARY ANGIOGRAPHY N/A 08/10/2017   Procedure: LEFT HEART CATH AND CORONARY ANGIOGRAPHY;  Surgeon: Jettie Booze, MD;  Location: [facility];  Service: Cardiovascular;  Laterality: N/A;     OB History    Gravida  1   Para  1   Term  1   Preterm      AB      Living  1     SAB      TAB      Ectopic      Multiple      Live Births               Home Medications    Prior to Admission medications   Medication Sig Start Date End Date Taking? Authorizing Provider  amLODipine (NORVASC) 10 MG tablet TAKE 1 TABLET (10 MG TOTAL) BY MOUTH DAILY. 07/29/17   Tresa Garter, MD  aspirin EC 81 MG tablet Take 1  tablet (81 mg total) by mouth daily. 11/20/16   Tresa Garter, MD  atorvastatin (LIPITOR) 40 MG tablet Take 1 tablet (40 mg total) by mouth daily at 6 PM. 08/11/17   Jani Gravel, MD  chlorthalidone (HYGROTON) 25 MG tablet Take 2 tablets (50 mg total) by mouth daily. 08/24/17   Imogene Burn, PA-C  clindamycin (CLEOCIN) 300 MG capsule Take 1 capsule (300 mg total) by mouth 4 (four) times daily. X 7 days 03/14/18   Drenda Freeze, MD  cloNIDine (CATAPRES) 0.1 MG tablet Take 1 tablet (0.1 mg total) by mouth 3 (three) times daily as needed (sbp >160). 08/11/17   Jani Gravel, MD  furosemide (LASIX) 40 MG tablet Take 1 tablet (40 mg total) by mouth daily. 05/05/18   Veryl Speak, MD  hydrALAZINE (APRESOLINE) 100 MG tablet Take  1 tablet (100 mg total) by mouth every 8 (eight) hours. 08/11/17   Jani Gravel, MD  metoprolol tartrate (LOPRESSOR) 50 MG tablet Take 1 tablet (50 mg total) by mouth 2 (two) times daily. 07/29/17   Tresa Garter, MD  nitroGLYCERIN (NITROSTAT) 0.4 MG SL tablet Place 1 tablet (0.4 mg total) under the tongue every 5 (five) minutes x 3 doses as needed for chest pain. 08/11/17   Jani Gravel, MD  potassium chloride (K-DUR) 10 MEQ tablet Take 1 tablet (10 mEq total) by mouth daily. 07/29/17   Tresa Garter, MD  potassium chloride SA (K-DUR,KLOR-CON) 20 MEQ tablet Take 1 tablet (20 mEq total) by mouth 2 (two) times daily. 05/05/18   Veryl Speak, MD  spironolactone (ALDACTONE) 25 MG tablet Take 1 tablet (25 mg total) by mouth daily. 08/11/17 08/11/18  Jani Gravel, MD  sucralfate (CARAFATE) 1 GM/10ML suspension Take 10 mLs (1 g total) by mouth 4 (four) times daily -  with meals and at bedtime. 04/12/18   Long, Wonda Olds, MD  triamcinolone ointment (KENALOG) 0.5 % APPLY 1 APPLICATION TOPICALLY 2  TWO  TIMES DAILY 08/21/17   [provider]  bisoprolol-hydrochlorothiazide (ZIAC) 5-6.25 MG tablet Take 1 tablet by mouth daily. 08/25/16 09/04/16  Orlie Dakin, MD    Family History Family History  Problem Relation Age of Onset  . Breast cancer Mother   . Cancer Mother        brain  . Hypertension Father   . Thyroid disease Daughter     Social History Social History   Tobacco Use  . Smoking status: Current Every Day Smoker    Packs/day: 1.00    Years: 26.00    Pack years: 26.00    Types: Cigarettes  . Smokeless tobacco: Never Used  Substance Use Topics  . Alcohol use: No  . Drug use: No     Allergies   Fish allergy; Peanuts [peanut oil]; Other; Penicillins; and Vancomycin   Review of Systems Review of Systems  Constitutional: Negative for chills and fever.  HENT: Positive for congestion.   Respiratory: Positive for cough. Negative for shortness of breath.   Cardiovascular:  Negative for chest pain.  Gastrointestinal: Positive for diarrhea, nausea and vomiting. Negative for abdominal pain, blood in stool and constipation.  Genitourinary: Negative for dysuria.  Neurological: Negative for syncope, weakness (focal) and numbness.  All other systems reviewed and are negative.  Physical Exam Updated Vital Signs BP (!) 199/128 (BP Location: Right Arm)   Pulse 79   Temp 98.2 F (36.8 C) (Oral)   Resp 16   Ht 4\' 11"  (1.499 m)   Wt 83  kg   LMP 11/13/2017   SpO2 99%   BMI 36.96 kg/m   Physical Exam Vitals signs and nursing note reviewed.  Constitutional:      General: She is not in acute distress.    Appearance: She is well-developed.  HENT:     Head: Normocephalic and atraumatic.     Right Ear: Tympanic membrane is not perforated, erythematous, retracted or bulging.     Left Ear: Tympanic membrane is not perforated, erythematous, retracted or bulging.     Nose: Congestion present.     Comments: No sinus tenderness.     Mouth/Throat:     Mouth: Mucous membranes are dry.     Pharynx: Uvula midline. Posterior oropharyngeal erythema (mild) present. No oropharyngeal exudate.     Comments: Posterior oropharynx is symmetric appearing. Patient tolerating own secretions without difficulty. No trismus. No drooling. No hot potato voice. No swelling beneath the tongue, submandibular compartment is soft.  Eyes:     General:        Right eye: No discharge.        Left eye: No discharge.     Extraocular Movements: Extraocular movements intact.     Conjunctiva/sclera: Conjunctivae normal.     Pupils: Pupils are equal, round, and reactive to light.  Neck:     Musculoskeletal: Normal range of motion and neck supple.  Cardiovascular:     Rate and Rhythm: Normal rate and regular rhythm.     Heart sounds: No murmur.  Pulmonary:     Effort: Pulmonary effort is normal. No respiratory distress.     Breath sounds: Normal breath sounds. No wheezing or rales.  Abdominal:      General: There is no distension.     Palpations: Abdomen is soft.     Tenderness: There is no abdominal tenderness. There is no guarding or rebound.  Lymphadenopathy:     Cervical: No cervical adenopathy.  Skin:    General: Skin is warm and dry.     Findings: No rash.  Neurological:     General: No focal deficit present.     Mental Status: She is alert and oriented to person, place, and time.  Psychiatric:        Behavior: Behavior normal.    ED Treatments / Results  Labs (all labs ordered are listed, but only abnormal results are displayed) Labs Reviewed  CBC - Abnormal; Notable for the following components:      Result Value   RBC 5.32 (*)    Hemoglobin 15.5 (*)    HCT 46.8 (*)    All other components within normal limits  LIPASE, BLOOD  COMPREHENSIVE METABOLIC PANEL  URINALYSIS, ROUTINE W REFLEX MICROSCOPIC  INFLUENZA PANEL BY PCR (TYPE A & B)  I-STAT BETA HCG BLOOD, ED ([facility], [facility], [facility] ONLY)    EKG None  Radiology Dg Chest 2 View  Result Date: 01/02/2019 CLINICAL DATA:  Weakness, shortness of breath, chest pain EXAM: CHEST - 2 VIEW COMPARISON:  05/05/2018 FINDINGS: Lungs are clear.  No pleural effusion or pneumothorax. Cardiomegaly. Visualized osseous structures are within normal limits. IMPRESSION: Normal chest radiographs. Electronically Signed   By: Julian Hy M.D.   On: 01/02/2019 14:55    Procedures Procedures (including critical care time)  Medications Ordered in ED Medications  sodium chloride flush (NS) 0.9 % injection 3 mL (has no administration in time range)     Initial Impression / Assessment and Plan / ED Course  I have reviewed the triage  vital signs and the nursing notes.  Pertinent labs & imaging results that were available during my care of the patient were reviewed by me and considered in my medical decision making (see chart for details).   Patient presents to the ED with N/V/D, also has had some congestion/cough. Nontoxic appearing, in  no apparent distress, vitals WNL other than elevated BP in setting of not having her anti-hypertensive medicines- low suspicion for HTN emergency, BP seems to always run high on chart review, encouraged med compliance & PCP follow up. No sinus tenderness, sxs < 7 days, afebrile, doubt acute bacterial sinusitis. Centor score 0- doubt strep. No AOM on exam. No meningismus. Lungs clear, CXR negative for infiltrate- doubt pneumonia. Abdomen is nontender, no peritoneal signs.Remaining evaluation with labs including influenza swab. Fluids, zofran, & tylenol ordered w/ plan for re-assessment. High suspicion for viral GI illness at this time.   Work-up reviewed thus far:  CBC: no leukocytosis or anemia Preg test: negative.   Remaining labs pending at change of shift. Patient signed out to Britni Henderly PA-C at change of shift pending remaining labs & re-evaluation w/ PO challenge.    Final Clinical Impressions(s) / ED Diagnoses   Final diagnoses:  Nausea vomiting and diarrhea    ED Discharge Orders    None       Amaryllis Dyke, PA-C 01/02/19 1602    Charlesetta Shanks, MD 01/03/19 1032

## 2019-01-02 NOTE — ED Provider Notes (Signed)
Care transferred from [facility], [place].  See note for full HPI.  In summation, 53 year old female history of tobacco abuse, hypertension, hyperlipidemia, CKD to the ED with complaints of nausea vomiting diarrhea which began 24 hours PTA.  Has also had congestion, productive cough.  Emesis nonbloody, nonbilious.  States she feels "weak all over."  Diarrhea nonbloody.  Has not been able to keep down any medications including her blood pressure medications. Known influenza contacts yesterday.  Care transfer patient given Zofran, fluids and Tylenol.  Pending influenza panel.  Labs unremarkable.  Patient able to tolerate p.o. intake and DC home with Tamiflu.  Physical Exam  BP (!) 181/88 (BP Location: Right Arm)   Pulse 84   Temp 98.2 F (36.8 C) (Oral)   Resp 14   Ht 4\' 11"  (1.499 m)   Wt 83 kg   LMP 11/13/2017   SpO2 99%   BMI 36.96 kg/m   Physical Exam Vitals signs and nursing note reviewed.  Constitutional:      General: She is not in acute distress.    Appearance: She is well-developed. She is not ill-appearing, toxic-appearing or diaphoretic.  HENT:     Head: Atraumatic.     Nose: Nose normal.     Mouth/Throat:     Comments: Posterior oropharynx with mild erythema without exudate.  Tonsils without edema or exudate.  Mucous membranes moist. Eyes:     Pupils: Pupils are equal, round, and reactive to light.     Comments: No horizontal, vertical or rotational nystagmus   Neck:     Musculoskeletal: Normal range of motion.     Comments: Full active and passive ROM without pain No midline or paraspinal tenderness No nuchal rigidity or meningeal signs  Cardiovascular:     Rate and Rhythm: Normal rate.     Pulses: Normal pulses.     Heart sounds: Normal heart sounds.  Pulmonary:     Effort: No respiratory distress.     Comments: Clear to auscultation bilateral without wheeze, rhonchi or rales. Abdominal:     General: There is no distension.     Comments: Soft without rebound or  guarding.  Mild generalized abdominal tenderness.  No CVA tenderness normoactive bowel sounds.  Musculoskeletal: Normal range of motion.     Comments: Moves all extremities without difficulty.  Patient ambulatory in department that difficulty.  Skin:    General: Skin is warm and dry.  Neurological:     Mental Status: She is alert.     Comments: Mental Status:  Alert, oriented, thought content appropriate. Speech fluent without evidence of aphasia. Able to follow 2 step commands without difficulty.  Cranial Nerves:  II:  Peripheral visual fields grossly normal, pupils equal, round, reactive to light III,IV, VI: ptosis not present, extra-ocular motions intact bilaterally  V,VII: smile symmetric, facial light touch sensation equal VIII: hearing grossly normal bilaterally  IX,X: midline uvula rise  XI: bilateral shoulder shrug equal and strong XII: midline tongue extension  Motor:  5/5 in upper and lower extremities bilaterally including strong and equal grip strength and dorsiflexion/plantar flexion Sensory: Pinprick and light touch normal in all extremities.  Deep Tendon Reflexes: 2+ and symmetric  Cerebellar: normal finger-to-nose with bilateral upper extremities Gait: normal gait and balance CV: distal pulses palpable throughout       ED Course/Procedures    Labs Reviewed  COMPREHENSIVE METABOLIC PANEL - Abnormal; Notable for the following components:      Result Value   Chloride 97 (*)  Glucose, Bld 109 (*)    Calcium 8.3 (*)    All other components within normal limits  CBC - Abnormal; Notable for the following components:   RBC 5.32 (*)    Hemoglobin 15.5 (*)    HCT 46.8 (*)    All other components within normal limits  INFLUENZA PANEL BY PCR (TYPE A & B) - Abnormal; Notable for the following components:   Influenza A By PCR POSITIVE (*)    All other components within normal limits  LIPASE, BLOOD  URINALYSIS, ROUTINE W REFLEX MICROSCOPIC  I-STAT BETA HCG BLOOD, ED  ([facility], [facility], [facility] ONLY)   Procedures Dg Chest 2 View  Result Date: 01/02/2019 CLINICAL DATA:  Weakness, shortness of breath, chest pain EXAM: CHEST - 2 VIEW COMPARISON:  05/05/2018 FINDINGS: Lungs are clear.  No pleural effusion or pneumothorax. Cardiomegaly. Visualized osseous structures are within normal limits. IMPRESSION: Normal chest radiographs. Electronically Signed   By: Julian Hy M.D.   On: 01/02/2019 14:55   Ct Abdomen Pelvis W Contrast  Result Date: 01/02/2019 CLINICAL DATA:  Cough, vomiting, exposure to flu EXAM: CT ABDOMEN AND PELVIS WITH CONTRAST TECHNIQUE: Multidetector CT imaging of the abdomen and pelvis was performed using the standard protocol following bolus administration of intravenous contrast. CONTRAST:  164mL ISOVUE-300 IOPAMIDOL (ISOVUE-300) INJECTION 61% COMPARISON:  02/16/2013 FINDINGS: Lower chest: Lung bases are clear. Hepatobiliary: Liver is within normal limits. Gallbladder is unremarkable. No intrahepatic or extrahepatic ductal dilatation. Pancreas: Within normal limits. Spleen: Within normal limits. Adrenals/Urinary Tract: Adrenal glands are within normal limits. At least two nonobstructing left upper pole renal calculi measuring up to 3 mm (coronal image 62). Additional suspected vascular calcification. Right kidney is within normal limits. No ureteral or bladder calculi.  No hydronephrosis. Bladder is within normal limits. Stomach/Bowel: Stomach is within normal limits. No evidence of bowel obstruction. Normal appendix (series 2/image 52). Wall thickening involving the ascending and transverse colon (series 2/image 39), although with prominent submucosal fat deposition, without surrounding inflammatory changes. Given the clinical history, this appearance favors infectious/inflammatory colitis, although sequela of prior/chronic infection/inflammation is technically possible. Left colon is decompressed.  No pneumatosis. Vascular/Lymphatic: No evidence of abdominal aortic  aneurysm. Atherosclerotic calcifications of the abdominal aorta and branch vessels. No suspicious abdominopelvic lymphadenopathy. Reproductive: Uterus is within normal limits. Bilateral ovaries are within normal limits. Other: No abdominopelvic ascites. No free air. Musculoskeletal: Visualized osseous structures are within normal limits. IMPRESSION: Wall thickening involving the ascending and transverse colon, favoring infectious/inflammatory colitis given the clinical history. No pneumatosis, ascites, or free air. No evidence of bowel obstruction.  Normal appendix. Electronically Signed   By: Julian Hy M.D.   On: 01/02/2019 18:66   MDM  53 year old female appears otherwise well presents for evaluation of nausea, vomiting, diarrhea.  Onset 24 hours PTA.  Known influenza contacts.  Upper respiratory symptoms.  Labs reassuring care transfer from previous provider.  Pending p.o. challenge and influenza panel at care transfer.  On evaluation patient has had multiple episodes of emesis after Zofran.  Last episode bilious in nature.  Patient complaining of abdominal pain.  She has generalized abdominal tenderness to palpation.  No rebound or guarding.  Positive influenza panel.  Patient with continued hypertension in department.  Given she is not able to tolerate p.o. intake secondary to emesis, will give patient IV hydralazine due to systolic blood pressure in 200s. Patient requesting CT scan at this time.  Given generalized tenderness, patient's age as well as inability to tolerate p.o.  intake will order CT scan as well as provide additional antiemetics.  States she was dizzy with a trial of ambulation.  Nonfocal neurologic exam without neurologic deficits, likely related to dehydration in setting of possible viral illness.  Will reevaluate.  1830: Patient with improvement in blood pressure with IV hydralazine, recheck 181/88.  CT scan with possible infectious vs inflammatory colitis.  Will DC home with  antibiotics.  Patient will also be DC home with prescriptions for her blood pressure medications as she states she is almost out of these.  Repeat abdominal exam, soft, nontender without rebound or guarding.  No peritoneal signs.  Tolerating p.o. intake without difficulty.  Able to ambulate around room to the restroom without lightheaded or dizziness.  1850: Patient is nontoxic, nonseptic appearing, in no apparent distress.  Patient's pain and other symptoms adequately managed in emergency department.  Fluid bolus given.  Labs, imaging and vitals reviewed.  Patient does not meet the SIRS or Sepsis criteria.  On repeat exam patient does not have a surgical abdomin and there are no peritoneal signs.  No indication of appendicitis, bowel obstruction, bowel perforation, cholecystitis, diverticulitis, PID or ectopic pregnancy.  Afebrile, no tachycardia or tachypnea.  Patient discharged home with  treatment and given strict instructions for follow-up with their primary care physician.  I have also discussed reasons to return immediately to the ER.  Patient expresses understanding and agrees with plan.  1. Colitis 2. Influenza A   ,  A, PA-C 01/02/19 1901    Julianne Rice, MD 01/03/19 2333

## 2019-02-24 ENCOUNTER — Emergency Department (HOSPITAL_COMMUNITY): Payer: Medicaid Other

## 2019-02-24 ENCOUNTER — Inpatient Hospital Stay (HOSPITAL_COMMUNITY)
Admission: EM | Admit: 2019-02-24 | Discharge: 2019-03-25 | DRG: 064 | Disposition: E | Payer: Medicaid Other | Attending: Pulmonary Disease | Admitting: Pulmonary Disease

## 2019-02-24 ENCOUNTER — Inpatient Hospital Stay (HOSPITAL_COMMUNITY): Payer: Medicaid Other

## 2019-02-24 DIAGNOSIS — N179 Acute kidney failure, unspecified: Secondary | ICD-10-CM | POA: Diagnosis present

## 2019-02-24 DIAGNOSIS — E785 Hyperlipidemia, unspecified: Secondary | ICD-10-CM | POA: Diagnosis present

## 2019-02-24 DIAGNOSIS — F17210 Nicotine dependence, cigarettes, uncomplicated: Secondary | ICD-10-CM | POA: Diagnosis present

## 2019-02-24 DIAGNOSIS — R4182 Altered mental status, unspecified: Secondary | ICD-10-CM | POA: Diagnosis present

## 2019-02-24 DIAGNOSIS — G935 Compression of brain: Secondary | ICD-10-CM | POA: Diagnosis present

## 2019-02-24 DIAGNOSIS — R4020 Unspecified coma: Secondary | ICD-10-CM

## 2019-02-24 DIAGNOSIS — Z803 Family history of malignant neoplasm of breast: Secondary | ICD-10-CM

## 2019-02-24 DIAGNOSIS — J9601 Acute respiratory failure with hypoxia: Secondary | ICD-10-CM | POA: Diagnosis present

## 2019-02-24 DIAGNOSIS — I629 Nontraumatic intracranial hemorrhage, unspecified: Secondary | ICD-10-CM

## 2019-02-24 DIAGNOSIS — R000 Tachycardia, unspecified: Secondary | ICD-10-CM | POA: Diagnosis present

## 2019-02-24 DIAGNOSIS — I609 Nontraumatic subarachnoid hemorrhage, unspecified: Secondary | ICD-10-CM | POA: Diagnosis present

## 2019-02-24 DIAGNOSIS — G936 Cerebral edema: Secondary | ICD-10-CM | POA: Diagnosis present

## 2019-02-24 DIAGNOSIS — J811 Chronic pulmonary edema: Secondary | ICD-10-CM | POA: Diagnosis present

## 2019-02-24 DIAGNOSIS — I10 Essential (primary) hypertension: Secondary | ICD-10-CM | POA: Diagnosis present

## 2019-02-24 DIAGNOSIS — Z881 Allergy status to other antibiotic agents status: Secondary | ICD-10-CM

## 2019-02-24 DIAGNOSIS — N611 Abscess of the breast and nipple: Secondary | ICD-10-CM | POA: Diagnosis present

## 2019-02-24 DIAGNOSIS — W19XXXA Unspecified fall, initial encounter: Secondary | ICD-10-CM | POA: Diagnosis present

## 2019-02-24 DIAGNOSIS — Z526 Liver donor: Secondary | ICD-10-CM

## 2019-02-24 DIAGNOSIS — I469 Cardiac arrest, cause unspecified: Secondary | ICD-10-CM | POA: Diagnosis present

## 2019-02-24 DIAGNOSIS — G9382 Brain death: Secondary | ICD-10-CM | POA: Diagnosis not present

## 2019-02-24 DIAGNOSIS — Z7982 Long term (current) use of aspirin: Secondary | ICD-10-CM

## 2019-02-24 DIAGNOSIS — R739 Hyperglycemia, unspecified: Secondary | ICD-10-CM | POA: Diagnosis present

## 2019-02-24 DIAGNOSIS — Z888 Allergy status to other drugs, medicaments and biological substances status: Secondary | ICD-10-CM

## 2019-02-24 DIAGNOSIS — Z91013 Allergy to seafood: Secondary | ICD-10-CM

## 2019-02-24 DIAGNOSIS — Z4659 Encounter for fitting and adjustment of other gastrointestinal appliance and device: Secondary | ICD-10-CM

## 2019-02-24 DIAGNOSIS — Z880 Allergy status to penicillin: Secondary | ICD-10-CM

## 2019-02-24 DIAGNOSIS — Z79899 Other long term (current) drug therapy: Secondary | ICD-10-CM

## 2019-02-24 DIAGNOSIS — Z6841 Body Mass Index (BMI) 40.0 and over, adult: Secondary | ICD-10-CM

## 2019-02-24 DIAGNOSIS — Z91010 Allergy to peanuts: Secondary | ICD-10-CM

## 2019-02-24 DIAGNOSIS — Z66 Do not resuscitate: Secondary | ICD-10-CM | POA: Diagnosis present

## 2019-02-24 DIAGNOSIS — Z529 Donor of unspecified organ or tissue: Secondary | ICD-10-CM

## 2019-02-24 DIAGNOSIS — I4891 Unspecified atrial fibrillation: Secondary | ICD-10-CM | POA: Diagnosis present

## 2019-02-24 DIAGNOSIS — I161 Hypertensive emergency: Secondary | ICD-10-CM | POA: Diagnosis present

## 2019-02-24 DIAGNOSIS — R402432 Glasgow coma scale score 3-8, at arrival to emergency department: Secondary | ICD-10-CM | POA: Diagnosis present

## 2019-02-24 DIAGNOSIS — R52 Pain, unspecified: Secondary | ICD-10-CM

## 2019-02-24 DIAGNOSIS — E876 Hypokalemia: Secondary | ICD-10-CM | POA: Diagnosis present

## 2019-02-24 DIAGNOSIS — I619 Nontraumatic intracerebral hemorrhage, unspecified: Principal | ICD-10-CM | POA: Diagnosis present

## 2019-02-24 DIAGNOSIS — Z8249 Family history of ischemic heart disease and other diseases of the circulatory system: Secondary | ICD-10-CM

## 2019-02-24 LAB — CBC
HCT: 45.3 % (ref 36.0–46.0)
Hemoglobin: 14.4 g/dL (ref 12.0–15.0)
MCH: 29.7 pg (ref 26.0–34.0)
MCHC: 31.8 g/dL (ref 30.0–36.0)
MCV: 93.4 fL (ref 80.0–100.0)
Platelets: 252 10*3/uL (ref 150–400)
RBC: 4.85 MIL/uL (ref 3.87–5.11)
RDW: 15.5 % (ref 11.5–15.5)
WBC: 11.5 10*3/uL — ABNORMAL HIGH (ref 4.0–10.5)
nRBC: 0 % (ref 0.0–0.2)

## 2019-02-24 LAB — ETHANOL: Alcohol, Ethyl (B): <10 mg/dL (ref <10)

## 2019-02-24 LAB — POCT I-STAT 7, (LYTES, BLD GAS, ICA,H+H)
Acid-base deficit: 3 mmol/L — ABNORMAL HIGH (ref 0.0–2.0)
Bicarbonate: 24.7 mmol/L (ref 20.0–28.0)
Calcium, Ion: 1.11 mmol/L — ABNORMAL LOW (ref 1.15–1.40)
HCT: 35 % — ABNORMAL LOW (ref 36.0–46.0)
Hemoglobin: 11.9 g/dL — ABNORMAL LOW (ref 12.0–15.0)
O2 Saturation: 96 %
Patient temperature: 96.4
Potassium: 3.1 mmol/L — ABNORMAL LOW (ref 3.5–5.1)
Sodium: 137 mmol/L (ref 135–145)
TCO2: 26 mmol/L (ref 22–32)
pCO2 arterial: 52.1 mmHg — ABNORMAL HIGH (ref 32.0–48.0)
pH, Arterial: 7.278 — ABNORMAL LOW (ref 7.350–7.450)
pO2, Arterial: 86 mmHg (ref 83.0–108.0)

## 2019-02-24 LAB — GLUCOSE, CAPILLARY: Glucose-Capillary: 107 mg/dL — ABNORMAL HIGH (ref 70–99)

## 2019-02-24 LAB — COMPREHENSIVE METABOLIC PANEL
ALT: 66 U/L — ABNORMAL HIGH (ref 0–44)
AST: 81 U/L — ABNORMAL HIGH (ref 15–41)
Albumin: 3.6 g/dL (ref 3.5–5.0)
Alkaline Phosphatase: 56 U/L (ref 38–126)
Anion gap: 16 — ABNORMAL HIGH (ref 5–15)
BUN: 16 mg/dL (ref 6–20)
CO2: 25 mmol/L (ref 22–32)
Calcium: 8.6 mg/dL — ABNORMAL LOW (ref 8.9–10.3)
Chloride: 97 mmol/L — ABNORMAL LOW (ref 98–111)
Creatinine, Ser: 1.7 mg/dL — ABNORMAL HIGH (ref 0.44–1.00)
GFR calc Af Amer: 39 mL/min — ABNORMAL LOW (ref >60)
GFR calc non Af Amer: 34 mL/min — ABNORMAL LOW (ref >60)
Glucose, Bld: 247 mg/dL — ABNORMAL HIGH (ref 70–99)
Potassium: 2.9 mmol/L — ABNORMAL LOW (ref 3.5–5.1)
Sodium: 138 mmol/L (ref 135–145)
Total Bilirubin: 0.8 mg/dL (ref 0.3–1.2)
Total Protein: 6.6 g/dL (ref 6.5–8.1)

## 2019-02-24 LAB — I-STAT CREATININE, ED: Creatinine, Ser: 1.6 mg/dL — ABNORMAL HIGH (ref 0.44–1.00)

## 2019-02-24 LAB — PROTIME-INR
INR: 1.2 (ref 0.8–1.2)
Prothrombin Time: 15.5 seconds — ABNORMAL HIGH (ref 11.4–15.2)

## 2019-02-24 LAB — DIFFERENTIAL
Abs Immature Granulocytes: 0.2 10*3/uL — ABNORMAL HIGH (ref 0.00–0.07)
Basophils Absolute: 0.1 10*3/uL (ref 0.0–0.1)
Basophils Relative: 0 %
Eosinophils Absolute: 0.2 10*3/uL (ref 0.0–0.5)
Eosinophils Relative: 2 %
Immature Granulocytes: 2 %
Lymphocytes Relative: 45 %
Lymphs Abs: 5.2 10*3/uL — ABNORMAL HIGH (ref 0.7–4.0)
Monocytes Absolute: 0.6 10*3/uL (ref 0.1–1.0)
Monocytes Relative: 5 %
Neutro Abs: 5.3 10*3/uL (ref 1.7–7.7)
Neutrophils Relative %: 46 %

## 2019-02-24 LAB — I-STAT BETA HCG BLOOD, ED (MC, WL, AP ONLY): I-stat hCG, quantitative: <5 m[IU]/mL (ref <5)

## 2019-02-24 LAB — APTT: aPTT: 27 seconds (ref 24–36)

## 2019-02-24 LAB — TROPONIN I: Troponin I: 0.04 ng/mL (ref <0.03)

## 2019-02-24 MED ORDER — LEVETIRACETAM IN NACL 1000 MG/100ML IV SOLN
1000.0000 mg | Freq: Once | INTRAVENOUS | Status: AC
  Administered 2019-02-24: 1000 mg via INTRAVENOUS
  Filled 2019-02-24: qty 100

## 2019-02-24 MED ORDER — IOHEXOL 350 MG/ML SOLN
150.0000 mL | Freq: Once | INTRAVENOUS | Status: AC | PRN
  Administered 2019-02-24: 150 mL via INTRAVENOUS

## 2019-02-24 MED ORDER — SODIUM CHLORIDE 0.9 % IV BOLUS
2000.0000 mL | Freq: Once | INTRAVENOUS | Status: AC
  Administered 2019-02-24: 2000 mL via INTRAVENOUS

## 2019-02-24 MED ORDER — DEXAMETHASONE SODIUM PHOSPHATE 10 MG/ML IJ SOLN
10.0000 mg | Freq: Once | INTRAMUSCULAR | Status: AC
  Administered 2019-02-24: 10 mg via INTRAVENOUS
  Filled 2019-02-24: qty 1

## 2019-02-24 MED ORDER — SODIUM CHLORIDE 0.9 % IV BOLUS
1000.0000 mL | Freq: Once | INTRAVENOUS | Status: AC
  Administered 2019-02-24: 1000 mL via INTRAVENOUS

## 2019-02-24 MED ORDER — PANTOPRAZOLE SODIUM 40 MG IV SOLR
40.0000 mg | Freq: Every day | INTRAVENOUS | Status: DC
  Administered 2019-02-25 – 2019-02-26 (×3): 40 mg via INTRAVENOUS
  Filled 2019-02-24 (×3): qty 40

## 2019-02-24 MED ORDER — IOHEXOL 350 MG/ML SOLN
50.0000 mL | Freq: Once | INTRAVENOUS | Status: AC | PRN
  Administered 2019-02-25: 50 mL via INTRAVENOUS

## 2019-02-24 MED ORDER — LACTATED RINGERS IV SOLN
INTRAVENOUS | Status: DC
  Administered 2019-02-25 – 2019-02-26 (×3): via INTRAVENOUS
  Administered 2019-02-26: 75 mL/h via INTRAVENOUS
  Administered 2019-02-27: 06:00:00 via INTRAVENOUS

## 2019-02-24 MED ORDER — SODIUM CHLORIDE 0.9 % IV SOLN
3.0000 g | Freq: Three times a day (TID) | INTRAVENOUS | Status: DC
  Administered 2019-02-24 – 2019-02-28 (×12): 3 g via INTRAVENOUS
  Filled 2019-02-24 (×24): qty 3

## 2019-02-24 MED ORDER — SODIUM CHLORIDE 0.9 % IV SOLN
INTRAVENOUS | Status: AC | PRN
  Administered 2019-02-24: 1000 mL via INTRAVENOUS

## 2019-02-24 MED ORDER — DILTIAZEM HCL 25 MG/5ML IV SOLN
20.0000 mg | Freq: Once | INTRAVENOUS | Status: AC
  Administered 2019-02-24: 20 mg via INTRAVENOUS

## 2019-02-24 NOTE — Progress Notes (Signed)
Pharmacy Antibiotic Note  Tracie Estrada is a 53 y.o. female admitted on 02/23/2019 with cardiac arrest with ICH.  Pharmacy has been consulted for Unasyn dosing for aspiration PNA. Appears that pt may become an organ donor.  Plan: Unasyn 3gm IV q8h Will f/u plans of care     No data recorded.  Recent Labs  Lab 03/13/2019 1945 03/05/2019 1956  WBC 11.5*  --   CREATININE 1.70* 1.60*    CrCl cannot be calculated (Unknown ideal weight.).    Allergies  Allergen Reactions  . Fish Allergy Anaphylaxis  . Peanuts [Peanut Oil] Anaphylaxis  . Other Other (See Comments)    Anesthesia.  Specific agent unknown.  Reaction unknown.   Marland Kitchen Penicillins Itching    Did it involve swelling of the face/tongue/throat, SOB, or low BP? Unk Did it involve sudden or severe rash/hives, skin peeling, or any reaction on the inside of your mouth or nose? Unk Did you need to seek medical attention at a hospital or doctor's office? Unk When did it last happen? Unk If all above answers are "NO", may proceed with cephalosporin use.   . Vancomycin Other (See Comments)    Nephrotoxicity    Antimicrobials this admission: 2/4 Unasyn >>   Microbiology results: Pending  Thank you for allowing pharmacy to be a part of this patient's care.  Sherlon Handing, PharmD, BCPS Clinical pharmacist  **Pharmacist phone directory can now be found on [email or web address] ([email or web address]).  Listed under [place]. 03/08/2019 11:09 PM

## 2019-02-24 NOTE — ED Notes (Signed)
**  Tracie Estrada pts daughter please call @ [phone]

## 2019-02-24 NOTE — ED Notes (Signed)
Tracie Estrada [phone] pts sister please call with an update

## 2019-02-24 NOTE — ED Notes (Signed)
Patient transported to CT 

## 2019-02-24 NOTE — ED Triage Notes (Signed)
Pt here for an activated Code Stroke where EMS observed left sided weakness with right sided gaze.  Pt was awake and alert on EMS arrival.  When EMS got patient loaded and onto the truck pt vomited and went unresponsive.  Pt had 3 rounds of emesis then went apneic.  Pulses lost 5 min after emesis' episodes.  CPR initiated for 5 min with ROSC on arrival to the ED. 2 Epi given.  King airway in place.  18 g IV with IO in place.  Unresponsive with no movement on arrival.  ETT placed by ED MD.

## 2019-02-24 NOTE — ED Provider Notes (Signed)
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 female history of hypertension on multiple BP meds, here presenting with altered mental status, cardiac arrest.  Around 7 PM, patient came in the house with her daughter.  Daughter then heard a thump and noticed that she was on the floor in the bathroom.  She then became very incoherent and altered.  EMS was called and she vomited in the EMS truck. She noted to have L sided weakness with R sided eye deviation. She then lost pulses and chest compressions were started and patient was given 2 rounds of Epi and King airway was placed.      The history is provided by the patient.    Past Medical History:  Diagnosis Date   Acute renal failure ([place]) 10/13/2013   On Vancomycin   Breast abscess of female    Status post biopsy   Hypertension    Malignant hypertension 10/13/2013    Patient Active Problem List   Diagnosis Date Noted   Cardiac arrest ([place]) 03/22/2019   Hematuria    Abnormal stress test    Elevated troponin    Tobacco abuse    Hypertensive urgency 08/06/2017   Chest pain 08/06/2017   AKI (acute kidney injury) ([place]) 08/06/2017   Hypertension    Cough 11/20/2016   Accelerated hypertension 10/25/2013   Chronic kidney disease 10/25/2013   Breast abscess of female 10/25/2013   Malignant hypertension 10/13/2013   Acute renal failure ([place]) 10/13/2013   Left breast abscess 10/10/2013   Mastitis 10/10/2013   Discharge of breast 09/27/2013   Breast mass, left 09/27/2013   Hyperlipidemia 08/06/2010   ANXIETY 08/06/2010   SMOKER 08/06/2010   Essential hypertension, benign 08/06/2010    Past Surgical History:  Procedure Laterality Date   Biopsy of left breast     INCISION AND DRAINAGE  ABSCESS Left 10/11/2013   Procedure: INCISION AND DRAINAGE and debridement LEFT BREAST ABSCESS;  Surgeon: Odis Hollingshead, MD;  Location: [facility];  Service: General;  Laterality: Left;   LEFT HEART CATH AND CORONARY ANGIOGRAPHY N/A 08/10/2017   Procedure: LEFT HEART CATH AND CORONARY ANGIOGRAPHY;  Surgeon: Jettie Booze, MD;  Location: [facility];  Service: Cardiovascular;  Laterality: N/A;     OB History    Gravida  1   Para  1   Term  1   Preterm      AB      Living  1     SAB      TAB      Ectopic      Multiple      Live Births               Home Medications    Prior to Admission medications   Medication Sig Start Date End Date Taking? Authorizing Provider  amLODipine (NORVASC) 10 MG tablet TAKE 1 TABLET (10 MG TOTAL) BY MOUTH DAILY. Patient taking differently: Take 10 mg by mouth daily.  07/29/17   Tresa Garter, MD  aspirin EC 81 MG tablet Take 1 tablet (81 mg total) by mouth daily. 11/20/16   Tresa Garter, MD  atorvastatin (LIPITOR) 40 MG tablet Take 1 tablet (40 mg total) by mouth daily  at 6 PM. 08/11/17   Jani Gravel, MD  chlorthalidone (HYGROTON) 25 MG tablet Take 2 tablets (50 mg total) by mouth daily. 01/02/19   Henderly, Britni A, PA-C  ciprofloxacin (CIPRO) 500 MG tablet Take 1 tablet (500 mg total) by mouth every 12 (twelve) hours. 01/02/19   Henderly, Britni A, PA-C  clindamycin (CLEOCIN) 300 MG capsule Take 1 capsule (300 mg total) by mouth 4 (four) times daily. X 7 days 03/14/18   Drenda Freeze, MD  cloNIDine (CATAPRES) 0.1 MG tablet Take 1 tablet (0.1 mg total) by mouth 3 (three) times daily as needed (sbp >160). 08/11/17   Jani Gravel, MD  furosemide (LASIX) 40 MG tablet Take 1 tablet (40 mg total) by mouth daily. 05/05/18   Veryl Speak, MD  hydrALAZINE (APRESOLINE) 100 MG tablet Take 1 tablet (100 mg total) by mouth every 8 (eight) hours. 08/11/17   Jani Gravel, MD  hydrochlorothiazide (HYDRODIURIL) 25 MG tablet Take 25 mg  by mouth daily.    [provider]  metoprolol tartrate (LOPRESSOR) 50 MG tablet Take 1 tablet (50 mg total) by mouth 2 (two) times daily. 07/29/17   Tresa Garter, MD  nitroGLYCERIN (NITROSTAT) 0.4 MG SL tablet Place 1 tablet (0.4 mg total) under the tongue every 5 (five) minutes x 3 doses as needed for chest pain. 08/11/17   Jani Gravel, MD  potassium chloride (K-DUR) 10 MEQ tablet Take 1 tablet (10 mEq total) by mouth daily. 07/29/17   Tresa Garter, MD  potassium chloride SA (K-DUR,KLOR-CON) 20 MEQ tablet Take 1 tablet (20 mEq total) by mouth 2 (two) times daily. 05/05/18   Veryl Speak, MD  promethazine (PHENERGAN) 12.5 MG tablet Take 1 tablet (12.5 mg total) by mouth every 8 (eight) hours as needed for nausea or vomiting. 01/02/19   Henderly, Britni A, PA-C  spironolactone (ALDACTONE) 25 MG tablet Take 1 tablet (25 mg total) by mouth daily for 30 days. 01/02/19 02/28/2019  Henderly, Britni A, PA-C  sucralfate (CARAFATE) 1 GM/10ML suspension Take 10 mLs (1 g total) by mouth 4 (four) times daily -  with meals and at bedtime. 04/12/18   Long, Wonda Olds, MD  triamcinolone ointment (KENALOG) 0.5 % Apply 1 application topically 2 (two) times daily.  08/21/17   [provider]  bisoprolol-hydrochlorothiazide (ZIAC) 5-6.25 MG tablet Take 1 tablet by mouth daily. 08/25/16 09/04/16  Orlie Dakin, MD    Family History Family History  Problem Relation Age of Onset   Breast cancer Mother    Cancer Mother        brain   Hypertension Father    Thyroid disease Daughter     Social History Social History   Tobacco Use   Smoking status: Current Every Day Smoker    Packs/day: 1.00    Years: 26.00    Pack years: 26.00    Types: Cigarettes   Smokeless tobacco: Never Used  Substance Use Topics   Alcohol use: No   Drug use: No     Allergies   Fish allergy; Peanuts [peanut oil]; Other; Penicillins; and Vancomycin   Review of Systems Review of Systems  Unable to  perform ROS: Intubated  Neurological: Positive for speech difficulty.  All other systems reviewed and are negative.    Physical Exam Updated Vital Signs BP (!) 80/51    Pulse 84    Resp 13    LMP 11/13/2017    SpO2 97%   Physical Exam Vitals signs and nursing note reviewed.  Constitutional:      Comments: Lethargic, no spontaneous movements   HENT:     Head: Normocephalic.     Nose: Nose normal.     Mouth/Throat:     Mouth: Mucous membranes are moist.  Eyes:     Extraocular Movements: Extraocular movements intact.     Pupils: Pupils are equal, round, and reactive to light.  Neck:     Musculoskeletal: Normal range of motion.  Cardiovascular:     Rate and Rhythm: Tachycardia present. Rhythm irregular.     Pulses: Normal pulses.     Comments: Thready pulse, tachycardic  Abdominal:     General: Abdomen is flat.     Palpations: Abdomen is soft.  Musculoskeletal: Normal range of motion.  Skin:    General: Skin is warm.     Capillary Refill: Capillary refill takes less than 2 seconds.  Neurological:     Comments: Lethargic, fixed and dilated pupils   Psychiatric:     Comments: Unable       ED Treatments / Results  Labs (all labs ordered are listed, but only abnormal results are displayed) Labs Reviewed  CBC - Abnormal; Notable for the following components:      Result Value   WBC 11.5 (*)    All other components within normal limits  DIFFERENTIAL - Abnormal; Notable for the following components:   Lymphs Abs 5.2 (*)    Abs Immature Granulocytes 0.20 (*)    All other components within normal limits  COMPREHENSIVE METABOLIC PANEL - Abnormal; Notable for the following components:   Potassium 2.9 (*)    Chloride 97 (*)    Glucose, Bld 247 (*)    Creatinine, Ser 1.70 (*)    Calcium 8.6 (*)    AST 81 (*)    ALT 66 (*)    GFR calc non Af Amer 34 (*)    GFR calc Af Amer 39 (*)    Anion gap 16 (*)    All other components within normal limits  TROPONIN I - Abnormal;  Notable for the following components:   Troponin I 0.04 (*)    All other components within normal limits  PROTIME-INR - Abnormal; Notable for the following components:   Prothrombin Time 15.5 (*)    All other components within normal limits  I-STAT CREATININE, ED - Abnormal; Notable for the following components:   Creatinine, Ser 1.60 (*)    All other components within normal limits  POCT I-STAT 7, (LYTES, BLD GAS, ICA,H+H) - Abnormal; Notable for the following components:   pH, Arterial 7.278 (*)    pCO2 arterial 52.1 (*)    Acid-base deficit 3.0 (*)    Potassium 3.1 (*)    Calcium, Ion 1.11 (*)    HCT 35.0 (*)    Hemoglobin 11.9 (*)    All other components within normal limits  ETHANOL  APTT  RAPID URINE DRUG SCREEN, HOSP PERFORMED  URINALYSIS, ROUTINE W REFLEX MICROSCOPIC  HIV ANTIBODY (ROUTINE TESTING W REFLEX)  CBC  BASIC METABOLIC PANEL  MAGNESIUM  PHOSPHORUS  BLOOD GAS, ARTERIAL  I-STAT BETA HCG BLOOD, ED ([facility], [facility], [facility] ONLY)    EKG EKG Interpretation  Date/Time:  Thursday February 24 2019 20:30:32 EDT Ventricular Rate:  107 PR Interval:    QRS Duration: 106 QT Interval:  395 QTC Calculation: 527 R Axis:   52 Text Interpretation:  Atrial fibrillation Repol abnrm, severe global ischemia (LM/MVD) Prolonged QT interval rate improved, ST depressions improved  Confirmed  by Wandra Arthurs [phone]) on 02/26/2019 9:46:56 PM   Radiology Ct Angio Head W Or Wo Contrast  Result Date: 03/24/2019 CLINICAL DATA:  Follow up intracranial hemorrhage. History of hypertension. EXAM: CT ANGIOGRAPHY HEAD AND NECK TECHNIQUE: Multidetector CT imaging of the head and neck was performed using the standard protocol during bolus administration of intravenous contrast. Multiplanar CT image reconstructions and MIPs were obtained to evaluate the vascular anatomy. Carotid stenosis measurements (when applicable) are obtained utilizing NASCET criteria, using the distal internal carotid diameter as the  denominator. COMPARISON:  CT HEAD February 24, 2019 and CT neck March 14, 2018 FINDINGS: Examination was performed 3 times as contrast was not well demonstrated intracranially. CTA NECK FINDINGS: AORTIC ARCH: Normal appearance of the thoracic arch, normal branch pattern. Mild calcific atherosclerosis aortic arch. The origins of the innominate, left Common carotid artery and subclavian artery are patent. RIGHT CAROTID SYSTEM: Common carotid artery is patent, mild luminal irregularity compatible with atherosclerosis. Normal appearance of the carotid bifurcation without hemodynamically significant stenosis by NASCET criteria. Gradual loss of contrast opacification internal cerebral artery. LEFT CAROTID SYSTEM: Common carotid artery is patent, mild luminal irregularity compatible with atherosclerosis. Normal appearance of the carotid bifurcation without hemodynamically significant stenosis by NASCET criteria. Gradual loss of contrast internal cerebral artery. VERTEBRAL ARTERIES:RIGHT vertebral artery is dominant. Gradual loss of bilateral vertebral arteries contrast opacification at V2 segment. SKELETON: See below.  Small LEFT trapezius intramuscular lipoma. OTHER NECK: Soft tissues of the neck are nonacute though, not tailored for evaluation. UPPER CHEST: Heart is enlarged, incompletely assessed. Dependent consolidation with pulmonary vascular congestion most compatible with pulmonary edema. Partially imaged suspected RIGHT hilar lymphadenopathy. Endotracheal tube tip immediately above the carina. CTA HEAD FINDINGS: Limited assessment due to poor intracranial contrast. ANTERIOR CIRCULATION: Bilateral internal cerebral arteries are patent. Narrowed and irregular anterior middle cerebral arteries old thus appearance of the RIGHT MCA bifurcation with into cutoff. In addition, bulbous A2 3 junction measuring to 2 mm. POSTERIOR CIRCULATION: No discernible contrast within the posterior circulation. VENOUS SINUSES: Contrast  within the superior sagittal sinus and to lesser extent transverse sinus, likely narrowed due to intracranial hypertension. ANATOMIC VARIANTS: None. DELAYED PHASE: No abnormal intracranial enhancement though limited by presence of acute blood products. MIP images reviewed. CT CERVICAL SPINE: ALIGNMENT: Straightened lordosis.  No malalignment. SKULL BASE AND VERTEBRAE: Cervical vertebral bodies and posterior elements are intact. Severe C3-4 disc height loss with endplate sclerosis and marginal spurring compatible with degenerative discs. Moderate to severe C4-5 and moderate C6-7 degenerative discs. C1-2 articulation maintained. No destructive bony lesions. SOFT TISSUES AND SPINAL CANAL: See above. DISC LEVELS: Moderate canal stenosis C3-4. No high-grade neural foraminal narrowing. UPPER CHEST: Lung apices are clear. OTHER: None. IMPRESSION: CTA NECK: 1. No hemodynamically significant stenosis ICA's. 2. Patent proximal vertebral arteries. 3. Cardiomegaly and findings of pulmonary edema. CTA HEAD: 1. Poor intravascular contrast attributable to poor cardiac output and increased intracranial pressure, minimally technically improved after 3 attempts. No contrast opacification of posterior circulation. 2. Patent ICA, anterior middle cerebral arteries with attenuated appearance attributable to and intracranial hypertension, possible component of vasospasm. 3. 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 CT CERVICAL SPINE: 1. No fracture or malalignment. 2. Moderate canal stenosis C3-4. Critical Value/emergent results were called by telephone at the time of interpretation on 02/25/2019 at 8:30 pm to Dr. Leonel Ramsay, Neurology, who verbally acknowledged these results. Aortic Atherosclerosis (ICD10-I70.0). Electronically Signed   By: Elon Alas  M.D.   On: 02/25/2019 20:59   Ct Angio Neck W And/or Wo Contrast  Result Date: 03/03/2019 CLINICAL DATA:   Follow up intracranial hemorrhage. History of hypertension. EXAM: CT ANGIOGRAPHY HEAD AND NECK TECHNIQUE: Multidetector CT imaging of the head and neck was performed using the standard protocol during bolus administration of intravenous contrast. Multiplanar CT image reconstructions and MIPs were obtained to evaluate the vascular anatomy. Carotid stenosis measurements (when applicable) are obtained utilizing NASCET criteria, using the distal internal carotid diameter as the denominator. COMPARISON:  CT HEAD February 24, 2019 and CT neck March 14, 2018 FINDINGS: Examination was performed 3 times as contrast was not well demonstrated intracranially. CTA NECK FINDINGS: AORTIC ARCH: Normal appearance of the thoracic arch, normal branch pattern. Mild calcific atherosclerosis aortic arch. The origins of the innominate, left Common carotid artery and subclavian artery are patent. RIGHT CAROTID SYSTEM: Common carotid artery is patent, mild luminal irregularity compatible with atherosclerosis. Normal appearance of the carotid bifurcation without hemodynamically significant stenosis by NASCET criteria. Gradual loss of contrast opacification internal cerebral artery. LEFT CAROTID SYSTEM: Common carotid artery is patent, mild luminal irregularity compatible with atherosclerosis. Normal appearance of the carotid bifurcation without hemodynamically significant stenosis by NASCET criteria. Gradual loss of contrast internal cerebral artery. VERTEBRAL ARTERIES:RIGHT vertebral artery is dominant. Gradual loss of bilateral vertebral arteries contrast opacification at V2 segment. SKELETON: See below.  Small LEFT trapezius intramuscular lipoma. OTHER NECK: Soft tissues of the neck are nonacute though, not tailored for evaluation. UPPER CHEST: Heart is enlarged, incompletely assessed. Dependent consolidation with pulmonary vascular congestion most compatible with pulmonary edema. Partially imaged suspected RIGHT hilar lymphadenopathy.  Endotracheal tube tip immediately above the carina. CTA HEAD FINDINGS: Limited assessment due to poor intracranial contrast. ANTERIOR CIRCULATION: Bilateral internal cerebral arteries are patent. Narrowed and irregular anterior middle cerebral arteries old thus appearance of the RIGHT MCA bifurcation with into cutoff. In addition, bulbous A2 3 junction measuring to 2 mm. POSTERIOR CIRCULATION: No discernible contrast within the posterior circulation. VENOUS SINUSES: Contrast within the superior sagittal sinus and to lesser extent transverse sinus, likely narrowed due to intracranial hypertension. ANATOMIC VARIANTS: None. DELAYED PHASE: No abnormal intracranial enhancement though limited by presence of acute blood products. MIP images reviewed. CT CERVICAL SPINE: ALIGNMENT: Straightened lordosis.  No malalignment. SKULL BASE AND VERTEBRAE: Cervical vertebral bodies and posterior elements are intact. Severe C3-4 disc height loss with endplate sclerosis and marginal spurring compatible with degenerative discs. Moderate to severe C4-5 and moderate C6-7 degenerative discs. C1-2 articulation maintained. No destructive bony lesions. SOFT TISSUES AND SPINAL CANAL: See above. DISC LEVELS: Moderate canal stenosis C3-4. No high-grade neural foraminal narrowing. UPPER CHEST: Lung apices are clear. OTHER: None. IMPRESSION: CTA NECK: 1. No hemodynamically significant stenosis ICA's. 2. Patent proximal vertebral arteries. 3. Cardiomegaly and findings of pulmonary edema. CTA HEAD: 1. Poor intravascular contrast attributable to poor cardiac output and increased intracranial pressure, minimally technically improved after 3 attempts. No contrast opacification of posterior circulation. 2. Patent ICA, anterior middle cerebral arteries with attenuated appearance attributable to and intracranial hypertension, possible component of vasospasm. 3. 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 2 mm suspected A2-3  junction intact aneurysm. CT CERVICAL SPINE: 1. No fracture or malalignment. 2. Moderate canal stenosis C3-4. Critical Value/emergent results were called by telephone at the time of interpretation on 03/16/2019 at 8:30 pm to Dr. Leonel Ramsay, Neurology, who verbally acknowledged these results. Aortic Atherosclerosis (ICD10-I70.0). Electronically Signed   By: Thana Farr.D.  On: 03/15/2019 20:59   Ct C-spine No Charge  Result Date: 03/19/2019 CLINICAL DATA:  Follow up intracranial hemorrhage. History of hypertension. EXAM: CT ANGIOGRAPHY HEAD AND NECK TECHNIQUE: Multidetector CT imaging of the head and neck was performed using the standard protocol during bolus administration of intravenous contrast. Multiplanar CT image reconstructions and MIPs were obtained to evaluate the vascular anatomy. Carotid stenosis measurements (when applicable) are obtained utilizing NASCET criteria, using the distal internal carotid diameter as the denominator. COMPARISON:  CT HEAD February 24, 2019 and CT neck March 14, 2018 FINDINGS: Examination was performed 3 times as contrast was not well demonstrated intracranially. CTA NECK FINDINGS: AORTIC ARCH: Normal appearance of the thoracic arch, normal branch pattern. Mild calcific atherosclerosis aortic arch. The origins of the innominate, left Common carotid artery and subclavian artery are patent. RIGHT CAROTID SYSTEM: Common carotid artery is patent, mild luminal irregularity compatible with atherosclerosis. Normal appearance of the carotid bifurcation without hemodynamically significant stenosis by NASCET criteria. Gradual loss of contrast opacification internal cerebral artery. LEFT CAROTID SYSTEM: Common carotid artery is patent, mild luminal irregularity compatible with atherosclerosis. Normal appearance of the carotid bifurcation without hemodynamically significant stenosis by NASCET criteria. Gradual loss of contrast internal cerebral artery. VERTEBRAL ARTERIES:RIGHT  vertebral artery is dominant. Gradual loss of bilateral vertebral arteries contrast opacification at V2 segment. SKELETON: See below.  Small LEFT trapezius intramuscular lipoma. OTHER NECK: Soft tissues of the neck are nonacute though, not tailored for evaluation. UPPER CHEST: Heart is enlarged, incompletely assessed. Dependent consolidation with pulmonary vascular congestion most compatible with pulmonary edema. Partially imaged suspected RIGHT hilar lymphadenopathy. Endotracheal tube tip immediately above the carina. CTA HEAD FINDINGS: Limited assessment due to poor intracranial contrast. ANTERIOR CIRCULATION: Bilateral internal cerebral arteries are patent. Narrowed and irregular anterior middle cerebral arteries old thus appearance of the RIGHT MCA bifurcation with into cutoff. In addition, bulbous A2 3 junction measuring to 2 mm. POSTERIOR CIRCULATION: No discernible contrast within the posterior circulation. VENOUS SINUSES: Contrast within the superior sagittal sinus and to lesser extent transverse sinus, likely narrowed due to intracranial hypertension. ANATOMIC VARIANTS: None. DELAYED PHASE: No abnormal intracranial enhancement though limited by presence of acute blood products. MIP images reviewed. CT CERVICAL SPINE: ALIGNMENT: Straightened lordosis.  No malalignment. SKULL BASE AND VERTEBRAE: Cervical vertebral bodies and posterior elements are intact. Severe C3-4 disc height loss with endplate sclerosis and marginal spurring compatible with degenerative discs. Moderate to severe C4-5 and moderate C6-7 degenerative discs. C1-2 articulation maintained. No destructive bony lesions. SOFT TISSUES AND SPINAL CANAL: See above. DISC LEVELS: Moderate canal stenosis C3-4. No high-grade neural foraminal narrowing. UPPER CHEST: Lung apices are clear. OTHER: None. IMPRESSION: CTA NECK: 1. No hemodynamically significant stenosis ICA's. 2. Patent proximal vertebral arteries. 3. Cardiomegaly and findings of pulmonary  edema. CTA HEAD: 1. Poor intravascular contrast attributable to poor cardiac output and increased intracranial pressure, minimally technically improved after 3 attempts. No contrast opacification of posterior circulation. 2. Patent ICA, anterior middle cerebral arteries with attenuated appearance attributable to and intracranial hypertension, possible component of vasospasm. 3. 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 CT CERVICAL SPINE: 1. No fracture or malalignment. 2. Moderate canal stenosis C3-4. Critical Value/emergent results were called by telephone at the time of interpretation on 03/16/2019 at 8:30 pm to Dr. Leonel Ramsay, Neurology, who verbally acknowledged these results. Aortic Atherosclerosis (ICD10-I70.0). Electronically Signed   By: Elon Alas M.D.   On: 02/23/2019 20:59   Dg Chest  Port 1 View  Result Date: 02/23/2019 CLINICAL DATA:  Initial evaluation for intubation. EXAM: PORTABLE CHEST 1 VIEW COMPARISON:  Prior radiograph from 05/05/2018. FINDINGS: Endotracheal tube in place with tip positioned 1.9 cm above the carina. Moderate cardiomegaly, stable. Mediastinal silhouette within normal limits. Lungs hypoinflated. Mild diffuse pulmonary vascular congestion without overt pulmonary edema. No focal infiltrates. No pleural effusion. No pneumothorax. No acute osseous finding. IMPRESSION: 1. Tip of the endotracheal tube positioned 1.9 cm above the carina. 2. Cardiomegaly with mild diffuse pulmonary vascular congestion without overt pulmonary edema. Electronically Signed   By: Jeannine Boga M.D.   On: 03/07/2019 20:14   Ct Head Code Stroke Wo Contrast  Result Date: 03/06/2019 CLINICAL DATA:  Code stroke.  RIGHT-sided weakness, facial droop. EXAM: CT HEAD WITHOUT CONTRAST TECHNIQUE: Contiguous axial images were obtained from the base of the skull through the vertex without intravenous contrast. COMPARISON:  CT HEAD  September 30, 2010 FINDINGS: BRAIN: 4.1 x 4.5 x 5.8 cm (volume = 56 cc) ( RIGHT frontotemporal/insular hematoma with surrounding low-density vasogenic edema. 7 mm RIGHT to LEFT midline shift. Effaced RIGHT lateral ventricle without intraventricular extension. Mild global parenchymal edema with sulcal effacement. Small volume RIGHT subarachnoid hemorrhage. Basal cisterns effaced. VASCULAR: Unremarkable. SKULL/SOFT TISSUES: No skull fracture. Old bilateral nasal bone fractures. Expanded empty sella. No significant soft tissue swelling. ORBITS/SINUSES: Old RIGHT medial orbital blowout fracture. Moderate paranasal sinus mucosal thickening. Mastoid air cells are well aerated. OTHER: None. ASPECTS [facility] Stroke Program Early CT Score) -not applicable. IMPRESSION: 1. Acute 56 cc frontotemporal/insular acute hemorrhage. 7 mm RIGHT to LEFT midline shift. Global edema. 2. Small volume RIGHT subarachnoid hemorrhage. 3. Critical Value/emergent results were called by telephone at the time of interpretation on 03/04/2019 at 8:13 pm to Dr. Shirlyn Goltz , who verbally acknowledged these results. 4. Critical Value/emergent results text paged to [place], Neurology via AMION secure system on 03/07/2019 at 8:10 pm, including interpreting physician's phone number. Electronically Signed   By: Elon Alas M.D.   On: 02/23/2019 20:14    Procedures Procedures (including critical care time)  INTUBATION Performed by: Wandra Arthurs  Required items: required blood products, implants, devices, and special equipment available Patient identity confirmed: provided demographic data and hospital-assigned identification number Time out: Immediately prior to procedure a "time out" was called to verify the correct patient, procedure, equipment, support staff and site/side marked as required.  Indications: cardiac arrest  Intubation method: Glidescope Laryngoscopy   Preoxygenation: BVM  Sedatives: none  Paralytic: none  Tube  Size: 7.5 cuffed  Post-procedure assessment: chest rise and ETCO2 monitor Breath sounds: equal and absent over the epigastrium Tube secured with: ETT holder Chest x-ray interpreted by radiologist and me.  Chest x-ray findings: endotracheal tube in appropriate position  Patient tolerated the procedure well with no immediate complications.  CRITICAL CARE Performed by: Wandra Arthurs   Total critical care time: 80 minutes  Critical care time was exclusive of separately billable procedures and treating other patients.  Critical care was necessary to treat or prevent imminent or life-threatening deterioration.  Critical care was time spent personally by me on the following activities: development of treatment plan with patient and/or surrogate as well as nursing, discussions with consultants, evaluation of patient's response to treatment, examination of patient, obtaining history from patient or surrogate, ordering and performing treatments and interventions, ordering and review of laboratory studies, ordering and review of radiographic studies, pulse oximetry and re-evaluation of patient's condition.    Medications Ordered in  ED Medications  0.9 %  sodium chloride infusion (1,000 mLs Intravenous New Bag/Given 03/09/2019 1950)  pantoprazole (PROTONIX) injection 40 mg (has no administration in time range)  lactated ringers infusion (has no administration in time range)  sodium chloride 0.9 % bolus 2,000 mL (has no administration in time range)  diltiazem (CARDIZEM) injection 20 mg (20 mg Intravenous Given 03/19/2019 1949)  sodium chloride 0.9 % bolus 1,000 mL (1,000 mLs Intravenous New Bag/Given 02/26/2019 2041)  dexamethasone (DECADRON) injection 10 mg (10 mg Intravenous Given 03/10/2019 2045)  levETIRAcetam (KEPPRA) IVPB 1000 mg/100 mL premix (1,000 mg Intravenous New Bag/Given 03/22/2019 2043)  sodium chloride 0.9 % bolus 1,000 mL (1,000 mLs Intravenous New Bag/Given 02/25/2019 2043)  iohexol (OMNIPAQUE) 350  MG/ML injection 150 mL (150 mLs Intravenous Contrast Given 02/26/2019 2020)     Initial Impression / Assessment and Plan / ED Course  I have reviewed the triage vital signs and the nursing notes.  Pertinent labs & imaging results that were available during my care of the patient were reviewed by me and considered in my medical decision making (see chart for details).       EVONDA ENGE is a 53 y.o. female here with stroke symptoms, cardiac arrest. Concerned for hypertensive bleed. Patient also was in rapid afib so consider large ischemic stroke as well. Code stroke activated by EMS. Patient also had cardiac arrest and I intubated patient that is confirmed by CXR.   8 pm Patient's CT head showed large bleed with midline shift. Patient is now hypotensive to 80-90s, concerned for cushing's reflex. Talked to neurosurgery PA, Vinnie, who will come and see patient.   9 pm Neurosurgery saw patient and felt that this is likely life threatening bleed. They recommend perfusion study in AM and ICU admission. I talked to critical care.   9:30 pm I had extensive code discussion with daughter and sister. They live in [place] and wants to see patient and keep her alive until they get here. I told them that we will not code patient if we lose pulses again. They are not ready for terminal extubation currently.   10:07 PM Critical care will admit    Final Clinical Impressions(s) / ED Diagnoses   Final diagnoses:  Intracranial hemorrhage [facility])  Cardiac arrest [facility])    ED Discharge Orders    None       Drenda Freeze, MD 03/07/2019 2207

## 2019-02-24 NOTE — Procedures (Signed)
Arterial Catheter Insertion Procedure Note DEBAR PLATE [id number] 1966/10/08  Procedure: Insertion of Arterial Catheter  Indications: Blood pressure monitoring  Procedure Details Consent: Unable to obtain consent because of emergent medical necessity. Time Out: Verified patient identification, verified procedure, site/side was marked, verified correct patient position, special equipment/implants available, medications/allergies/relevent history reviewed, required imaging and test results available.  Performed  Maximum sterile technique was used including antiseptics, cap, gloves, gown, hand hygiene, mask and sheet. Skin prep: Chlorhexidine; local anesthetic administered 20 gauge catheter was inserted into left radial artery using the Seldinger technique. ULTRASOUND GUIDANCE USED: YES Evaluation Blood flow good; BP tracing good. Complications: No apparent complications.   Marlowe Aschoff 03/02/2019

## 2019-02-24 NOTE — Consult Note (Signed)
Chief Complaint   Chief Complaint  Patient presents with   Cardiac Arrest   Code Stroke    HPI   Consult requested by: Dr Darl Householder Reason for consult: ICH  HPI: Tracie Estrada is a 53 y.o. female brought via EMS as code stroke. By report, daughter heard a thump and found patient on ground just PTA. EMS noted left sided weakness with right right gaze preference. While being loaded onto truck, patient vomited and became unresponsive & pulseless. CPR initiated for 5 min, epi x2 with ROSC. King airway placed by EMS with ETT placed by ED MD.   Patient arrived with GCS 3. Did not require any sedation for intubation. Has not had any sedating medications since being here in the ED. No motor function noted since arrival.  Patient underwent stat Head CT which revealed a large frontotemporal/insular hematoma without IVH. NSY consultation requested.  Patient was hypertensive and went into Afib with RVR after head CT. At the time of my evaluation, she was hypotensive (80/60) and tachycardic (106).  Medications reviewed. Patient is not on any blood thinning agents with the exception of 81mg  ASA daily.   Patient Active Problem List   Diagnosis Date Noted   Hematuria    Abnormal stress test    Elevated troponin    Tobacco abuse    Hypertensive urgency 08/06/2017   Chest pain 08/06/2017   AKI (acute kidney injury) ([place]) 08/06/2017   Hypertension    Cough 11/20/2016   Accelerated hypertension 10/25/2013   Chronic kidney disease 10/25/2013   Breast abscess of female 10/25/2013   Malignant hypertension 10/13/2013   Acute renal failure ([place]) 10/13/2013   Left breast abscess 10/10/2013   Mastitis 10/10/2013   Discharge of breast 09/27/2013   Breast mass, left 09/27/2013   Hyperlipidemia 08/06/2010   ANXIETY 08/06/2010   SMOKER 08/06/2010   Essential hypertension, benign 08/06/2010    PMH: Past Medical History:  Diagnosis Date   Acute renal failure ([place]) 10/13/2013     On Vancomycin   Breast abscess of female    Status post biopsy   Hypertension    Malignant hypertension 10/13/2013    PSH: Past Surgical History:  Procedure Laterality Date   Biopsy of left breast     INCISION AND DRAINAGE ABSCESS Left 10/11/2013   Procedure: INCISION AND DRAINAGE and debridement LEFT BREAST ABSCESS;  Surgeon: Odis Hollingshead, MD;  Location: [facility];  Service: General;  Laterality: Left;   LEFT HEART CATH AND CORONARY ANGIOGRAPHY N/A 08/10/2017   Procedure: LEFT HEART CATH AND CORONARY ANGIOGRAPHY;  Surgeon: Jettie Booze, MD;  Location: [facility];  Service: Cardiovascular;  Laterality: N/A;    (Not in a hospital admission)   SH: Social History   Tobacco Use   Smoking status: Current Every Day Smoker    Packs/day: 1.00    Years: 26.00    Pack years: 26.00    Types: Cigarettes   Smokeless tobacco: Never Used  Substance Use Topics   Alcohol use: No   Drug use: No    MEDS: Prior to Admission medications   Medication Sig Start Date End Date Taking? Authorizing Provider  amLODipine (NORVASC) 10 MG tablet TAKE 1 TABLET (10 MG TOTAL) BY MOUTH DAILY. 07/29/17   Tresa Garter, MD  aspirin EC 81 MG tablet Take 1 tablet (81 mg total) by mouth daily. 11/20/16   Tresa Garter, MD  atorvastatin (LIPITOR) 40 MG tablet Take 1 tablet (40 mg  total) by mouth daily at 6 PM. 08/11/17   Jani Gravel, MD  chlorthalidone (HYGROTON) 25 MG tablet Take 2 tablets (50 mg total) by mouth daily. 01/02/19   Henderly, Britni A, PA-C  ciprofloxacin (CIPRO) 500 MG tablet Take 1 tablet (500 mg total) by mouth every 12 (twelve) hours. 01/02/19   Henderly, Britni A, PA-C  clindamycin (CLEOCIN) 300 MG capsule Take 1 capsule (300 mg total) by mouth 4 (four) times daily. X 7 days 03/14/18   Drenda Freeze, MD  cloNIDine (CATAPRES) 0.1 MG tablet Take 1 tablet (0.1 mg total) by mouth 3 (three) times daily as needed (sbp >160). 08/11/17   Jani Gravel, MD   furosemide (LASIX) 40 MG tablet Take 1 tablet (40 mg total) by mouth daily. 05/05/18   Veryl Speak, MD  hydrALAZINE (APRESOLINE) 100 MG tablet Take 1 tablet (100 mg total) by mouth every 8 (eight) hours. 08/11/17   Jani Gravel, MD  metoprolol tartrate (LOPRESSOR) 50 MG tablet Take 1 tablet (50 mg total) by mouth 2 (two) times daily. 07/29/17   Tresa Garter, MD  nitroGLYCERIN (NITROSTAT) 0.4 MG SL tablet Place 1 tablet (0.4 mg total) under the tongue every 5 (five) minutes x 3 doses as needed for chest pain. 08/11/17   Jani Gravel, MD  potassium chloride (K-DUR) 10 MEQ tablet Take 1 tablet (10 mEq total) by mouth daily. 07/29/17   Tresa Garter, MD  potassium chloride SA (K-DUR,KLOR-CON) 20 MEQ tablet Take 1 tablet (20 mEq total) by mouth 2 (two) times daily. 05/05/18   Veryl Speak, MD  promethazine (PHENERGAN) 12.5 MG tablet Take 1 tablet (12.5 mg total) by mouth every 8 (eight) hours as needed for nausea or vomiting. 01/02/19   Henderly, Britni A, PA-C  spironolactone (ALDACTONE) 25 MG tablet Take 1 tablet (25 mg total) by mouth daily for 30 days. 01/02/19 02/28/2019  Henderly, Britni A, PA-C  sucralfate (CARAFATE) 1 GM/10ML suspension Take 10 mLs (1 g total) by mouth 4 (four) times daily -  with meals and at bedtime. 04/12/18   Long, Wonda Olds, MD  triamcinolone ointment (KENALOG) 0.5 % Apply 1 application topically 2 (two) times daily.  08/21/17   [provider]  bisoprolol-hydrochlorothiazide (ZIAC) 5-6.25 MG tablet Take 1 tablet by mouth daily. 08/25/16 09/04/16  Orlie Dakin, MD    ALLERGY: Allergies  Allergen Reactions   Fish Allergy Anaphylaxis   Peanuts [Peanut Oil] Anaphylaxis   Other Other (See Comments)    Anesthesia.  Specific agent unknown.  Reaction unknown.    Penicillins Itching   Vancomycin Other (See Comments)    Nephrotoxicity    Social History   Tobacco Use   Smoking status: Current Every Day Smoker    Packs/day: 1.00    Years: 26.00    Pack  years: 26.00    Types: Cigarettes   Smokeless tobacco: Never Used  Substance Use Topics   Alcohol use: No     Family History  Problem Relation Age of Onset   Breast cancer Mother    Cancer Mother        brain   Hypertension Father    Thyroid disease Daughter      ROS   ROS intubated, unable to obtain  Exam   Vitals:   03/17/2019 1941 03/19/2019 1945  BP:  137/78  Pulse:  84  Resp:  (!) 27  SpO2: 95% 99%   [place] female  Intubated, no sedating meds running  GCS 3 [id number] Pupils  44mm fixed bilaterally, nonreactive Neg corneal/gag/coughn No response to central pain  Not breathing over the vent  Exam 10 minutes later without change.  Results - Imaging/Labs   Results for orders placed or performed during the hospital encounter of 03/14/2019 (from the past 48 hour(s))  Ethanol     Status: None   Collection Time: 03/21/2019  7:45 PM  Result Value Ref Range   Alcohol, Ethyl (B) <10 <10 mg/dL    Comment: (NOTE) Lowest detectable limit for serum alcohol is 10 mg/dL. For medical purposes only. Performed at [facility], [place] [address] [place], [place] [address]   CBC     Status: Abnormal   Collection Time: 03/15/2019  7:45 PM  Result Value Ref Range   WBC 11.5 (H) 4.0 - 10.5 K/uL   RBC 4.85 3.87 - 5.11 MIL/uL   Hemoglobin 14.4 12.0 - 15.0 g/dL   HCT 45.3 36.0 - 46.0 %   MCV 93.4 80.0 - 100.0 fL   MCH 29.7 26.0 - 34.0 pg   MCHC 31.8 30.0 - 36.0 g/dL   RDW 15.5 11.5 - 15.5 %   Platelets 252 150 - 400 K/uL   nRBC 0.0 0.0 - 0.2 %    Comment: Performed at [facility], [place] [address] [place], [place] [address]  Differential     Status: Abnormal   Collection Time: 02/26/2019  7:45 PM  Result Value Ref Range   Neutrophils Relative % 46 %   Neutro Abs 5.3 1.7 - 7.7 K/uL   Lymphocytes Relative 45 %   Lymphs Abs 5.2 (H) 0.7 - 4.0 K/uL   Monocytes Relative 5 %   Monocytes Absolute 0.6 0.1 - 1.0 K/uL   Eosinophils Relative 2 %   Eosinophils  Absolute 0.2 0.0 - 0.5 K/uL   Basophils Relative 0 %   Basophils Absolute 0.1 0.0 - 0.1 K/uL   Immature Granulocytes 2 %   Abs Immature Granulocytes 0.20 (H) 0.00 - 0.07 K/uL    Comment: Performed at [place] [address], [place], [place] [address]  I-Stat beta hCG blood, ED     Status: None   Collection Time: 03/17/2019  7:54 PM  Result Value Ref Range   I-stat hCG, quantitative <5.0 <5 mIU/mL   Comment 3            Comment:   GEST. AGE      CONC.  (mIU/mL)   <=1 WEEK        5 - 50     2 WEEKS       50 - 500     3 WEEKS       100 - 10,000     4 WEEKS     1,000 - 30,000        FEMALE AND NON-PREGNANT FEMALE:     LESS THAN 5 mIU/mL   I-stat Creatinine, ED     Status: Abnormal   Collection Time: 03/04/2019  7:56 PM  Result Value Ref Range   Creatinine, Ser 1.60 (H) 0.44 - 1.00 mg/dL    Dg Chest Port 1 View  Result Date: 03/08/2019 CLINICAL DATA:  Initial evaluation for intubation. EXAM: PORTABLE CHEST 1 VIEW COMPARISON:  Prior radiograph from 05/05/2018. FINDINGS: Endotracheal tube in place with tip positioned 1.9 cm above the carina. Moderate cardiomegaly, stable. Mediastinal silhouette within normal limits. Lungs hypoinflated. Mild diffuse pulmonary vascular congestion without overt pulmonary edema. No focal infiltrates. No pleural effusion. No  pneumothorax. No acute osseous finding. IMPRESSION: 1. Tip of the endotracheal tube positioned 1.9 cm above the carina. 2. Cardiomegaly with mild diffuse pulmonary vascular congestion without overt pulmonary edema. Electronically Signed   By: Jeannine Boga M.D.   On: 03/09/2019 20:14   Ct Head Code Stroke Wo Contrast  Result Date: 03/08/2019 CLINICAL DATA:  Code stroke.  RIGHT-sided weakness, facial droop. EXAM: CT HEAD WITHOUT CONTRAST TECHNIQUE: Contiguous axial images were obtained from the base of the skull through the vertex without intravenous contrast. COMPARISON:  CT HEAD September 30, 2010 FINDINGS: BRAIN: 4.1 x 4.5 x 5.8 cm  (volume = 56 cc) ( RIGHT frontotemporal/insular hematoma with surrounding low-density vasogenic edema. 7 mm RIGHT to LEFT midline shift. Effaced RIGHT lateral ventricle without intraventricular extension. Mild global parenchymal edema with sulcal effacement. Small volume RIGHT subarachnoid hemorrhage. Basal cisterns effaced. VASCULAR: Unremarkable. SKULL/SOFT TISSUES: No skull fracture. Old bilateral nasal bone fractures. Expanded empty sella. No significant soft tissue swelling. ORBITS/SINUSES: Old RIGHT medial orbital blowout fracture. Moderate paranasal sinus mucosal thickening. Mastoid air cells are well aerated. OTHER: None. ASPECTS [facility] Stroke Program Early CT Score) -not applicable. IMPRESSION: 1. Acute 56 cc frontotemporal/insular acute hemorrhage. 7 mm RIGHT to LEFT midline shift. Global edema. 2. Small volume RIGHT subarachnoid hemorrhage. 3. Critical Value/emergent results were called by telephone at the time of interpretation on 02/23/2019 at 8:13 pm to Dr. Shirlyn Goltz , who verbally acknowledged these results. 4. Critical Value/emergent results text paged to [place], Neurology via AMION secure system on 03/08/2019 at 8:10 pm, including interpreting physician's phone number. Electronically Signed   By: Elon Alas M.D.   On: 02/26/2019 20:14   Impression/Plan   53 y.o. female with large right frontotemporal insular hematoma without IVH, small volume SAH. She has no signs of brainstem function. Imaging reviewed with Dr Sherwood Gambler. Question normal brain perfusion on CTA - awaiting neuroradiology read. Unfortunately, given current exam, there is no role for NS intervention. Prognosis is grim. Rec admission under PCCM. If no change in neuro status tomorrow, rec cerebroperfusion testing.     Ferne Reus, PA-C [place] [facility] [employer]

## 2019-02-24 NOTE — ED Notes (Signed)
[facility] called: refferal # [phone] Spoke with Guerry Bruin

## 2019-02-24 NOTE — H&P (Addendum)
NAME:  Tracie Estrada, MRN:  [id number], DOB:  1966/04/04, LOS: 0 ADMISSION DATE:  02/26/2019, CONSULTATION DATE:  03/02/2019 REFERRING MD:  Dr. Darl Householder, CHIEF COMPLAINT:  AMS/ cardiac arrest  Brief History   46 yoF presenting after unwitnessed fall, AMS, right gaze, left sided weakness. Vomited then became unresponsive and pulseless with CPR, 2 rounds epi with ROSC to afib/ RVR.  Intubated in ER.  CTH large right frontotemporal insular hematoma with diffuse cerebral edema.  Neurosurgery consulted and no role for surgery with grim prognosis. Absent brain reflexes on admit.  PCCM to admit.   History of present illness   HPI obtained from medical chart review as patient is unresponsive and on mechanical ventilation.    53 year old female with history of tobacco abuse, hypertension, and HLD presenting from home after Tracie Estrada heard a thump and found patient lying on floor, possibly after falling down several stairs.  Was found by EMS with left sided weakness and right gaze.  Code stroke activated.  During transport, patient vomited then became unresponsive and pulseless.  CPR initiated with 2 rounds of EPI prior to [place] with rhythm of Afib w/RVR.  She was intubated in ER.  CT head noted for large right frontotemporal insular hematoma with diffuse cerebral edema.  She was treated with cardizem, 2L bolus, decadron, and keppra in ER.  CXR showing pulmonary edema.  Neurosurgery consulted with no role for surgery and grim prognosis. Neurology following as patient was a code stroke.  PCCM to admit.   Past Medical History  Tobacco abuse, HTN, HLD  Significant Hospital Events   4/2   Consults:  [facility]  Procedures:  4/2 ETT >>  Significant Diagnostic Tests:  4/2 CTH >> 1. Acute 56 cc frontotemporal/insular acute hemorrhage. 7 mm RIGHT to LEFT midline shift. Global edema. 2. Small volume RIGHT subarachnoid hemorrhage.  4/2 CTA  Neck: 1. No hemodynamically significant stenosis ICA's. 2. Patent  proximal vertebral arteries. 3. Cardiomegaly and findings of pulmonary edema. Head: 1. Poor intravascular contrast attributable to poor cardiac output and increased intracranial pressure, minimally technically improved after 3 attempts. No contrast opacification of posterior circulation. 2. Patent ICA, anterior middle cerebral arteries with attenuated appearance attributable to and intracranial hypertension, possible component of vasospasm. 3. 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 CERVICAL SPINE: 1. No fracture or malalignment. 2. Moderate canal stenosis C3-4. Aortic Atherosclerosis   Micro Data:   Antimicrobials:  4/2 unasyn >>  Interim history/subjective:  On 2nd Liter bolus for hypotension  Objective   Blood pressure (!) 80/51, pulse 84, resp. rate 13, last menstrual period 11/13/2017, SpO2 97 %.    Vent Mode: PRVC FiO2 (%):  [100 %] 100 % Set Rate:  [18 bmp] 18 bmp Vt Set:  [400 mL] 400 mL PEEP:  [5 cmH20] 5 cmH20 Plateau Pressure:  [20 cmH20] 20 cmH20  No intake or output data in the 24 hours ending 03/23/2019 2122 There were no vitals filed for this visit.  Examination: General:  Critically ill female unresponsive on MV HEENT: MM pink/moist, pupils fixed/ dilated, no corneal reflexes, ETT, c-collar in place Neuro: unresponsive CV: IRIR, afib, no murmur PULM: MV supported breaths, apneic on PSV, diffuse rales, thick tan/pink secretions GI: obese, soft, hypoBS  Extremities: warm/dry, no edema, left tibial I/O Skin: no rashes   Resolved Hospital Problem list    Assessment & Plan:  Large right frontotemporal insular hematoma with diffuse cerebral  edema P:  Appreciate Neurology and NSGY assistance This is not a survivable head bleed, therefore after talking with family, she is a DNR as any further care would be futile.  CT perfusion study tonight to evaluate for brain death.  Would recommend  going forward transition to comfort care if not declared brain dead.  Ongoing neuro exams  Acute hypoxic respiratory failure R/o Aspiration Likely a component of neurogenic pulmonary edema P:  Full MV support, PRVC 8 cc/kg, rate 18 Wean FiO2 for goal sat > 94% VAP bundle PPI Empiric unasyn   Cardiac arrest  Hypotension- likely related to herniation +/- cardiogenic component  Elevated troponin P:  Additional 2L now No pressors Tele monitoring  Hyperglycemia P:  SSI CBG q 4  AKI Hypokalemia P:  Fluids  Insert foley  Best practice:  Diet: NPO Pain/Anxiety/Delirium protocol (if indicated): n/a VAP protocol (if indicated): yes DVT prophylaxis: SCDs only GI prophylaxis: PPI Glucose control: SSI Mobility: BR Code Status: DNR Family Communication: Tracie Estrada, Tracie Estrada [phone], updated by Dr. Claudie Leach Disposition: ICU  Labs   CBC: Recent Labs  Lab 03/24/2019 1945 02/25/2019 2053  WBC 11.5*  --   NEUTROABS 5.3  --   HGB 14.4 11.9*  HCT 45.3 35.0*  MCV 93.4  --   PLT 252  --     Basic Metabolic Panel: Recent Labs  Lab 03/22/2019 1945 03/14/2019 1956 03/22/2019 2053  NA 138  --  137  K 2.9*  --  3.1*  CL 97*  --   --   CO2 25  --   --   GLUCOSE 247*  --   --   BUN 16  --   --   CREATININE 1.70* 1.60*  --   CALCIUM 8.6*  --   --    GFR: CrCl cannot be calculated (Unknown ideal weight.). Recent Labs  Lab 03/06/2019 1945  WBC 11.5*    Liver Function Tests: Recent Labs  Lab 03/05/2019 1945  AST 81*  ALT 66*  ALKPHOS 56  BILITOT 0.8  PROT 6.6  ALBUMIN 3.6   No results for input(s): LIPASE, AMYLASE in the last 168 hours. No results for input(s): AMMONIA in the last 168 hours.  ABG    Component Value Date/Time   PHART 7.278 (L) 02/23/2019 2053   PCO2ART 52.1 (H) 03/04/2019 2053   PO2ART 86.0 02/26/2019 2053   HCO3 24.7 03/13/2019 2053   TCO2 26 03/15/2019 2053   ACIDBASEDEF 3.0 (H) 03/10/2019 2053   O2SAT 96.0 03/02/2019 2053     Coagulation  Profile: No results for input(s): INR, PROTIME in the last 168 hours.  Cardiac Enzymes: Recent Labs  Lab 03/14/2019 1945  TROPONINI 0.04*    HbA1C: Hgb A1c MFr Bld  Date/Time Value Ref Range Status  07/07/2010 09:14 AM (H) <5.7 % Final   5.8 (NOTE)                                                                       According to the ADA Clinical Practice Recommendations for 2011, when HbA1c is used as a screening test:   >=6.5%   Diagnostic of Diabetes Mellitus           (if abnormal result  is confirmed)  5.7-6.4%   Increased risk of developing Diabetes Mellitus  References:Diagnosis and Classification of Diabetes Mellitus,Diabetes NIOE,7035,00(XFGHW 1):S62-S69 and Standards of Medical Care in         Diabetes - 2011,Diabetes EXHB,7169,67  (Suppl 1):S11-S61.    CBG: No results for input(s): GLUCAP in the last 168 hours.  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   Surgical History    Past Surgical History:  Procedure Laterality Date  . Biopsy of left breast    . INCISION AND DRAINAGE ABSCESS Left 10/11/2013   Procedure: INCISION AND DRAINAGE and debridement LEFT BREAST ABSCESS;  Surgeon: Odis Hollingshead, MD;  Location: [facility];  Service: General;  Laterality: Left;  . LEFT HEART CATH AND CORONARY ANGIOGRAPHY N/A 08/10/2017   Procedure: LEFT HEART CATH AND CORONARY ANGIOGRAPHY;  Surgeon: Jettie Booze, MD;  Location: [facility];  Service: Cardiovascular;  Laterality: N/A;     Social History   reports that she has been smoking cigarettes. She has a 26.00 pack-year smoking history. She has never used smokeless tobacco. She reports that she does not drink alcohol or use drugs.   Family History   Her family history includes Breast cancer in her mother; Cancer in her mother; Hypertension in her  father; Thyroid disease in her Tracie Estrada.   Allergies Allergies  Allergen Reactions  . Fish Allergy Anaphylaxis  . Peanuts [Peanut Oil] Anaphylaxis  . Other Other (See Comments)    Anesthesia.  Specific agent unknown.  Reaction unknown.   Marland Kitchen Penicillins Itching    Did it involve swelling of the face/tongue/throat, SOB, or low BP? Unk Did it involve sudden or severe rash/hives, skin peeling, or any reaction on the inside of your mouth or nose? Unk Did you need to seek medical attention at a hospital or doctor's office? Unk When did it last happen? Unk If all above answers are "NO", may proceed with cephalosporin use.   . Vancomycin Other (See Comments)    Nephrotoxicity     Home Medications  Prior to Admission medications   Medication Sig Start Date End Date Taking? Authorizing Provider  amLODipine (NORVASC) 10 MG tablet TAKE 1 TABLET (10 MG TOTAL) BY MOUTH DAILY. Patient taking differently: Take 10 mg by mouth daily.  07/29/17   Tresa Garter, MD  aspirin EC 81 MG tablet Take 1 tablet (81 mg total) by mouth daily. 11/20/16   Tresa Garter, MD  atorvastatin (LIPITOR) 40 MG tablet Take 1 tablet (40 mg total) by mouth daily at 6 PM. 08/11/17   Jani Gravel, MD  chlorthalidone (HYGROTON) 25 MG tablet Take 2 tablets (50 mg total) by mouth daily. 01/02/19   Henderly, Britni A, PA-C  ciprofloxacin (CIPRO) 500 MG tablet Take 1 tablet (500 mg total) by mouth every 12 (twelve) hours. 01/02/19   Henderly, Britni A, PA-C  clindamycin (CLEOCIN) 300 MG capsule Take 1 capsule (300 mg total) by mouth 4 (four) times daily. X 7 days 03/14/18   Drenda Freeze, MD  cloNIDine (CATAPRES) 0.1 MG tablet Take 1 tablet (0.1 mg total) by mouth 3 (three) times daily as needed (sbp >160). 08/11/17   Jani Gravel, MD  furosemide (LASIX) 40 MG tablet Take 1 tablet (40 mg total) by mouth daily. 05/05/18   Veryl Speak, MD  hydrALAZINE (APRESOLINE) 100 MG tablet Take 1 tablet (  100 mg total) by mouth every 8  (eight) hours. 08/11/17   Jani Gravel, MD  hydrochlorothiazide (HYDRODIURIL) 25 MG tablet Take 25 mg by mouth daily.    [provider]  metoprolol tartrate (LOPRESSOR) 50 MG tablet Take 1 tablet (50 mg total) by mouth 2 (two) times daily. 07/29/17   Tresa Garter, MD  nitroGLYCERIN (NITROSTAT) 0.4 MG SL tablet Place 1 tablet (0.4 mg total) under the tongue every 5 (five) minutes x 3 doses as needed for chest pain. 08/11/17   Jani Gravel, MD  potassium chloride (K-DUR) 10 MEQ tablet Take 1 tablet (10 mEq total) by mouth daily. 07/29/17   Tresa Garter, MD  potassium chloride SA (K-DUR,KLOR-CON) 20 MEQ tablet Take 1 tablet (20 mEq total) by mouth 2 (two) times daily. 05/05/18   Veryl Speak, MD  promethazine (PHENERGAN) 12.5 MG tablet Take 1 tablet (12.5 mg total) by mouth every 8 (eight) hours as needed for nausea or vomiting. 01/02/19   Henderly, Britni A, PA-C  spironolactone (ALDACTONE) 25 MG tablet Take 1 tablet (25 mg total) by mouth daily for 30 days. 01/02/19 03/13/2019  Henderly, Britni A, PA-C  sucralfate (CARAFATE) 1 GM/10ML suspension Take 10 mLs (1 g total) by mouth 4 (four) times daily -  with meals and at bedtime. 04/12/18   Long, Wonda Olds, MD  triamcinolone ointment (KENALOG) 0.5 % Apply 1 application topically 2 (two) times daily.  08/21/17   [provider]  bisoprolol-hydrochlorothiazide (ZIAC) 5-6.25 MG tablet Take 1 tablet by mouth daily. 08/25/16 09/04/16  Orlie Dakin, MD     Critical care time: 24 mins    Kennieth Rad, MSN, AGACNP-BC [facility] Pgr: [phone] or if no answer [phone] 03/10/2019, 11:01 PM  Patient seen and examined, agree with above note.  I dictated the care and orders written for this patient under my direction. Catastrophic Insular brain bleed likely secondary to HTN. GCS of 3 , no reflexes , no gag , not breathing over vent Will proceed with apnea test when she is normothermic. DNR Sister updated at bedside.   Roxanne Mins, [place]

## 2019-02-24 NOTE — ED Notes (Signed)
ED TO INPATIENT HANDOFF REPORT  ED Nurse Name and Phone #: Gartner [phone]  S Name/Age/Gender Tracie Estrada 53 y.o. female Room/Bed: RESUSC/RESUSC  Code Status   Code Status: DNR  Home/SNF/Other   Triage Complete: Triage complete  Chief Complaint code stroke  Triage Note Pt here for an activated Code Stroke where EMS observed left sided weakness with right sided gaze.  Pt was awake and alert on EMS arrival.  When EMS got patient loaded and onto the truck pt vomited and went unresponsive.  Pt had 3 rounds of emesis then went apneic.  Pulses lost 5 min after emesis' episodes.  CPR initiated for 5 min with ROSC on arrival to the ED. 2 Epi given.  King airway in place.  18 g IV with IO in place.  Unresponsive with no movement on arrival.  ETT placed by ED MD.     Allergies Allergies  Allergen Reactions  . Fish Allergy Anaphylaxis  . Peanuts [Peanut Oil] Anaphylaxis  . Other Other (See Comments)    Anesthesia.  Specific agent unknown.  Reaction unknown.   Marland Kitchen Penicillins Itching    Did it involve swelling of the face/tongue/throat, SOB, or low BP? Unk Did it involve sudden or severe rash/hives, skin peeling, or any reaction on the inside of your mouth or nose? Unk Did you need to seek medical attention at a hospital or doctor's office? Unk When did it last happen? Unk If all above answers are "NO", may proceed with cephalosporin use.   . Vancomycin Other (See Comments)    Nephrotoxicity    Level of Care/Admitting Diagnosis ED Disposition    ED Disposition Condition Comment   Admit  Hospital Area: [facility] [[id number]]  Level of Care: ICU [6]  Diagnosis: Cardiac arrest ([place]) [427.5.ICD-9-CM]  Admitting Physician: Roxanne Mins [[id number]]  Attending Physician: Roxanne Mins [[id number]]  Estimated length of stay: past midnight tomorrow  Certification:: I certify this patient will need inpatient services for at least 2 midnights  PT Class (Do Not Modify):  Inpatient [101]  PT Acc Code (Do Not Modify): Private [1]       B Medical/Surgery History Past Medical History:  Diagnosis Date  . Acute renal failure ([place]) 10/13/2013   On Vancomycin  . Breast abscess of female    Status post biopsy  . Hypertension   . Malignant hypertension 10/13/2013   Past Surgical History:  Procedure Laterality Date  . Biopsy of left breast    . INCISION AND DRAINAGE ABSCESS Left 10/11/2013   Procedure: INCISION AND DRAINAGE and debridement LEFT BREAST ABSCESS;  Surgeon: Odis Hollingshead, MD;  Location: [facility];  Service: General;  Laterality: Left;  . LEFT HEART CATH AND CORONARY ANGIOGRAPHY N/A 08/10/2017   Procedure: LEFT HEART CATH AND CORONARY ANGIOGRAPHY;  Surgeon: Jettie Booze, MD;  Location: [facility];  Service: Cardiovascular;  Laterality: N/A;     A IV Location/Drains/Wounds Patient Lines/Drains/Airways Status   Active Line/Drains/Airways    Name:   Placement date:   Placement time:   Site:   Days:   Peripheral IV 03/11/2019 Left Antecubital   03/22/2019    -    Antecubital   less than 1   Peripheral IV 03/16/2019 Right Antecubital   03/20/2019    1944    Antecubital   less than 1   Sheath 08/10/17 Right Arterial;Radial   08/10/17    3149    FWYOVZCH;YIFOYD   741  Airway 7.5 mm   03/05/2019    1942     less than 1          Intake/Output Last 24 hours No intake or output data in the 24 hours ending 02/28/2019 2218  Labs/Imaging Results for orders placed or performed during the hospital encounter of 03/13/2019 (from the past 48 hour(s))  Ethanol     Status: None   Collection Time: 03/17/2019  7:45 PM  Result Value Ref Range   Alcohol, Ethyl (B) <10 <10 mg/dL    Comment: (NOTE) Lowest detectable limit for serum alcohol is 10 mg/dL. For medical purposes only. Performed at [facility], [place] [address], [place], [place] [address]   CBC     Status: Abnormal   Collection Time: 02/28/2019  7:45 PM  Result Value Ref Range   WBC 11.5  (H) 4.0 - 10.5 K/uL   RBC 4.85 3.87 - 5.11 MIL/uL   Hemoglobin 14.4 12.0 - 15.0 g/dL   HCT 45.3 36.0 - 46.0 %   MCV 93.4 80.0 - 100.0 fL   MCH 29.7 26.0 - 34.0 pg   MCHC 31.8 30.0 - 36.0 g/dL   RDW 15.5 11.5 - 15.5 %   Platelets 252 150 - 400 K/uL   nRBC 0.0 0.0 - 0.2 %    Comment: Performed at [facility], [place] [address] [place], [place] [address]  Differential     Status: Abnormal   Collection Time: 03/07/2019  7:45 PM  Result Value Ref Range   Neutrophils Relative % 46 %   Neutro Abs 5.3 1.7 - 7.7 K/uL   Lymphocytes Relative 45 %   Lymphs Abs 5.2 (H) 0.7 - 4.0 K/uL   Monocytes Relative 5 %   Monocytes Absolute 0.6 0.1 - 1.0 K/uL   Eosinophils Relative 2 %   Eosinophils Absolute 0.2 0.0 - 0.5 K/uL   Basophils Relative 0 %   Basophils Absolute 0.1 0.0 - 0.1 K/uL   Immature Granulocytes 2 %   Abs Immature Granulocytes 0.20 (H) 0.00 - 0.07 K/uL    Comment: Performed at [facility] [address], [place], [place] [address]  Comprehensive metabolic panel     Status: Abnormal   Collection Time: 03/20/2019  7:45 PM  Result Value Ref Range   Sodium 138 135 - 145 mmol/L   Potassium 2.9 (L) 3.5 - 5.1 mmol/L    Comment: HEMOLYSIS AT THIS LEVEL MAY AFFECT RESULT QUESTIONABLE RESULTS, RECOMMEND RECOLLECT TO VERIFY CALLED TO RN    Chloride 97 (L) 98 - 111 mmol/L   CO2 25 22 - 32 mmol/L   Glucose, Bld 247 (H) 70 - 99 mg/dL   BUN 16 6 - 20 mg/dL   Creatinine, Ser 1.70 (H) 0.44 - 1.00 mg/dL   Calcium 8.6 (L) 8.9 - 10.3 mg/dL   Total Protein 6.6 6.5 - 8.1 g/dL   Albumin 3.6 3.5 - 5.0 g/dL   AST 81 (H) 15 - 41 U/L   ALT 66 (H) 0 - 44 U/L   Alkaline Phosphatase 56 38 - 126 U/L   Total Bilirubin 0.8 0.3 - 1.2 mg/dL   GFR calc non Af Amer 34 (L) >60 mL/min   GFR calc Af Amer 39 (L) >60 mL/min   Anion gap 16 (H) 5 - 15    Comment: Performed at [facility], [place] [address] [place], [place] [address]  Troponin I - ONCE - STAT  Status: Abnormal   Collection Time:  03/02/2019  7:45 PM  Result Value Ref Range   Troponin I 0.04 (HH) <0.03 ng/mL    Comment: CRITICAL RESULT CALLED TO, READ BACK BY AND VERIFIED WITH: K [facility] 2055 03/16/2019 WBOND Performed at [facility], [place] [address] [place], [place] [address]   I-Stat beta hCG blood, ED     Status: None   Collection Time: 02/28/2019  7:54 PM  Result Value Ref Range   I-stat hCG, quantitative <5.0 <5 mIU/mL   Comment 3            Comment:   GEST. AGE      CONC.  (mIU/mL)   <=1 WEEK        5 - 50     2 WEEKS       50 - 500     3 WEEKS       100 - 10,000     4 WEEKS     1,000 - 30,000        FEMALE AND NON-PREGNANT FEMALE:     LESS THAN 5 mIU/mL   I-stat Creatinine, ED     Status: Abnormal   Collection Time: 03/02/2019  7:56 PM  Result Value Ref Range   Creatinine, Ser 1.60 (H) 0.44 - 1.00 mg/dL  I-STAT 7, (LYTES, BLD GAS, ICA, H+H)     Status: Abnormal   Collection Time: 02/28/2019  8:53 PM  Result Value Ref Range   pH, Arterial 7.278 (L) 7.350 - 7.450   pCO2 arterial 52.1 (H) 32.0 - 48.0 mmHg   pO2, Arterial 86.0 83.0 - 108.0 mmHg   Bicarbonate 24.7 20.0 - 28.0 mmol/L   TCO2 26 22 - 32 mmol/L   O2 Saturation 96.0 %   Acid-base deficit 3.0 (H) 0.0 - 2.0 mmol/L   Sodium 137 135 - 145 mmol/L   Potassium 3.1 (L) 3.5 - 5.1 mmol/L   Calcium, Ion 1.11 (L) 1.15 - 1.40 mmol/L   HCT 35.0 (L) 36.0 - 46.0 %   Hemoglobin 11.9 (L) 12.0 - 15.0 g/dL   Patient temperature 96.4 F    Collection site RADIAL, ALLEN'S TEST ACCEPTABLE    Drawn by RT    Sample type ARTERIAL   Protime-INR     Status: Abnormal   Collection Time: 02/26/2019  9:08 PM  Result Value Ref Range   Prothrombin Time 15.5 (H) 11.4 - 15.2 seconds   INR 1.2 0.8 - 1.2    Comment: (NOTE) INR goal varies based on device and disease states. Performed at [facility], [place] [address], [place], [place] [address]   APTT     Status: None   Collection Time: 03/20/2019  9:08 PM  Result Value Ref Range   aPTT 27 24 - 36 seconds     Comment: Performed at [facility] [address], [place], [place] [address]   Ct Angio Head W Or Wo Contrast  Result Date: 03/22/2019 CLINICAL DATA:  Follow up intracranial hemorrhage. History of hypertension. EXAM: CT ANGIOGRAPHY HEAD AND NECK TECHNIQUE: Multidetector CT imaging of the head and neck was performed using the standard protocol during bolus administration of intravenous contrast. Multiplanar CT image reconstructions and MIPs were obtained to evaluate the vascular anatomy. Carotid stenosis measurements (when applicable) are obtained utilizing NASCET criteria, using the distal internal carotid diameter as the denominator. COMPARISON:  CT HEAD February 24, 2019 and CT neck March 14, 2018 FINDINGS: Examination was performed  3 times as contrast was not well demonstrated intracranially. CTA NECK FINDINGS: AORTIC ARCH: Normal appearance of the thoracic arch, normal branch pattern. Mild calcific atherosclerosis aortic arch. The origins of the innominate, left Common carotid artery and subclavian artery are patent. RIGHT CAROTID SYSTEM: Common carotid artery is patent, mild luminal irregularity compatible with atherosclerosis. Normal appearance of the carotid bifurcation without hemodynamically significant stenosis by NASCET criteria. Gradual loss of contrast opacification internal cerebral artery. LEFT CAROTID SYSTEM: Common carotid artery is patent, mild luminal irregularity compatible with atherosclerosis. Normal appearance of the carotid bifurcation without hemodynamically significant stenosis by NASCET criteria. Gradual loss of contrast internal cerebral artery. VERTEBRAL ARTERIES:RIGHT vertebral artery is dominant. Gradual loss of bilateral vertebral arteries contrast opacification at V2 segment. SKELETON: See below.  Small LEFT trapezius intramuscular lipoma. OTHER NECK: Soft tissues of the neck are nonacute though, not tailored for evaluation. UPPER CHEST: Heart is enlarged, incompletely  assessed. Dependent consolidation with pulmonary vascular congestion most compatible with pulmonary edema. Partially imaged suspected RIGHT hilar lymphadenopathy. Endotracheal tube tip immediately above the carina. CTA HEAD FINDINGS: Limited assessment due to poor intracranial contrast. ANTERIOR CIRCULATION: Bilateral internal cerebral arteries are patent. Narrowed and irregular anterior middle cerebral arteries old thus appearance of the RIGHT MCA bifurcation with into cutoff. In addition, bulbous A2 3 junction measuring to 2 mm. POSTERIOR CIRCULATION: No discernible contrast within the posterior circulation. VENOUS SINUSES: Contrast within the superior sagittal sinus and to lesser extent transverse sinus, likely narrowed due to intracranial hypertension. ANATOMIC VARIANTS: None. DELAYED PHASE: No abnormal intracranial enhancement though limited by presence of acute blood products. MIP images reviewed. CT CERVICAL SPINE: ALIGNMENT: Straightened lordosis.  No malalignment. SKULL BASE AND VERTEBRAE: Cervical vertebral bodies and posterior elements are intact. Severe C3-4 disc height loss with endplate sclerosis and marginal spurring compatible with degenerative discs. Moderate to severe C4-5 and moderate C6-7 degenerative discs. C1-2 articulation maintained. No destructive bony lesions. SOFT TISSUES AND SPINAL CANAL: See above. DISC LEVELS: Moderate canal stenosis C3-4. No high-grade neural foraminal narrowing. UPPER CHEST: Lung apices are clear. OTHER: None. IMPRESSION: CTA NECK: 1. No hemodynamically significant stenosis ICA's. 2. Patent proximal vertebral arteries. 3. Cardiomegaly and findings of pulmonary edema. CTA HEAD: 1. Poor intravascular contrast attributable to poor cardiac output and increased intracranial pressure, minimally technically improved after 3 attempts. No contrast opacification of posterior circulation. 2. Patent ICA, anterior middle cerebral arteries with attenuated appearance attributable to  and intracranial hypertension, possible component of vasospasm. 3. 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 CT CERVICAL SPINE: 1. No fracture or malalignment. 2. Moderate canal stenosis C3-4. Critical Value/emergent results were called by telephone at the time of interpretation on 02/23/2019 at 8:30 pm to Dr. Leonel Ramsay, Neurology, who verbally acknowledged these results. Aortic Atherosclerosis (ICD10-I70.0). Electronically Signed   By: Elon Alas M.D.   On: 03/17/2019 20:59   Ct Angio Neck W And/or Wo Contrast  Result Date: 03/06/2019 CLINICAL DATA:  Follow up intracranial hemorrhage. History of hypertension. EXAM: CT ANGIOGRAPHY HEAD AND NECK TECHNIQUE: Multidetector CT imaging of the head and neck was performed using the standard protocol during bolus administration of intravenous contrast. Multiplanar CT image reconstructions and MIPs were obtained to evaluate the vascular anatomy. Carotid stenosis measurements (when applicable) are obtained utilizing NASCET criteria, using the distal internal carotid diameter as the denominator. COMPARISON:  CT HEAD February 24, 2019 and CT neck March 14, 2018 FINDINGS: Examination was performed 3 times as contrast  was not well demonstrated intracranially. CTA NECK FINDINGS: AORTIC ARCH: Normal appearance of the thoracic arch, normal branch pattern. Mild calcific atherosclerosis aortic arch. The origins of the innominate, left Common carotid artery and subclavian artery are patent. RIGHT CAROTID SYSTEM: Common carotid artery is patent, mild luminal irregularity compatible with atherosclerosis. Normal appearance of the carotid bifurcation without hemodynamically significant stenosis by NASCET criteria. Gradual loss of contrast opacification internal cerebral artery. LEFT CAROTID SYSTEM: Common carotid artery is patent, mild luminal irregularity compatible with atherosclerosis. Normal appearance of  the carotid bifurcation without hemodynamically significant stenosis by NASCET criteria. Gradual loss of contrast internal cerebral artery. VERTEBRAL ARTERIES:RIGHT vertebral artery is dominant. Gradual loss of bilateral vertebral arteries contrast opacification at V2 segment. SKELETON: See below.  Small LEFT trapezius intramuscular lipoma. OTHER NECK: Soft tissues of the neck are nonacute though, not tailored for evaluation. UPPER CHEST: Heart is enlarged, incompletely assessed. Dependent consolidation with pulmonary vascular congestion most compatible with pulmonary edema. Partially imaged suspected RIGHT hilar lymphadenopathy. Endotracheal tube tip immediately above the carina. CTA HEAD FINDINGS: Limited assessment due to poor intracranial contrast. ANTERIOR CIRCULATION: Bilateral internal cerebral arteries are patent. Narrowed and irregular anterior middle cerebral arteries old thus appearance of the RIGHT MCA bifurcation with into cutoff. In addition, bulbous A2 3 junction measuring to 2 mm. POSTERIOR CIRCULATION: No discernible contrast within the posterior circulation. VENOUS SINUSES: Contrast within the superior sagittal sinus and to lesser extent transverse sinus, likely narrowed due to intracranial hypertension. ANATOMIC VARIANTS: None. DELAYED PHASE: No abnormal intracranial enhancement though limited by presence of acute blood products. MIP images reviewed. CT CERVICAL SPINE: ALIGNMENT: Straightened lordosis.  No malalignment. SKULL BASE AND VERTEBRAE: Cervical vertebral bodies and posterior elements are intact. Severe C3-4 disc height loss with endplate sclerosis and marginal spurring compatible with degenerative discs. Moderate to severe C4-5 and moderate C6-7 degenerative discs. C1-2 articulation maintained. No destructive bony lesions. SOFT TISSUES AND SPINAL CANAL: See above. DISC LEVELS: Moderate canal stenosis C3-4. No high-grade neural foraminal narrowing. UPPER CHEST: Lung apices are clear.  OTHER: None. IMPRESSION: CTA NECK: 1. No hemodynamically significant stenosis ICA's. 2. Patent proximal vertebral arteries. 3. Cardiomegaly and findings of pulmonary edema. CTA HEAD: 1. Poor intravascular contrast attributable to poor cardiac output and increased intracranial pressure, minimally technically improved after 3 attempts. No contrast opacification of posterior circulation. 2. Patent ICA, anterior middle cerebral arteries with attenuated appearance attributable to and intracranial hypertension, possible component of vasospasm. 3. 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 CT CERVICAL SPINE: 1. No fracture or malalignment. 2. Moderate canal stenosis C3-4. Critical Value/emergent results were called by telephone at the time of interpretation on 03/18/2019 at 8:30 pm to Dr. Leonel Ramsay, Neurology, who verbally acknowledged these results. Aortic Atherosclerosis (ICD10-I70.0). Electronically Signed   By: Elon Alas M.D.   On: 02/23/2019 20:59   Ct C-spine No Charge  Result Date: 02/25/2019 CLINICAL DATA:  Follow up intracranial hemorrhage. History of hypertension. EXAM: CT ANGIOGRAPHY HEAD AND NECK TECHNIQUE: Multidetector CT imaging of the head and neck was performed using the standard protocol during bolus administration of intravenous contrast. Multiplanar CT image reconstructions and MIPs were obtained to evaluate the vascular anatomy. Carotid stenosis measurements (when applicable) are obtained utilizing NASCET criteria, using the distal internal carotid diameter as the denominator. COMPARISON:  CT HEAD February 24, 2019 and CT neck March 14, 2018 FINDINGS: Examination was performed 3 times as contrast was not well demonstrated intracranially. CTA NECK  FINDINGS: AORTIC ARCH: Normal appearance of the thoracic arch, normal branch pattern. Mild calcific atherosclerosis aortic arch. The origins of the innominate, left Common carotid  artery and subclavian artery are patent. RIGHT CAROTID SYSTEM: Common carotid artery is patent, mild luminal irregularity compatible with atherosclerosis. Normal appearance of the carotid bifurcation without hemodynamically significant stenosis by NASCET criteria. Gradual loss of contrast opacification internal cerebral artery. LEFT CAROTID SYSTEM: Common carotid artery is patent, mild luminal irregularity compatible with atherosclerosis. Normal appearance of the carotid bifurcation without hemodynamically significant stenosis by NASCET criteria. Gradual loss of contrast internal cerebral artery. VERTEBRAL ARTERIES:RIGHT vertebral artery is dominant. Gradual loss of bilateral vertebral arteries contrast opacification at V2 segment. SKELETON: See below.  Small LEFT trapezius intramuscular lipoma. OTHER NECK: Soft tissues of the neck are nonacute though, not tailored for evaluation. UPPER CHEST: Heart is enlarged, incompletely assessed. Dependent consolidation with pulmonary vascular congestion most compatible with pulmonary edema. Partially imaged suspected RIGHT hilar lymphadenopathy. Endotracheal tube tip immediately above the carina. CTA HEAD FINDINGS: Limited assessment due to poor intracranial contrast. ANTERIOR CIRCULATION: Bilateral internal cerebral arteries are patent. Narrowed and irregular anterior middle cerebral arteries old thus appearance of the RIGHT MCA bifurcation with into cutoff. In addition, bulbous A2 3 junction measuring to 2 mm. POSTERIOR CIRCULATION: No discernible contrast within the posterior circulation. VENOUS SINUSES: Contrast within the superior sagittal sinus and to lesser extent transverse sinus, likely narrowed due to intracranial hypertension. ANATOMIC VARIANTS: None. DELAYED PHASE: No abnormal intracranial enhancement though limited by presence of acute blood products. MIP images reviewed. CT CERVICAL SPINE: ALIGNMENT: Straightened lordosis.  No malalignment. SKULL BASE AND  VERTEBRAE: Cervical vertebral bodies and posterior elements are intact. Severe C3-4 disc height loss with endplate sclerosis and marginal spurring compatible with degenerative discs. Moderate to severe C4-5 and moderate C6-7 degenerative discs. C1-2 articulation maintained. No destructive bony lesions. SOFT TISSUES AND SPINAL CANAL: See above. DISC LEVELS: Moderate canal stenosis C3-4. No high-grade neural foraminal narrowing. UPPER CHEST: Lung apices are clear. OTHER: None. IMPRESSION: CTA NECK: 1. No hemodynamically significant stenosis ICA's. 2. Patent proximal vertebral arteries. 3. Cardiomegaly and findings of pulmonary edema. CTA HEAD: 1. Poor intravascular contrast attributable to poor cardiac output and increased intracranial pressure, minimally technically improved after 3 attempts. No contrast opacification of posterior circulation. 2. Patent ICA, anterior middle cerebral arteries with attenuated appearance attributable to and intracranial hypertension, possible component of vasospasm. 3. 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 CT CERVICAL SPINE: 1. No fracture or malalignment. 2. Moderate canal stenosis C3-4. Critical Value/emergent results were called by telephone at the time of interpretation on 03/09/2019 at 8:30 pm to Dr. Leonel Ramsay, Neurology, who verbally acknowledged these results. Aortic Atherosclerosis (ICD10-I70.0). Electronically Signed   By: Elon Alas M.D.   On: 02/26/2019 20:59   Dg Chest Port 1 View  Result Date: 03/02/2019 CLINICAL DATA:  Initial evaluation for intubation. EXAM: PORTABLE CHEST 1 VIEW COMPARISON:  Prior radiograph from 05/05/2018. FINDINGS: Endotracheal tube in place with tip positioned 1.9 cm above the carina. Moderate cardiomegaly, stable. Mediastinal silhouette within normal limits. Lungs hypoinflated. Mild diffuse pulmonary vascular congestion without overt pulmonary edema. No focal  infiltrates. No pleural effusion. No pneumothorax. No acute osseous finding. IMPRESSION: 1. Tip of the endotracheal tube positioned 1.9 cm above the carina. 2. Cardiomegaly with mild diffuse pulmonary vascular congestion without overt pulmonary edema. Electronically Signed   By: Jeannine Boga M.D.   On: 02/28/2019 20:14  Ct Head Code Stroke Wo Contrast  Result Date: 02/23/2019 CLINICAL DATA:  Code stroke.  RIGHT-sided weakness, facial droop. EXAM: CT HEAD WITHOUT CONTRAST TECHNIQUE: Contiguous axial images were obtained from the base of the skull through the vertex without intravenous contrast. COMPARISON:  CT HEAD September 30, 2010 FINDINGS: BRAIN: 4.1 x 4.5 x 5.8 cm (volume = 56 cc) ( RIGHT frontotemporal/insular hematoma with surrounding low-density vasogenic edema. 7 mm RIGHT to LEFT midline shift. Effaced RIGHT lateral ventricle without intraventricular extension. Mild global parenchymal edema with sulcal effacement. Small volume RIGHT subarachnoid hemorrhage. Basal cisterns effaced. VASCULAR: Unremarkable. SKULL/SOFT TISSUES: No skull fracture. Old bilateral nasal bone fractures. Expanded empty sella. No significant soft tissue swelling. ORBITS/SINUSES: Old RIGHT medial orbital blowout fracture. Moderate paranasal sinus mucosal thickening. Mastoid air cells are well aerated. OTHER: None. ASPECTS [facility] Stroke Program Early CT Score) -not applicable. IMPRESSION: 1. Acute 56 cc frontotemporal/insular acute hemorrhage. 7 mm RIGHT to LEFT midline shift. Global edema. 2. Small volume RIGHT subarachnoid hemorrhage. 3. Critical Value/emergent results were called by telephone at the time of interpretation on 02/23/2019 at 8:13 pm to Dr. Shirlyn Goltz , who verbally acknowledged these results. 4. Critical Value/emergent results text paged to Coffe[place], Neurology via [employer] secure system on 02/25/2019 at 8:10 pm, including interpreting physician's phone number. Electronically Signed   By: Elon Alas M.D.    On: 03/17/2019 20:14    Pending Labs Unresulted Labs (From admission, onward)    Start     Ordered   02/25/19 0500  CBC  Tomorrow morning,   R     03/12/2019 2206   02/25/19 1610  Basic metabolic panel  Tomorrow morning,   R     02/28/2019 2206   02/25/19 0500  Magnesium  Tomorrow morning,   R     02/23/2019 2206   02/25/19 0500  Phosphorus  Tomorrow morning,   R     03/21/2019 2206   02/25/19 0500  Blood gas, arterial  Tomorrow morning,   R     03/05/2019 2206   02/23/2019 2151  HIV antibody (Routine Testing)  Once,   R     02/25/2019 2206   02/26/2019 1945  Urine rapid drug screen (hosp performed)  ONCE - STAT,   STAT     02/23/2019 1945   02/28/2019 1947  Urinalysis, Routine w reflex microscopic  ONCE - STAT,   STAT     03/08/2019 1945          Vitals/Pain Today's Vitals   02/28/2019 2100 03/07/2019 2130 03/04/2019 2200 03/21/2019 2215  BP: (!) 80/51 (!) 86/49 93/63 (!) 89/54  Pulse:  61    Resp: 13 12    SpO2: 97% 99% 99% 100%    Isolation Precautions No active isolations  Medications Medications  0.9 %  sodium chloride infusion (1,000 mLs Intravenous New Bag/Given 03/10/2019 1950)  pantoprazole (PROTONIX) injection 40 mg (has no administration in time range)  lactated ringers infusion (has no administration in time range)  sodium chloride 0.9 % bolus 2,000 mL (has no administration in time range)  diltiazem (CARDIZEM) injection 20 mg (20 mg Intravenous Given 03/20/2019 1949)  sodium chloride 0.9 % bolus 1,000 mL (1,000 mLs Intravenous New Bag/Given 03/22/2019 2041)  dexamethasone (DECADRON) injection 10 mg (10 mg Intravenous Given 03/14/2019 2045)  levETIRAcetam (KEPPRA) IVPB 1000 mg/100 mL premix (0 mg Intravenous Stopped 03/23/2019 2217)  sodium chloride 0.9 % bolus 1,000 mL (1,000 mLs Intravenous New Bag/Given 02/26/2019 2043)  iohexol (OMNIPAQUE) 350  MG/ML injection 150 mL (150 mLs Intravenous Contrast Given 02/23/2019 2020)    Mobility non-ambulatory     Focused Assessments   R Recommendations: See  Admitting Provider Note  Report given to:   Additional Notes:

## 2019-02-24 NOTE — Consult Note (Signed)
Neurology Consultation Reason for Consult: Code stroke Referring Physician: Darl Householder, D  CC: Unresponsiveness  History is obtained from: Daughter  HPI: Tracie Estrada is a 53 y.o. female who was in her normal state of health earlier today.  Her daughter went upstairs to use the bathroom and when she came back down, she found her mother on the ground unresponsive.  She called 911.  On EMS arrival, they stated that she was awake and alert, had 3 rounds of emesis and left-sided weakness.  They activated a code stroke, however the patient had cardiac arrest on route.  She went apneic and then had a PEA arrest and was intubated with a King airway.  A Lucas device was placed and was present on arrival, though they had return of circulation by that point.   LKW:?  6 PM tpa given?: no, ICH ICH Score: 3    ROS: Unable to obtain due to altered mental status.   Past Medical History:  Diagnosis Date  . Acute renal failure ([place]) 10/13/2013   On Vancomycin  . Breast abscess of female    Status post biopsy  . Hypertension   . Malignant hypertension 10/13/2013     Family History  Problem Relation Age of Onset  . Breast cancer Mother   . Cancer Mother        brain  . Hypertension Father   . Thyroid disease Daughter      Social History:  reports that she has been smoking cigarettes. She has a 26.00 pack-year smoking history. She has never used smokeless tobacco. She reports that she does not drink alcohol or use drugs.   Exam: Current vital signs: BP (!) 80/51   Pulse 84   Resp 13   LMP 11/13/2017   SpO2 97%  Vital signs in last 24 hours: Pulse Rate:  [84] 84 (04/02 1945) Resp:  [13-27] 13 (04/02 2100) BP: (80-137)/(51-78) 80/51 (04/02 2100) SpO2:  [95 %-100 %] 97 % (04/02 2100) FiO2 (%):  [100 %] 100 % (04/02 1945)   Physical Exam  Constitutional: Appears well-developed and well-nourished.  Psych: Unresponsive Eyes: No scleral injection HENT: ET tube in place Head:  Normocephalic.  Cardiovascular: Normal rate and regular rhythm.  Respiratory: Ventilated GI: Soft.  No distension. There is no tenderness.  Skin: WDI  Neuro: Mental Status: Patient is densely comatose, no opening eyes or following commands cranial Nerves: II:  Pupils are fixed and midposition bilaterally III,IV, VI: No response to doll's eye maneuver V:VII: Corneals are absent X: Cough/gag absent Motor: She does not move to noxious stimuli in the upper extremities, minimal extensor posturing to noxious stimulation of the right lower extremity Sensory: As above Plantars: Toes are downgoing bilaterally.  Cerebellar: Does not perform  I have reviewed labs in epic and the results pertinent to this consultation are: CMP-creatinine 1.7  I have reviewed the images obtained: Large hematoma on the right, there appears to be hyperdensity throughout the circle of Willis, extending into the left insula, ? SAH versus artifact of diffuse edema  Impression: 53 year old female with large hematoma.  One possibility includes that this is an aneurysmal subarachnoid with the aneurysm not being well visualized due to the poor opacification of the cerebral vasculature on CTA.  After discussion with the radiologist, however, I think that this might be artifactual hyperdensity due to vascular congestion and therefore this would represent a hypertensive hemorrhage with the increase in ICP contributing to tenuous cerebral perfusion and after cardiac arrest, global  ischemia resulting in global edema which is now responsible for herniation.  Either way, she would not survive this.  At the time of my exam, she still had minimal extension of her right lower extremity, but I suspect that she will progress to brain death quickly.  If this goes away, then an apnea test be performed, and neurosurgery has recommended cerebral perfusion study.  Recommendations: 1) I suspect that she will progress to brain death  2)  hypertension control with blood pressure goal less than 747 systolic   Roland Rack, MD [facility] [phone]  If 7pm- 7am, please page neurology on call as listed in [place].

## 2019-02-24 NOTE — ED Notes (Signed)
Spoke with Magda Paganini with Donor services to provide further information on pt for potential organ donation

## 2019-02-24 NOTE — ED Notes (Signed)
Attempted OG placement x2 with glidescope and NG placement x2 unsuccessful

## 2019-02-25 ENCOUNTER — Encounter (HOSPITAL_COMMUNITY): Payer: Self-pay

## 2019-02-25 DIAGNOSIS — G936 Cerebral edema: Secondary | ICD-10-CM

## 2019-02-25 DIAGNOSIS — I610 Nontraumatic intracerebral hemorrhage in hemisphere, subcortical: Secondary | ICD-10-CM

## 2019-02-25 DIAGNOSIS — G935 Compression of brain: Secondary | ICD-10-CM

## 2019-02-25 DIAGNOSIS — R4020 Unspecified coma: Secondary | ICD-10-CM

## 2019-02-25 DIAGNOSIS — I619 Nontraumatic intracerebral hemorrhage, unspecified: Secondary | ICD-10-CM

## 2019-02-25 LAB — URINALYSIS, ROUTINE W REFLEX MICROSCOPIC
Bilirubin Urine: NEGATIVE
Glucose, UA: 50 mg/dL — AB
Ketones, ur: NEGATIVE mg/dL
Leukocytes,Ua: NEGATIVE
Nitrite: NEGATIVE
Protein, ur: 100 mg/dL — AB
Specific Gravity, Urine: 1.019 (ref 1.005–1.030)
pH: 9 — ABNORMAL HIGH (ref 5.0–8.0)

## 2019-02-25 LAB — RAPID URINE DRUG SCREEN, HOSP PERFORMED
Amphetamines: NOT DETECTED
Barbiturates: NOT DETECTED
Benzodiazepines: NOT DETECTED
Cocaine: NOT DETECTED
Opiates: NOT DETECTED
Tetrahydrocannabinol: NOT DETECTED

## 2019-02-25 LAB — CBC
HCT: 42.3 % (ref 36.0–46.0)
Hemoglobin: 14.2 g/dL (ref 12.0–15.0)
MCH: 29.6 pg (ref 26.0–34.0)
MCHC: 33.6 g/dL (ref 30.0–36.0)
MCV: 88.3 fL (ref 80.0–100.0)
Platelets: 226 10*3/uL (ref 150–400)
RBC: 4.79 MIL/uL (ref 3.87–5.11)
RDW: 15.7 % — ABNORMAL HIGH (ref 11.5–15.5)
WBC: 21.8 10*3/uL — ABNORMAL HIGH (ref 4.0–10.5)
nRBC: 0 % (ref 0.0–0.2)

## 2019-02-25 LAB — BASIC METABOLIC PANEL
Anion gap: 8 (ref 5–15)
BUN: 20 mg/dL (ref 6–20)
CO2: 20 mmol/L — ABNORMAL LOW (ref 22–32)
Calcium: 7.7 mg/dL — ABNORMAL LOW (ref 8.9–10.3)
Chloride: 114 mmol/L — ABNORMAL HIGH (ref 98–111)
Creatinine, Ser: 1.64 mg/dL — ABNORMAL HIGH (ref 0.44–1.00)
GFR calc Af Amer: 41 mL/min — ABNORMAL LOW (ref >60)
GFR calc non Af Amer: 35 mL/min — ABNORMAL LOW (ref >60)
Glucose, Bld: 156 mg/dL — ABNORMAL HIGH (ref 70–99)
Potassium: 4.1 mmol/L (ref 3.5–5.1)
Sodium: 142 mmol/L (ref 135–145)

## 2019-02-25 LAB — POCT I-STAT 7, (LYTES, BLD GAS, ICA,H+H)
Acid-base deficit: 5 mmol/L — ABNORMAL HIGH (ref 0.0–2.0)
Bicarbonate: 22.6 mmol/L (ref 20.0–28.0)
Calcium, Ion: 1.16 mmol/L (ref 1.15–1.40)
HCT: 42 % (ref 36.0–46.0)
Hemoglobin: 14.3 g/dL (ref 12.0–15.0)
O2 Saturation: 99 %
Patient temperature: 97.5
Potassium: 4.3 mmol/L (ref 3.5–5.1)
Sodium: 146 mmol/L — ABNORMAL HIGH (ref 135–145)
TCO2: 24 mmol/L (ref 22–32)
pCO2 arterial: 47.4 mmHg (ref 32.0–48.0)
pH, Arterial: 7.283 — ABNORMAL LOW (ref 7.350–7.450)
pO2, Arterial: 174 mmHg — ABNORMAL HIGH (ref 83.0–108.0)

## 2019-02-25 LAB — MRSA PCR SCREENING: MRSA by PCR: NEGATIVE

## 2019-02-25 LAB — PHOSPHORUS: Phosphorus: 2.5 mg/dL (ref 2.5–4.6)

## 2019-02-25 LAB — GLUCOSE, CAPILLARY: Glucose-Capillary: 149 mg/dL — ABNORMAL HIGH (ref 70–99)

## 2019-02-25 LAB — HIV ANTIBODY (ROUTINE TESTING W REFLEX): HIV Screen 4th Generation wRfx: NONREACTIVE

## 2019-02-25 LAB — MAGNESIUM: Magnesium: 1.8 mg/dL (ref 1.7–2.4)

## 2019-02-25 MED ORDER — POTASSIUM CHLORIDE 10 MEQ/100ML IV SOLN
10.0000 meq | INTRAVENOUS | Status: AC
  Administered 2019-02-25 (×3): 10 meq via INTRAVENOUS
  Filled 2019-02-25 (×3): qty 100

## 2019-02-25 MED ORDER — ORAL CARE MOUTH RINSE
15.0000 mL | OROMUCOSAL | Status: DC
  Administered 2019-02-25 – 2019-03-01 (×39): 15 mL via OROMUCOSAL

## 2019-02-25 MED ORDER — CHLORHEXIDINE GLUCONATE 0.12% ORAL RINSE (MEDLINE KIT)
15.0000 mL | Freq: Two times a day (BID) | OROMUCOSAL | Status: DC
  Administered 2019-02-25 – 2019-02-28 (×7): 15 mL via OROMUCOSAL

## 2019-02-25 MED ORDER — CHLORHEXIDINE GLUCONATE CLOTH 2 % EX PADS
6.0000 | MEDICATED_PAD | Freq: Every day | CUTANEOUS | Status: DC
  Administered 2019-02-26 – 2019-03-01 (×4): 6 via TOPICAL

## 2019-02-25 MED ORDER — SODIUM CHLORIDE 0.9 % IV SOLN
INTRAVENOUS | Status: DC
  Administered 2019-02-25 (×4): via INTRAVENOUS
  Administered 2019-02-26 (×2): 200 mL/h via INTRAVENOUS

## 2019-02-25 NOTE — Progress Notes (Signed)
I responded to a page from the nurse to provide spiritual support for the patient's sister and daughter. I visited the patient's room and shared words of comfort, read scriptures, and led in prayer. I shared that the Chaplain is available for additional support as needed or requested.    02/25/19 0100  Clinical Encounter Type  Visited With Patient and family together  Visit Type Spiritual support  Referral From Nurse  Consult/Referral To Chaplain  Spiritual Encounters  Spiritual Needs Prayer;Emotional  Stress Factors  Family Stress Factors Exhausted    Chaplain Dr Redgie Grayer

## 2019-02-25 NOTE — Progress Notes (Signed)
IO removed from anterior LLE per order. Vaseline gauze dressing applied, site clean, dry and intact.

## 2019-02-25 NOTE — Progress Notes (Addendum)
[facility] Progress Note Patient Name: Tracie Estrada DOB: [birth year] MRN: [id number]   Date of Service  02/25/2019  HPI/Events of Note  52/F with hx of hypertension, who comes in with intracranial bleed. Neurosurgery has seen the patient with right frontotemporal insular hematoma without IVH, small volume SAH.  Prognosis is poor.   eICU Interventions  Daughter is at the bedside and questioning whether we think she will still wake up.  I explained to her that the likelihood is very slim.  She has made the decision of DNR which is a difficult choice for anyone.    Perfusion scan is non-diagnostic. Patient may need brain death exam by 2 physicians.       Intervention Category Intermediate Interventions: Communication with other healthcare providers and/or family  Elsie Lincoln 02/25/2019, 2:06 AM    4:23 AM Notified by bedside ICU RN that pt has increased urine output, putting out 200-300cc/hr.   Plan> Start on NS@200ml /hr. Continue to monitor for now.  Follow up AM labs.

## 2019-02-25 NOTE — Progress Notes (Signed)
Updated Elink that pt is now normothermic. ABG drawn per morning labs. Asked if ground team will come to evaluate brain dead testing with apnea test now or on day shift. Awaiting response.

## 2019-02-25 NOTE — Progress Notes (Signed)
NAME:  Tracie Estrada, MRN:  [id number], DOB:  [birth year], LOS: 1 ADMISSION DATE:  03/19/2019, CONSULTATION DATE: 03/13/2019 REFERRING MD: Christena Flake ED, CHIEF COMPLAINT: Altered mental status  HPI/course in hospital  53 year old woman presented with altered mental status secondary to large intracerebral hemorrhage. Significant cerebral edema with shift but has not yet progressed to brain death.  Past Medical History   Past Medical History:  Diagnosis Date  . Acute renal failure ([place]) 10/13/2013   On Vancomycin  . Breast abscess of female    Status post biopsy  . Hypertension   . Malignant hypertension 10/13/2013     Past Surgical History:  Procedure Laterality Date  . Biopsy of left breast    . INCISION AND DRAINAGE ABSCESS Left 10/11/2013   Procedure: INCISION AND DRAINAGE and debridement LEFT BREAST ABSCESS;  Surgeon: Odis Hollingshead, MD;  Location: [facility];  Service: General;  Laterality: Left;  . LEFT HEART CATH AND CORONARY ANGIOGRAPHY N/A 08/10/2017   Procedure: LEFT HEART CATH AND CORONARY ANGIOGRAPHY;  Surgeon: Jettie Booze, MD;  Location: [facility];  Service: Cardiovascular;  Laterality: N/A;      Interim history/subjective:  Remains unresponsive.  Objective   Blood pressure 125/84, pulse 61, temperature (!) 96.2 F (35.7 C), temperature source Axillary, resp. rate 15, height 4' 9.87" (1.47 m), weight 87.8 kg, last menstrual period 11/13/2017, SpO2 100 %.    Vent Mode: PRVC FiO2 (%):  [80 %-100 %] 100 % Set Rate:  [15 bmp-18 bmp] 15 bmp Vt Set:  [400 mL] 400 mL PEEP:  [5 cmH20] 5 cmH20 Pressure Support:  [20 cmH20] 20 cmH20 Plateau Pressure:  [17 cmH20-22 cmH20] 19 cmH20   Intake/Output Summary (Last 24 hours) at 02/25/2019 1452 Last data filed at 02/25/2019 1423 Gross per 24 hour  Intake 7318.17 ml  Output 3465 ml  Net 3853.17 ml   Filed Weights   02/25/19 0145  Weight: 87.8 kg    Examination: Physical Exam Constitutional:      Appearance:  Normal appearance.  HENT:     Head: Normocephalic and atraumatic.     Mouth/Throat:     Comments: ETT and OGT in place Eyes:     Comments: Pupils 4 mm and fixed.  No doll's eyes  Cardiovascular:     Rate and Rhythm: Normal rate and regular rhythm.     Heart sounds: Normal heart sounds.  Pulmonary:     Breath sounds: Normal breath sounds.     Comments: Continues to breathe spontaneously on pressure support Abdominal:     Palpations: Abdomen is soft.  Musculoskeletal:        General: No swelling.  Skin:    General: Skin is warm and dry.     Capillary Refill: Capillary refill takes less than 2 seconds.  Neurological:     Comments: No response to painful stimuli no cough no gag      Ancillary tests (personally reviewed)  CBC: Recent Labs  Lab 03/07/2019 1945 03/14/2019 2053 02/25/19 0440 02/25/19 0447  WBC 11.5*  --  21.8*  --   NEUTROABS 5.3  --   --   --   HGB 14.4 11.9* 14.2 14.3  HCT 45.3 35.0* 42.3 42.0  MCV 93.4  --  88.3  --   PLT 252  --  226  --     Basic Metabolic Panel: Recent Labs  Lab 03/10/2019 1945 02/23/2019 1956 03/24/2019 2053 02/25/19 0440 02/25/19 0447  NA 138  --  137 142 146*  K 2.9*  --  3.1* 4.1 4.3  CL 97*  --   --  114*  --   CO2 25  --   --  20*  --   GLUCOSE 247*  --   --  156*  --   BUN 16  --   --  20  --   CREATININE 1.70* 1.60*  --  1.64*  --   CALCIUM 8.6*  --   --  7.7*  --   MG  --   --   --  1.8  --   PHOS  --   --   --  2.5  --    GFR: Estimated Creatinine Clearance: 36.8 mL/min (A) (by C-G formula based on SCr of 1.64 mg/dL (H)). Recent Labs  Lab 03/24/2019 1945 02/25/19 0440  WBC 11.5* 21.8*    Liver Function Tests: Recent Labs  Lab 02/28/2019 1945  AST 81*  ALT 66*  ALKPHOS 56  BILITOT 0.8  PROT 6.6  ALBUMIN 3.6   No results for input(s): LIPASE, AMYLASE in the last 168 hours. No results for input(s): AMMONIA in the last 168 hours.  ABG    Component Value Date/Time   PHART 7.283 (L) 02/25/2019 0447   PCO2ART  47.4 02/25/2019 0447   PO2ART 174.0 (H) 02/25/2019 0447   HCO3 22.6 02/25/2019 0447   TCO2 24 02/25/2019 0447   ACIDBASEDEF 5.0 (H) 02/25/2019 0447   O2SAT 99.0 02/25/2019 0447     Coagulation Profile: Recent Labs  Lab 03/11/2019 2108  INR 1.2    Cardiac Enzymes: Recent Labs  Lab 03/04/2019 1945  TROPONINI 0.04*    HbA1C: Hgb A1c MFr Bld  Date/Time Value Ref Range Status  07/07/2010 09:14 AM (H) <5.7 % Final   5.8 (NOTE)                                                                       According to the ADA Clinical Practice Recommendations for 2011, when HbA1c is used as a screening test:   >=6.5%   Diagnostic of Diabetes Mellitus           (if abnormal result  is confirmed)  5.7-6.4%   Increased risk of developing Diabetes Mellitus  References:Diagnosis and Classification of Diabetes Mellitus,Diabetes OVZC,5885,02(DXAJO 1):S62-S69 and Standards of Medical Care in         Diabetes - 2011,Diabetes INOM,7672,09  (Suppl 1):S11-S61.    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 If she does not progress death by neurological criteria by tomorrow morning we will approach family regarding withdrawal of care as will unlikely progress further at this time  Best practice:  Diet: On hold Pain/Anxiety/Delirium protocol (if indicated): None VAP protocol (if indicated): Bundle in place DVT prophylaxis: Mechanical prophylaxis only due to bleed GI prophylaxis: Pantoprazole Urinary catheter: End-of-life care Glucose control: Monitoring only Mobility: Bedrest Code Status: DNR Family Communication: daughter updated Disposition:  will  discuss withdrawal of care tomorrow if doesn't meet by criteria for neurological death.   Critical care time: 40 min including chart data review, examination of patient, multidisciplinary rounds, and frequent assessment and repeated neurological evaluation and discussion of goals of care with family.     Kipp Brood, MD [facility] ICU Physician [place]  Pager: [phone] Mobile: [phone] After hours: [phone].  02/25/2019, 2:52 PM

## 2019-02-25 NOTE — Progress Notes (Signed)
Initial Nutrition Assessment  DOCUMENTATION CODES:   Morbid obesity  INTERVENTION:   If aggressive care is pursued, recommend obtaining enteral access and initiating enteral nutrition: - Osmolite 1.2 @ 20 ml/hr (480 ml/day) - Pro-stat 60 ml TID  Tube feeding regimen provides 1176 kcal, 117 grams of protein, and 394 ml of H2O (100% of needs).  NUTRITION DIAGNOSIS:   Inadequate oral intake related to inability to eat as evidenced by NPO status.  GOAL:   Provide needs based on ASPEN/SCCM guidelines  MONITOR:   Vent status, Labs, Weight trends, I & O's, Other (GOC)  REASON FOR ASSESSMENT:   Ventilator    ASSESSMENT:   53 year old female who presented to the ED on 4/2 after collapsing at home. Pt cardiac arrested during transport and was intubated. Code stroke initiated and CT revealed large right frontal temporal/insular ICH. PMH of tobacco use, HTN, HLD.  Pt unresponsive off sedation. Prognosis poor. Will leave TF recommendations if aggressive intervention is pursued. If so, pt will require enteral access as no OG tube is currently in place.  Patient is currently intubated on ventilator support MV: 6 L/min Temp (24hrs), Avg:95.1 F (35.1 C), Min:92 F (33.3 C), Max:98.3 F (36.8 C) BP: 125/84 MAP: 86  Propofol: none NS: 200 ml/hr LR: 75 ml/hr  Medications reviewed and include: Protonix, IV abx  Labs reviewed: sodium 146 (H), creatinine 1.64 (H) CBG's: 149, 107 K+, mag, phos all WNL  UOP: 2465 ml x 24 hours I/O's: +3.6 L since admit  NUTRITION - FOCUSED PHYSICAL EXAM:  Deferred at this time.  Diet Order:   Diet Order            Diet NPO time specified  Diet effective now              EDUCATION NEEDS:   Not appropriate for education at this time  Skin:  Skin Assessment: Reviewed RN Assessment  Last BM:  4/3 smear type 7  Height:   Ht Readings from Last 1 Encounters:  02/25/19 4' 9.87" (1.47 m)    Weight:   Wt Readings from Last 1  Encounters:  02/25/19 87.8 kg    Ideal Body Weight:  40.9 kg  BMI:  Body mass index is 40.63 kg/m.  Estimated Nutritional Needs:   Kcal:  384-5364  Protein:  102-115 grams   Fluid:  per MD    Gaynell Face, MS, RD, LDN Inpatient Clinical Dietitian Pager: [phone] Weekend/After Hours: [phone]

## 2019-02-25 NOTE — Progress Notes (Signed)
Spoke with CDS regarding pt candidate for organ donation. For DCD death, pt is not a candidate. Will reevaluate if brain death testing is performed.

## 2019-02-25 NOTE — Progress Notes (Addendum)
Fi02 increased to 100% per CCM MD.

## 2019-02-25 NOTE — Progress Notes (Addendum)
Per Dr. Leonel Ramsay keep SBP <160.   Pt dumping urine, total of 1700 in 3 hours. Concerned for pt going into DI. Notified Neurology and CCM. Pt received 4L of NS in ED, and is receiving LR @75 . Awaiting guidance from [facility] MD.

## 2019-02-25 NOTE — Progress Notes (Addendum)
Dr. Claudie Leach at bedside discussing pt condition with sister Allice Garro). Discussed current outlook of patient and that we will do brain dead testing on patient when she reaches normothermia. MD changed FiO2 on vent to 100% to prepare for testing.

## 2019-02-25 NOTE — Progress Notes (Signed)
STROKE TEAM PROGRESS NOTE   INTERVAL HISTORY Patient presented with significantly elevated blood pressure with a large intracerebral hemorrhage with cytotoxic edema and midline shift and brain herniation. Her neurological exam remains quite poor with a near brain death exam. Blood pressure is adequately controlled.  Vitals:   02/25/19 0700 02/25/19 0800 02/25/19 0804 02/25/19 0805  BP: 108/70 106/70    Pulse: 74 73    Resp: 15 15    Temp:   98.3 F (36.8 C)   TempSrc:   Oral   SpO2: 100% 100%  100%  Weight:      Height:        CBC:  Recent Labs  Lab 03/08/2019 1945  02/25/19 0440 02/25/19 0447  WBC 11.5*  --  21.8*  --   NEUTROABS 5.3  --   --   --   HGB 14.4   < > 14.2 14.3  HCT 45.3   < > 42.3 42.0  MCV 93.4  --  88.3  --   PLT 252  --  226  --    < > = values in this interval not displayed.    Basic Metabolic Panel:  Recent Labs  Lab 03/08/2019 1945 03/24/2019 1956  02/25/19 0440 02/25/19 0447  NA 138  --    < > 142 146*  K 2.9*  --    < > 4.1 4.3  CL 97*  --   --  114*  --   CO2 25  --   --  20*  --   GLUCOSE 247*  --   --  156*  --   BUN 16  --   --  20  --   CREATININE 1.70* 1.60*  --  1.64*  --   CALCIUM 8.6*  --   --  7.7*  --   MG  --   --   --  1.8  --   PHOS  --   --   --  2.5  --    < > = values in this interval not displayed.   Lipid Panel:     Component Value Date/Time   CHOL 185 10/25/2013 1051   TRIG 153 (H) 10/25/2013 1051   HDL 36 (L) 10/25/2013 1051   CHOLHDL 5.1 10/25/2013 1051   VLDL 31 10/25/2013 1051   LDLCALC 118 (H) 10/25/2013 1051   HgbA1c:  Lab Results  Component Value Date   HGBA1C (H) 07/07/2010    5.8 (NOTE)                                                                       According to the ADA Clinical Practice Recommendations for 2011, when HbA1c is used as a screening test:   >=6.5%   Diagnostic of Diabetes Mellitus           (if abnormal result  is confirmed)  5.7-6.4%   Increased risk of developing Diabetes Mellitus   References:Diagnosis and Classification of Diabetes Mellitus,Diabetes TMHD,6222,97(LGXQJ 1):S62-S69 and Standards of Medical Care in         Diabetes - 2011,Diabetes JHER,7408,14  (Suppl 1):S11-S61.   Urine Drug Screen:     Component Value Date/Time   LABOPIA NONE DETECTED 02/23/2019 2356  COCAINSCRNUR NONE DETECTED 02/25/2019 2356   LABBENZ NONE DETECTED 03/08/2019 2356   AMPHETMU NONE DETECTED 03/21/2019 2356   THCU NONE DETECTED 03/23/2019 2356   LABBARB NONE DETECTED 02/23/2019 2356    Alcohol Level     Component Value Date/Time   ETH <10 03/04/2019 1945    IMAGING Ct Angio Head W Or Wo Contrast  Result Date: 03/03/2019 CLINICAL DATA:  Follow up intracranial hemorrhage. History of hypertension. EXAM: CT ANGIOGRAPHY HEAD AND NECK TECHNIQUE: Multidetector CT imaging of the head and neck was performed using the standard protocol during bolus administration of intravenous contrast. Multiplanar CT image reconstructions and MIPs were obtained to evaluate the vascular anatomy. Carotid stenosis measurements (when applicable) are obtained utilizing NASCET criteria, using the distal internal carotid diameter as the denominator. COMPARISON:  CT HEAD February 24, 2019 and CT neck March 14, 2018 FINDINGS: Examination was performed 3 times as contrast was not well demonstrated intracranially. CTA NECK FINDINGS: AORTIC ARCH: Normal appearance of the thoracic arch, normal branch pattern. Mild calcific atherosclerosis aortic arch. The origins of the innominate, left Common carotid artery and subclavian artery are patent. RIGHT CAROTID SYSTEM: Common carotid artery is patent, mild luminal irregularity compatible with atherosclerosis. Normal appearance of the carotid bifurcation without hemodynamically significant stenosis by NASCET criteria. Gradual loss of contrast opacification internal cerebral artery. LEFT CAROTID SYSTEM: Common carotid artery is patent, mild luminal irregularity compatible with  atherosclerosis. Normal appearance of the carotid bifurcation without hemodynamically significant stenosis by NASCET criteria. Gradual loss of contrast internal cerebral artery. VERTEBRAL ARTERIES:RIGHT vertebral artery is dominant. Gradual loss of bilateral vertebral arteries contrast opacification at V2 segment. SKELETON: See below.  Small LEFT trapezius intramuscular lipoma. OTHER NECK: Soft tissues of the neck are nonacute though, not tailored for evaluation. UPPER CHEST: Heart is enlarged, incompletely assessed. Dependent consolidation with pulmonary vascular congestion most compatible with pulmonary edema. Partially imaged suspected RIGHT hilar lymphadenopathy. Endotracheal tube tip immediately above the carina. CTA HEAD FINDINGS: Limited assessment due to poor intracranial contrast. ANTERIOR CIRCULATION: Bilateral internal cerebral arteries are patent. Narrowed and irregular anterior middle cerebral arteries old thus appearance of the RIGHT MCA bifurcation with into cutoff. In addition, bulbous A2 3 junction measuring to 2 mm. POSTERIOR CIRCULATION: No discernible contrast within the posterior circulation. VENOUS SINUSES: Contrast within the superior sagittal sinus and to lesser extent transverse sinus, likely narrowed due to intracranial hypertension. ANATOMIC VARIANTS: None. DELAYED PHASE: No abnormal intracranial enhancement though limited by presence of acute blood products. MIP images reviewed. CT CERVICAL SPINE: ALIGNMENT: Straightened lordosis.  No malalignment. SKULL BASE AND VERTEBRAE: Cervical vertebral bodies and posterior elements are intact. Severe C3-4 disc height loss with endplate sclerosis and marginal spurring compatible with degenerative discs. Moderate to severe C4-5 and moderate C6-7 degenerative discs. C1-2 articulation maintained. No destructive bony lesions. SOFT TISSUES AND SPINAL CANAL: See above. DISC LEVELS: Moderate canal stenosis C3-4. No high-grade neural foraminal narrowing.  UPPER CHEST: Lung apices are clear. OTHER: None. IMPRESSION: CTA NECK: 1. No hemodynamically significant stenosis ICA's. 2. Patent proximal vertebral arteries. 3. Cardiomegaly and findings of pulmonary edema. CTA HEAD: 1. Poor intravascular contrast attributable to poor cardiac output and increased intracranial pressure, minimally technically improved after 3 attempts. No contrast opacification of posterior circulation. 2. Patent ICA, anterior middle cerebral arteries with attenuated appearance attributable to and intracranial hypertension, possible component of vasospasm. 3. 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 CT CERVICAL SPINE: 1. No fracture or  malalignment. 2. Moderate canal stenosis C3-4. Critical Value/emergent results were called by telephone at the time of interpretation on 03/19/2019 at 8:30 pm to Dr. Leonel Ramsay, Neurology, who verbally acknowledged these results. Aortic Atherosclerosis (ICD10-I70.0). Electronically Signed   By: Elon Alas M.D.   On: 03/18/2019 20:59   Ct Angio Neck W And/or Wo Contrast  Result Date: 03/09/2019 CLINICAL DATA:  Follow up intracranial hemorrhage. History of hypertension. EXAM: CT ANGIOGRAPHY HEAD AND NECK TECHNIQUE: Multidetector CT imaging of the head and neck was performed using the standard protocol during bolus administration of intravenous contrast. Multiplanar CT image reconstructions and MIPs were obtained to evaluate the vascular anatomy. Carotid stenosis measurements (when applicable) are obtained utilizing NASCET criteria, using the distal internal carotid diameter as the denominator. COMPARISON:  CT HEAD February 24, 2019 and CT neck March 14, 2018 FINDINGS: Examination was performed 3 times as contrast was not well demonstrated intracranially. CTA NECK FINDINGS: AORTIC ARCH: Normal appearance of the thoracic arch, normal branch pattern. Mild calcific atherosclerosis aortic arch.  The origins of the innominate, left Common carotid artery and subclavian artery are patent. RIGHT CAROTID SYSTEM: Common carotid artery is patent, mild luminal irregularity compatible with atherosclerosis. Normal appearance of the carotid bifurcation without hemodynamically significant stenosis by NASCET criteria. Gradual loss of contrast opacification internal cerebral artery. LEFT CAROTID SYSTEM: Common carotid artery is patent, mild luminal irregularity compatible with atherosclerosis. Normal appearance of the carotid bifurcation without hemodynamically significant stenosis by NASCET criteria. Gradual loss of contrast internal cerebral artery. VERTEBRAL ARTERIES:RIGHT vertebral artery is dominant. Gradual loss of bilateral vertebral arteries contrast opacification at V2 segment. SKELETON: See below.  Small LEFT trapezius intramuscular lipoma. OTHER NECK: Soft tissues of the neck are nonacute though, not tailored for evaluation. UPPER CHEST: Heart is enlarged, incompletely assessed. Dependent consolidation with pulmonary vascular congestion most compatible with pulmonary edema. Partially imaged suspected RIGHT hilar lymphadenopathy. Endotracheal tube tip immediately above the carina. CTA HEAD FINDINGS: Limited assessment due to poor intracranial contrast. ANTERIOR CIRCULATION: Bilateral internal cerebral arteries are patent. Narrowed and irregular anterior middle cerebral arteries old thus appearance of the RIGHT MCA bifurcation with into cutoff. In addition, bulbous A2 3 junction measuring to 2 mm. POSTERIOR CIRCULATION: No discernible contrast within the posterior circulation. VENOUS SINUSES: Contrast within the superior sagittal sinus and to lesser extent transverse sinus, likely narrowed due to intracranial hypertension. ANATOMIC VARIANTS: None. DELAYED PHASE: No abnormal intracranial enhancement though limited by presence of acute blood products. MIP images reviewed. CT CERVICAL SPINE: ALIGNMENT: Straightened  lordosis.  No malalignment. SKULL BASE AND VERTEBRAE: Cervical vertebral bodies and posterior elements are intact. Severe C3-4 disc height loss with endplate sclerosis and marginal spurring compatible with degenerative discs. Moderate to severe C4-5 and moderate C6-7 degenerative discs. C1-2 articulation maintained. No destructive bony lesions. SOFT TISSUES AND SPINAL CANAL: See above. DISC LEVELS: Moderate canal stenosis C3-4. No high-grade neural foraminal narrowing. UPPER CHEST: Lung apices are clear. OTHER: None. IMPRESSION: CTA NECK: 1. No hemodynamically significant stenosis ICA's. 2. Patent proximal vertebral arteries. 3. Cardiomegaly and findings of pulmonary edema. CTA HEAD: 1. Poor intravascular contrast attributable to poor cardiac output and increased intracranial pressure, minimally technically improved after 3 attempts. No contrast opacification of posterior circulation. 2. Patent ICA, anterior middle cerebral arteries with attenuated appearance attributable to and intracranial hypertension, possible component of vasospasm. 3. 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 CT CERVICAL SPINE: 1. No fracture or malalignment. 2. Moderate canal  stenosis C3-4. Critical Value/emergent results were called by telephone at the time of interpretation on 03/12/2019 at 8:30 pm to Dr. Leonel Ramsay, Neurology, who verbally acknowledged these results. Aortic Atherosclerosis (ICD10-I70.0). Electronically Signed   By: Elon Alas M.D.   On: 03/11/2019 20:59   Ct C-spine No Charge  Result Date: 03/07/2019 CLINICAL DATA:  Follow up intracranial hemorrhage. History of hypertension. EXAM: CT ANGIOGRAPHY HEAD AND NECK TECHNIQUE: Multidetector CT imaging of the head and neck was performed using the standard protocol during bolus administration of intravenous contrast. Multiplanar CT image reconstructions and MIPs were obtained to evaluate the  vascular anatomy. Carotid stenosis measurements (when applicable) are obtained utilizing NASCET criteria, using the distal internal carotid diameter as the denominator. COMPARISON:  CT HEAD February 24, 2019 and CT neck March 14, 2018 FINDINGS: Examination was performed 3 times as contrast was not well demonstrated intracranially. CTA NECK FINDINGS: AORTIC ARCH: Normal appearance of the thoracic arch, normal branch pattern. Mild calcific atherosclerosis aortic arch. The origins of the innominate, left Common carotid artery and subclavian artery are patent. RIGHT CAROTID SYSTEM: Common carotid artery is patent, mild luminal irregularity compatible with atherosclerosis. Normal appearance of the carotid bifurcation without hemodynamically significant stenosis by NASCET criteria. Gradual loss of contrast opacification internal cerebral artery. LEFT CAROTID SYSTEM: Common carotid artery is patent, mild luminal irregularity compatible with atherosclerosis. Normal appearance of the carotid bifurcation without hemodynamically significant stenosis by NASCET criteria. Gradual loss of contrast internal cerebral artery. VERTEBRAL ARTERIES:RIGHT vertebral artery is dominant. Gradual loss of bilateral vertebral arteries contrast opacification at V2 segment. SKELETON: See below.  Small LEFT trapezius intramuscular lipoma. OTHER NECK: Soft tissues of the neck are nonacute though, not tailored for evaluation. UPPER CHEST: Heart is enlarged, incompletely assessed. Dependent consolidation with pulmonary vascular congestion most compatible with pulmonary edema. Partially imaged suspected RIGHT hilar lymphadenopathy. Endotracheal tube tip immediately above the carina. CTA HEAD FINDINGS: Limited assessment due to poor intracranial contrast. ANTERIOR CIRCULATION: Bilateral internal cerebral arteries are patent. Narrowed and irregular anterior middle cerebral arteries old thus appearance of the RIGHT MCA bifurcation with into cutoff. In  addition, bulbous A2 3 junction measuring to 2 mm. POSTERIOR CIRCULATION: No discernible contrast within the posterior circulation. VENOUS SINUSES: Contrast within the superior sagittal sinus and to lesser extent transverse sinus, likely narrowed due to intracranial hypertension. ANATOMIC VARIANTS: None. DELAYED PHASE: No abnormal intracranial enhancement though limited by presence of acute blood products. MIP images reviewed. CT CERVICAL SPINE: ALIGNMENT: Straightened lordosis.  No malalignment. SKULL BASE AND VERTEBRAE: Cervical vertebral bodies and posterior elements are intact. Severe C3-4 disc height loss with endplate sclerosis and marginal spurring compatible with degenerative discs. Moderate to severe C4-5 and moderate C6-7 degenerative discs. C1-2 articulation maintained. No destructive bony lesions. SOFT TISSUES AND SPINAL CANAL: See above. DISC LEVELS: Moderate canal stenosis C3-4. No high-grade neural foraminal narrowing. UPPER CHEST: Lung apices are clear. OTHER: None. IMPRESSION: CTA NECK: 1. No hemodynamically significant stenosis ICA's. 2. Patent proximal vertebral arteries. 3. Cardiomegaly and findings of pulmonary edema. CTA HEAD: 1. Poor intravascular contrast attributable to poor cardiac output and increased intracranial pressure, minimally technically improved after 3 attempts. No contrast opacification of posterior circulation. 2. Patent ICA, anterior middle cerebral arteries with attenuated appearance attributable to and intracranial hypertension, possible component of vasospasm. 3. 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 CT CERVICAL SPINE: 1. No fracture or malalignment. 2. Moderate canal stenosis C3-4. Critical Value/emergent results were called  by telephone at the time of interpretation on 03/07/2019 at 8:30 pm to Dr. Leonel Ramsay, Neurology, who verbally acknowledged these results. Aortic Atherosclerosis  (ICD10-I70.0). Electronically Signed   By: Elon Alas M.D.   On: 03/17/2019 20:59   Ct Cerebral Perfusion W Contrast  Result Date: 03/24/2019 CLINICAL DATA:  Follow up intracranial hemorrhage. History of hypertension. EXAM: CT PERFUSION BRAIN TECHNIQUE: Multiphase CT imaging of the brain was performed following IV bolus contrast injection. Subsequent parametric perfusion maps were calculated using RAPID software. CONTRAST:  40 cc Omnipaque 350 COMPARISON:  CT HEAD February 24, 2019 FINDINGS: Nondiagnostic examination due to lack of consistent intravascular contrast opacification. Increasingly consolidated rounded contrast at RIGHT MCA bifurcation measuring to 10 mm equivocal for aneurysm, may reflect consolidated extra-axial blood products. IMPRESSION: 1. Nondiagnostic CT perfusion brain. Electronically Signed   By: Elon Alas M.D.   On: 03/23/2019 23:23   Dg Chest Port 1 View  Result Date: 03/11/2019 CLINICAL DATA:  Initial evaluation for intubation. EXAM: PORTABLE CHEST 1 VIEW COMPARISON:  Prior radiograph from 05/05/2018. FINDINGS: Endotracheal tube in place with tip positioned 1.9 cm above the carina. Moderate cardiomegaly, stable. Mediastinal silhouette within normal limits. Lungs hypoinflated. Mild diffuse pulmonary vascular congestion without overt pulmonary edema. No focal infiltrates. No pleural effusion. No pneumothorax. No acute osseous finding. IMPRESSION: 1. Tip of the endotracheal tube positioned 1.9 cm above the carina. 2. Cardiomegaly with mild diffuse pulmonary vascular congestion without overt pulmonary edema. Electronically Signed   By: Jeannine Boga M.D.   On: 03/16/2019 20:14   Ct Head Code Stroke Wo Contrast  Result Date: 03/09/2019 CLINICAL DATA:  Code stroke.  RIGHT-sided weakness, facial droop. EXAM: CT HEAD WITHOUT CONTRAST TECHNIQUE: Contiguous axial images were obtained from the base of the skull through the vertex without intravenous contrast. COMPARISON:  CT  HEAD September 30, 2010 FINDINGS: BRAIN: 4.1 x 4.5 x 5.8 cm (volume = 56 cc) ( RIGHT frontotemporal/insular hematoma with surrounding low-density vasogenic edema. 7 mm RIGHT to LEFT midline shift. Effaced RIGHT lateral ventricle without intraventricular extension. Mild global parenchymal edema with sulcal effacement. Small volume RIGHT subarachnoid hemorrhage. Basal cisterns effaced. VASCULAR: Unremarkable. SKULL/SOFT TISSUES: No skull fracture. Old bilateral nasal bone fractures. Expanded empty sella. No significant soft tissue swelling. ORBITS/SINUSES: Old RIGHT medial orbital blowout fracture. Moderate paranasal sinus mucosal thickening. Mastoid air cells are well aerated. OTHER: None. ASPECTS [facility] Stroke Program Early CT Score) -not applicable. IMPRESSION: 1. Acute 56 cc frontotemporal/insular acute hemorrhage. 7 mm RIGHT to LEFT midline shift. Global edema. 2. Small volume RIGHT subarachnoid hemorrhage. 3. Critical Value/emergent results were called by telephone at the time of interpretation on 03/24/2019 at 8:13 pm to Dr. Shirlyn Goltz , who verbally acknowledged these results. 4. Critical Value/emergent results text paged to [place], Neurology via [employer] secure system on 03/03/2019 at 8:10 pm, including interpreting physician's phone number. Electronically Signed   By: Elon Alas M.D.   On: 03/07/2019 20:14    PHYSICAL EXAM Middle-aged obese African-American lady who was intubated and unresponsive. . Afebrile. Head is nontraumatic. Neck is supple without bruit.    Cardiac exam no murmur or gallop. Lungs are clear to auscultation. Distal pulses are well felt. Neurological Exam :   unresponsive. Intubated. Not on sedation. Eyes are closed. Eyes are in primary position. Pupils 3 mm fixed and nonreactive. Corneal reflexes are absent bilaterally. Doll's eye reflexes are absent. No respiratory effort abovel ventilatory setting.Fundi were not visualized. Cough and gag are absent. Tongue is midline. No  spontaneous extremity movements. No response to sternal rub in extremities. Trace withdrawal in lower extremity strength noxious stimuli only. The tendon reflexes are absent. Plantars are both mute.  ASSESSMENT/PLAN  [name] is a 53 y.o. female with history of malignant HTN found unresponsive with emesis and L sided weakness who developed PEA arrest enroute.    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 Hmg secondary to HTN  Code Stroke CT head 56cc frontotemporal/insular hmg. 30mm R to L shift. Global edema.  CTA head poor contrast d/t poor CO. Patent ICA. R MCA bifurcation w/o discrete aneurysm. 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   CTA neck no sign stenosis. CM and pulm edema.   CT CS no fx, mod C3-4 canal stenosis  CT perfusion nondiagnostic  SCDs for VTE prophylaxis  aspirin 81 mg daily prior to admission, now on No antithrombotic given hemorrhage.   DNR  Hemorrhage is not survivable   Acute hypoxic respiratory failure R/o aspiration  Intubated. On vent  CCM onboard  Cardiac arrest Elevated troponin  Hypotension Hypertensive Emergency  BP 240/102 at time on EMS arrival, as high as 234/187  Stable . SBP goal < 160  Hyperlipidemia  Home meds:  lipitor 40  Hyperglycemia  No hx diabetes  Other Stroke Risk Factors  Cigarette smoker  Morbid Obesity, Body mass index is 40.63 kg/m.   Other Active Problems  Hypokalemia 3.1  AI Cr 1.64  Leukocytosis 21.8  Hospital day # 1  I have personally obtained history,examined this patient, reviewed notes, independently viewed imaging studies, participated in medical decision making and plan of care.ROS completed by me personally and pertinent positives fully documented  I have made any additions or clarifications directly to the above note. She presented with unresponsiveness secondary to hypertensive intracerebral hemorrhage with significant cytotoxic  edema midline shift and brain herniation with a neurological exam which is quite poor consistent with brain death. Prognosis of survival and making any meaningful improvement is negligible. Agree with critical care M.D. plan to do brain death exam by doing apnea test. If patient is hemodynamically unstable to tolerate a practice recommend doing transcranial Doppler study to confirm brain death. No family available for discussion.Discussed with Dr. Lynetta Mare.. This patient is critically ill and at significant risk of neurological worsening, death and care requires constant monitoring of vital signs, hemodynamics,respiratory and cardiac monitoring, extensive review of multiple databases, frequent neurological assessment, discussion with family, other specialists and medical decision making of high complexity.I have made any additions or clarifications directly to the above note.This critical care time does not reflect procedure time, or teaching time or supervisory time of PA/NP/Med Resident etc but could involve care discussion time.  I spent 30 minutes of neurocritical care time  in the care of  this patient.      Antony Contras, MD Medical Director [place] Pager: [phone] 02/25/2019 3:59 PM   To contact Stroke Continuity provider, please refer to [email or web address] After hours, contact General Neurology

## 2019-02-25 NOTE — Plan of Care (Signed)
  Problem: Education: Goal: Knowledge of General Education information will improve Description Including pain rating scale, medication(s)/side effects and non-pharmacologic comfort measures Outcome: Not Progressing   Problem: Health Behavior/Discharge Planning: Goal: Ability to manage health-related needs will improve 02/25/2019 0146 by Monna Fam, RN Outcome: Not Progressing 02/25/2019 0145 by Monna Fam, RN Outcome: Not Progressing   Problem: Clinical Measurements: Goal: Ability to maintain clinical measurements within normal limits will improve 02/25/2019 0146 by Monna Fam, RN Outcome: Progressing 02/25/2019 0145 by Monna Fam, RN Outcome: Not Progressing Goal: Will remain free from infection 02/25/2019 0146 by Monna Fam, RN Outcome: Progressing 02/25/2019 0145 by Monna Fam, RN Outcome: Not Progressing Goal: Diagnostic test results will improve 02/25/2019 0146 by Monna Fam, RN Outcome: Not Progressing 02/25/2019 0145 by Monna Fam, RN Outcome: Not Progressing Goal: Respiratory complications will improve Outcome: Not Progressing Goal: Cardiovascular complication will be avoided Outcome: Not Progressing

## 2019-02-26 DIAGNOSIS — J9601 Acute respiratory failure with hypoxia: Secondary | ICD-10-CM

## 2019-02-26 DIAGNOSIS — G936 Cerebral edema: Secondary | ICD-10-CM

## 2019-02-26 DIAGNOSIS — G935 Compression of brain: Secondary | ICD-10-CM

## 2019-02-26 MED ORDER — SODIUM CHLORIDE 0.9 % IV SOLN
INTRAVENOUS | Status: DC | PRN
  Administered 2019-03-01: 250 mL via INTRAVENOUS

## 2019-02-26 NOTE — Progress Notes (Signed)
NAME:  Tracie Estrada, MRN:  [id number], DOB:  1965-11-26, LOS: 2 ADMISSION DATE:  03/02/2019, CONSULTATION DATE: 02/26/2019 REFERRING MD: [facility], CHIEF COMPLAINT: Altered mental status  HPI/course in hospital  53 year old woman presented with altered mental status secondary to large intracerebral hemorrhage. Significant cerebral edema with shift but has not yet progressed to brain death.  Past Medical History   Past Medical History:  Diagnosis Date  . Acute renal failure ([place]) 10/13/2013   On Vancomycin  . Breast abscess of female    Status post biopsy  . Hypertension   . Malignant hypertension 10/13/2013     Past Surgical History:  Procedure Laterality Date  . Biopsy of left breast    . INCISION AND DRAINAGE ABSCESS Left 10/11/2013   Procedure: INCISION AND DRAINAGE and debridement LEFT BREAST ABSCESS;  Surgeon: Odis Hollingshead, MD;  Location: [facility];  Service: General;  Laterality: Left;  . LEFT HEART CATH AND CORONARY ANGIOGRAPHY N/A 08/10/2017   Procedure: LEFT HEART CATH AND CORONARY ANGIOGRAPHY;  Surgeon: Jettie Booze, MD;  Location: [facility];  Service: Cardiovascular;  Laterality: N/A;      Interim history/subjective:  Remains unresponsive.  Objective   Blood pressure 120/70, pulse 90, temperature (!) 100.8 F (38.2 C), resp. rate 15, height 4' 9.87" (1.47 m), weight 93.1 kg, last menstrual period 11/13/2017, SpO2 99 %.    Vent Mode: PRVC FiO2 (%):  [50 %] 50 % Set Rate:  [15 bmp] 15 bmp Vt Set:  [400 mL] 400 mL PEEP:  [5 cmH20] 5 cmH20 Plateau Pressure:  [19 cmH20-20 cmH20] 20 cmH20   Intake/Output Summary (Last 24 hours) at 02/26/2019 1438 Last data filed at 02/26/2019 1406 Gross per 24 hour  Intake 5398.22 ml  Output 2175 ml  Net 3223.22 ml   Filed Weights   02/25/19 0145 02/26/19 0500  Weight: 87.8 kg 93.1 kg    Examination: Physical Exam Constitutional:      Appearance: Normal appearance.  HENT:     Head: Normocephalic and  atraumatic.     Mouth/Throat:     Comments: ETT and OGT in place Eyes:     Comments: Pupils 4 mm and fixed.  No doll's eyes  Cardiovascular:     Rate and Rhythm: Normal rate and regular rhythm.     Heart sounds: Normal heart sounds.  Pulmonary:     Breath sounds: Normal breath sounds.     Comments: Continues to breathe spontaneously on pressure support Abdominal:     Palpations: Abdomen is soft.  Musculoskeletal:        General: No swelling.  Skin:    General: Skin is warm and dry.     Capillary Refill: Capillary refill takes less than 2 seconds.  Neurological:     Comments: No response to painful stimuli no cough no gag      Ancillary tests (personally reviewed)  CBC: Recent Labs  Lab 03/11/2019 1945 02/23/2019 2053 02/25/19 0440 02/25/19 0447  WBC 11.5*  --  21.8*  --   NEUTROABS 5.3  --   --   --   HGB 14.4 11.9* 14.2 14.3  HCT 45.3 35.0* 42.3 42.0  MCV 93.4  --  88.3  --   PLT 252  --  226  --     Basic Metabolic Panel: Recent Labs  Lab 03/24/2019 1945 03/05/2019 1956 03/18/2019 2053 02/25/19 0440 02/25/19 0447  NA 138  --  137 142 146*  K 2.9*  --  3.1* 4.1 4.3  CL 97*  --   --  114*  --   CO2 25  --   --  20*  --   GLUCOSE 247*  --   --  156*  --   BUN 16  --   --  20  --   CREATININE 1.70* 1.60*  --  1.64*  --   CALCIUM 8.6*  --   --  7.7*  --   MG  --   --   --  1.8  --   PHOS  --   --   --  2.5  --    GFR: Estimated Creatinine Clearance: 38.1 mL/min (A) (by C-G formula based on SCr of 1.64 mg/dL (H)). Recent Labs  Lab 03/16/2019 1945 02/25/19 0440  WBC 11.5* 21.8*    Liver Function Tests: Recent Labs  Lab 02/25/2019 1945  AST 81*  ALT 66*  ALKPHOS 56  BILITOT 0.8  PROT 6.6  ALBUMIN 3.6   No results for input(s): LIPASE, AMYLASE in the last 168 hours. No results for input(s): AMMONIA in the last 168 hours.  ABG    Component Value Date/Time   PHART 7.283 (L) 02/25/2019 0447   PCO2ART 47.4 02/25/2019 0447   PO2ART 174.0 (H) 02/25/2019 0447    HCO3 22.6 02/25/2019 0447   TCO2 24 02/25/2019 0447   ACIDBASEDEF 5.0 (H) 02/25/2019 0447   O2SAT 99.0 02/25/2019 0447     Coagulation Profile: Recent Labs  Lab 02/26/2019 2108  INR 1.2    Cardiac Enzymes: Recent Labs  Lab 02/25/2019 1945  TROPONINI 0.04*    HbA1C: Hgb A1c MFr Bld  Date/Time Value Ref Range Status  07/07/2010 09:14 AM (H) <5.7 % Final   5.8 (NOTE)                                                                       According to the ADA Clinical Practice Recommendations for 2011, when HbA1c is used as a screening test:   >=6.5%   Diagnostic of Diabetes Mellitus           (if abnormal result  is confirmed)  5.7-6.4%   Increased risk of developing Diabetes Mellitus  References:Diagnosis and Classification of Diabetes Mellitus,Diabetes RSWN,4627,03(JKKXF 1):S62-S69 and Standards of Medical Care in         Diabetes - 2011,Diabetes GHWE,9937,16  (Suppl 1):S11-S61.    CBG: Recent Labs  Lab 03/07/2019 2325 02/25/19 0352  GLUCAP 107* 149*     Assessment & Plan:  Critically ill due to respiratory failure require mechanical ventilation Coma secondary to intraparenchymal cerebral hemorrhage with mass-effect and cerebral herniation She has not progressed to brain-death after 48h and is not likely to do so ICH score of 4 with expected mortality of 97%. Hypertension.  Plan:  Continue ventilatory support Serial neurological examination to assess for progression to brain death. Have discussed poor prognosis and advised family to come in anticipation of withdrawal of care.  Best practice:  Diet: On hold Pain/Anxiety/Delirium protocol (if indicated): None VAP protocol (if indicated): Bundle in place DVT prophylaxis: Mechanical prophylaxis only due to bleed GI prophylaxis: Pantoprazole Urinary catheter: End-of-life care Glucose control: Monitoring only Mobility: Bedrest Code Status: DNR Family Communication: daughter  updated Disposition: will discuss  withdrawal of care tomorrow if doesn't meet by criteria for neurological death.   Critical care time: 35 min including chart data review, examination of patient, multidisciplinary rounds, and frequent assessment and repeated neurological evaluation and discussion of goals of care with family.     Kipp Brood, MD [facility] ICU Physician [place]  Pager: [phone] Mobile: [phone] After hours: [phone].  02/26/2019, 2:38 PM

## 2019-02-26 NOTE — Progress Notes (Signed)
STROKE TEAM PROGRESS NOTE   INTERVAL HISTORY  Her neurological exam remains quite poor with a near brain death exam. Blood pressure is adequately controlled.  No improvement in exam today.  Family meeting scheduled later today per nurse and extubation may be considered.  Vitals:   02/26/19 1000 02/26/19 1100 02/26/19 1118 02/26/19 1200  BP: 120/67 120/68 125/61 118/66  Pulse: 85 86  90  Resp: 15 15  14   Temp: 99.5 F (37.5 C) 100 F (37.8 C)  (!) 100.8 F (38.2 C)  TempSrc:    Esophageal  SpO2: 99% 99% 99% 99%  Weight:      Height:        CBC:  Recent Labs  Lab 03/17/2019 1945  02/25/19 0440 02/25/19 0447  WBC 11.5*  --  21.8*  --   NEUTROABS 5.3  --   --   --   HGB 14.4   < > 14.2 14.3  HCT 45.3   < > 42.3 42.0  MCV 93.4  --  88.3  --   PLT 252  --  226  --    < > = values in this interval not displayed.    Basic Metabolic Panel:  Recent Labs  Lab 03/19/2019 1945 02/28/2019 1956  02/25/19 0440 02/25/19 0447  NA 138  --    < > 142 146*  K 2.9*  --    < > 4.1 4.3  CL 97*  --   --  114*  --   CO2 25  --   --  20*  --   GLUCOSE 247*  --   --  156*  --   BUN 16  --   --  20  --   CREATININE 1.70* 1.60*  --  1.64*  --   CALCIUM 8.6*  --   --  7.7*  --   MG  --   --   --  1.8  --   PHOS  --   --   --  2.5  --    < > = values in this interval not displayed.   Lipid Panel:     Component Value Date/Time   CHOL 185 10/25/2013 1051   TRIG 153 (H) 10/25/2013 1051   HDL 36 (L) 10/25/2013 1051   CHOLHDL 5.1 10/25/2013 1051   VLDL 31 10/25/2013 1051   LDLCALC 118 (H) 10/25/2013 1051   HgbA1c:  Lab Results  Component Value Date   HGBA1C (H) 07/07/2010    5.8 (NOTE)                                                                       According to the ADA Clinical Practice Recommendations for 2011, when HbA1c is used as a screening test:   >=6.5%   Diagnostic of Diabetes Mellitus           (if abnormal result  is confirmed)  5.7-6.4%   Increased risk of developing  Diabetes Mellitus  References:Diagnosis and Classification of Diabetes Mellitus,Diabetes QIHK,7425,95(GLOVF 1):S62-S69 and Standards of Medical Care in         Diabetes - 2011,Diabetes IEPP,2951,88  (Suppl 1):S11-S61.   Urine Drug Screen:     Component Value Date/Time  LABOPIA NONE DETECTED 03/10/2019 2356   COCAINSCRNUR NONE DETECTED 03/12/2019 2356   LABBENZ NONE DETECTED 02/28/2019 2356   AMPHETMU NONE DETECTED 03/22/2019 2356   THCU NONE DETECTED 03/09/2019 2356   LABBARB NONE DETECTED 03/02/2019 2356    Alcohol Level     Component Value Date/Time   ETH <10 03/14/2019 1945    IMAGING  Ct Angio Head W Or Wo Contrast Ct Angio Neck W And/or Wo Contrast CT Cervical Spine  03/21/2019 IMPRESSION:   CTA NECK:  1. No hemodynamically significant stenosis ICA's.  2. Patent proximal vertebral arteries.  3. Cardiomegaly and findings of pulmonary edema.   CTA HEAD:  1. Poor intravascular contrast attributable to poor cardiac output and increased intracranial pressure, minimally technically improved after 3 attempts. No contrast opacification of posterior circulation.  2. Patent ICA, anterior middle cerebral arteries with attenuated appearance attributable to and intracranial hypertension, possible component of vasospasm.  3. 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   CT CERVICAL SPINE:  1. No fracture or malalignment.  2. Moderate canal stenosis C3-4.    Ct Cerebral Perfusion W Contrast 02/25/2019 IMPRESSION:  1. Nondiagnostic CT perfusion brain.    Dg Chest Port 1 View 03/03/2019 IMPRESSION:  1. Tip of the endotracheal tube positioned 1.9 cm above the carina.  2. Cardiomegaly with mild diffuse pulmonary vascular congestion without overt pulmonary edema.    Ct Head Code Stroke Wo Contrast 02/28/2019 IMPRESSION:  1. Acute 56 cc frontotemporal/insular acute hemorrhage. 7 mm RIGHT to LEFT midline shift.  Global edema.  2. Small volume RIGHT subarachnoid hemorrhage.  Marland Kitchen     PHYSICAL EXAM Middle-aged obese African-American lady who was intubated and unresponsive. . Afebrile. Head is nontraumatic. Neck is supple without bruit.    Cardiac exam no murmur or gallop. Lungs are clear to auscultation. Distal pulses are well felt. Neurological Exam :   unresponsive. Intubated. Not on sedation. Eyes are closed. Eyes are in primary position. Pupils 3 mm fixed and nonreactive. Corneal reflexes are absent bilaterally. Doll's eye reflexes are absent. No respiratory effort abovel ventilatory setting.Fundi were not visualized. Cough and gag are absent. Tongue is midline. No spontaneous extremity movements. No response to sternal rub in extremities. No withdrawal in lower extremity strength noxious stimuli only. The tendon reflexes are absent. Plantars are both equiv.  ASSESSMENT/PLAN  [name] is a 53 y.o. female with history of malignant HTN found unresponsive with emesis and L sided weakness who developed PEA arrest enroute.    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 Hmg secondary to HTN  Code Stroke CT head 56cc frontotemporal/insular hmg. 62mm R to L shift. Global edema.  CTA head poor contrast d/t poor CO. Patent ICA. R MCA bifurcation w/o discrete aneurysm. 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   CTA neck no sign stenosis. CM and pulm edema.   CT CS no fx, mod C3-4 canal stenosis  CT perfusion nondiagnostic  SCDs for VTE prophylaxis  aspirin 81 mg daily prior to admission, now on No antithrombotic given hemorrhage.   DNR  Hemorrhage is not survivable   Acute hypoxic respiratory failure R/o aspiration  Intubated. On vent  CCM onboard  Cardiac arrest Elevated troponin  Hypotension Hypertensive Emergency  BP 240/102 at time on EMS arrival, as high as 234/187 ->118/66  Stable . SBP goal < 160  Hyperlipidemia  Home  meds:  lipitor  40  Hyperglycemia  No hx diabetes  Other Stroke Risk Factors  Cigarette smoker  Morbid Obesity, Body mass index is 43.08 kg/m.   Other Active Problems  Hypokalemia 3.1->4.3  AI Cr 1.64  Leukocytosis 21.8    (temp 100.8)  Hospital day # 2  Personally examined patient and images, and have participated in and made any corrections needed to history, physical, neuro exam,assessment and plan as stated above.  I have personally obtained the history, evaluated lab date, reviewed imaging studies and agree with radiology interpretations.  This patient has No cranial nerve reflexes, family meeting later today to discuss one-way extubation.   Sarina Ill, MD Stroke Neurology   A total of 25 minutes was spent for the care of this patient, spent on counseling patient and family on different diagnostic and therapeutic options, counseling and coordination of care, riskd ans benefits of management, compliance, or risk factor reduction and education.         To contact Stroke Continuity provider, please refer to [email or web address] After hours, contact General Neurology

## 2019-02-26 NOTE — Progress Notes (Signed)
MD Agarwala spoke on the phone with patient's sister Tracie Estrada and daughter Tracie Estrada about discontinuing life saving treatments. Tracie Estrada is now at bedside and says Tracie Estrada How is the next of kin to make decision about withdrawing life saving treatments. Tracie Estrada is not present at bedside to make a decision.

## 2019-02-27 ENCOUNTER — Other Ambulatory Visit (HOSPITAL_COMMUNITY): Payer: Medicaid Other

## 2019-02-27 DIAGNOSIS — G9382 Brain death: Secondary | ICD-10-CM

## 2019-02-27 LAB — POCT I-STAT 7, (LYTES, BLD GAS, ICA,H+H)
Acid-base deficit: 3 mmol/L — ABNORMAL HIGH (ref 0.0–2.0)
Acid-base deficit: 5 mmol/L — ABNORMAL HIGH (ref 0.0–2.0)
Acid-base deficit: 3 mmol/L — ABNORMAL HIGH (ref 0.0–2.0)
Acid-base deficit: 4 mmol/L — ABNORMAL HIGH (ref 0.0–2.0)
Acid-base deficit: 2 mmol/L (ref 0.0–2.0)
Bicarbonate: 22.9 mmol/L (ref 20.0–28.0)
Bicarbonate: 23.9 mmol/L (ref 20.0–28.0)
Bicarbonate: 23.2 mmol/L (ref 20.0–28.0)
Bicarbonate: 26.3 mmol/L (ref 20.0–28.0)
Bicarbonate: 24.9 mmol/L (ref 20.0–28.0)
Calcium, Ion: 1.35 mmol/L (ref 1.15–1.40)
Calcium, Ion: 1.32 mmol/L (ref 1.15–1.40)
Calcium, Ion: 1.39 mmol/L (ref 1.15–1.40)
Calcium, Ion: 1.42 mmol/L — ABNORMAL HIGH (ref 1.15–1.40)
Calcium, Ion: 1.39 mmol/L (ref 1.15–1.40)
HCT: 30 % — ABNORMAL LOW (ref 36.0–46.0)
HCT: 29 % — ABNORMAL LOW (ref 36.0–46.0)
HCT: 31 % — ABNORMAL LOW (ref 36.0–46.0)
HCT: 32 % — ABNORMAL LOW (ref 36.0–46.0)
HCT: 32 % — ABNORMAL LOW (ref 36.0–46.0)
Hemoglobin: 10.2 g/dL — ABNORMAL LOW (ref 12.0–15.0)
Hemoglobin: 9.9 g/dL — ABNORMAL LOW (ref 12.0–15.0)
Hemoglobin: 10.5 g/dL — ABNORMAL LOW (ref 12.0–15.0)
Hemoglobin: 10.9 g/dL — ABNORMAL LOW (ref 12.0–15.0)
Hemoglobin: 10.9 g/dL — ABNORMAL LOW (ref 12.0–15.0)
O2 Saturation: 96 %
O2 Saturation: 96 %
O2 Saturation: 96 %
O2 Saturation: 98 %
O2 Saturation: 100 %
Patient temperature: 97.9
Patient temperature: 35.1
Patient temperature: 34.5
Patient temperature: 35.1
Patient temperature: 35.2
Potassium: 3.6 mmol/L (ref 3.5–5.1)
Potassium: 3.5 mmol/L (ref 3.5–5.1)
Potassium: 3.6 mmol/L (ref 3.5–5.1)
Potassium: 4 mmol/L (ref 3.5–5.1)
Potassium: 3.6 mmol/L (ref 3.5–5.1)
Sodium: 164 mmol/L (ref 135–145)
Sodium: 165 mmol/L (ref 135–145)
Sodium: 167 mmol/L (ref 135–145)
Sodium: 168 mmol/L (ref 135–145)
Sodium: 171 mmol/L (ref 135–145)
TCO2: 24 mmol/L (ref 22–32)
TCO2: 26 mmol/L (ref 22–32)
TCO2: 25 mmol/L (ref 22–32)
TCO2: 29 mmol/L (ref 22–32)
TCO2: 27 mmol/L (ref 22–32)
pCO2 arterial: 44 mmHg (ref 32.0–48.0)
pCO2 arterial: 59.3 mmHg — ABNORMAL HIGH (ref 32.0–48.0)
pCO2 arterial: 42.7 mmHg (ref 32.0–48.0)
pCO2 arterial: 75.8 mmHg (ref 32.0–48.0)
pCO2 arterial: 49.5 mmHg — ABNORMAL HIGH (ref 32.0–48.0)
pH, Arterial: 7.323 — ABNORMAL LOW (ref 7.350–7.450)
pH, Arterial: 7.202 — ABNORMAL LOW (ref 7.350–7.450)
pH, Arterial: 7.133 — CL (ref 7.350–7.450)
pH, Arterial: 7.334 — ABNORMAL LOW (ref 7.350–7.450)
pH, Arterial: 7.301 — ABNORMAL LOW (ref 7.350–7.450)
pO2, Arterial: 90 mmHg (ref 83.0–108.0)
pO2, Arterial: 95 mmHg (ref 83.0–108.0)
pO2, Arterial: 144 mmHg — ABNORMAL HIGH (ref 83.0–108.0)
pO2, Arterial: 80 mmHg — ABNORMAL LOW (ref 83.0–108.0)
pO2, Arterial: 264 mmHg — ABNORMAL HIGH (ref 83.0–108.0)

## 2019-02-27 LAB — CBC
HCT: 38.2 % (ref 36.0–46.0)
Hemoglobin: 12.1 g/dL (ref 12.0–15.0)
MCH: 29.4 pg (ref 26.0–34.0)
MCHC: 31.7 g/dL (ref 30.0–36.0)
MCV: 92.9 fL (ref 80.0–100.0)
Platelets: 127 10*3/uL — ABNORMAL LOW (ref 150–400)
RBC: 4.11 MIL/uL (ref 3.87–5.11)
RDW: 18 % — ABNORMAL HIGH (ref 11.5–15.5)
WBC: 17.3 10*3/uL — ABNORMAL HIGH (ref 4.0–10.5)
nRBC: 0 % (ref 0.0–0.2)

## 2019-02-27 LAB — COMPREHENSIVE METABOLIC PANEL
ALT: 48 U/L — ABNORMAL HIGH (ref 0–44)
AST: 102 U/L — ABNORMAL HIGH (ref 15–41)
Albumin: 2.1 g/dL — ABNORMAL LOW (ref 3.5–5.0)
Alkaline Phosphatase: 51 U/L (ref 38–126)
BUN: 22 mg/dL — ABNORMAL HIGH (ref 6–20)
CO2: 22 mmol/L (ref 22–32)
Calcium: 8.5 mg/dL — ABNORMAL LOW (ref 8.9–10.3)
Chloride: >130 mmol/L (ref 98–111)
Creatinine, Ser: 2.09 mg/dL — ABNORMAL HIGH (ref 0.44–1.00)
GFR calc Af Amer: 30 mL/min — ABNORMAL LOW (ref >60)
GFR calc non Af Amer: 26 mL/min — ABNORMAL LOW (ref >60)
Glucose, Bld: 98 mg/dL (ref 70–99)
Potassium: 3.5 mmol/L (ref 3.5–5.1)
Sodium: 161 mmol/L (ref 135–145)
Total Bilirubin: 0.2 mg/dL — ABNORMAL LOW (ref 0.3–1.2)
Total Protein: 5.3 g/dL — ABNORMAL LOW (ref 6.5–8.1)

## 2019-02-27 LAB — ABO/RH
ABO/RH(D): A POS
PT AG Type: POSITIVE

## 2019-02-27 LAB — DIFFERENTIAL
Abs Immature Granulocytes: 0.11 10*3/uL — ABNORMAL HIGH (ref 0.00–0.07)
Basophils Absolute: 0 10*3/uL (ref 0.0–0.1)
Basophils Relative: 0 %
Eosinophils Absolute: 0.1 10*3/uL (ref 0.0–0.5)
Eosinophils Relative: 1 %
Immature Granulocytes: 1 %
Lymphocytes Relative: 12 %
Lymphs Abs: 2.1 10*3/uL (ref 0.7–4.0)
Monocytes Absolute: 1.5 10*3/uL — ABNORMAL HIGH (ref 0.1–1.0)
Monocytes Relative: 9 %
Neutro Abs: 13.4 10*3/uL — ABNORMAL HIGH (ref 1.7–7.7)
Neutrophils Relative %: 77 %

## 2019-02-27 LAB — GLUCOSE, CAPILLARY
Glucose-Capillary: 88 mg/dL (ref 70–99)
Glucose-Capillary: 90 mg/dL (ref 70–99)

## 2019-02-27 LAB — PROTIME-INR
INR: 1.6 — ABNORMAL HIGH (ref 0.8–1.2)
Prothrombin Time: 19 seconds — ABNORMAL HIGH (ref 11.4–15.2)

## 2019-02-27 LAB — MAGNESIUM: Magnesium: 2.1 mg/dL (ref 1.7–2.4)

## 2019-02-27 LAB — PHOSPHORUS: Phosphorus: 1.8 mg/dL — ABNORMAL LOW (ref 2.5–4.6)

## 2019-02-27 LAB — APTT: aPTT: 35 seconds (ref 24–36)

## 2019-02-27 LAB — BILIRUBIN, DIRECT: Bilirubin, Direct: <0.1 mg/dL (ref 0.0–0.2)

## 2019-02-27 MED ORDER — DEXTROSE 5 % IV SOLN
INTRAVENOUS | Status: DC
  Administered 2019-02-27 – 2019-03-01 (×6): via INTRAVENOUS

## 2019-02-27 MED ORDER — NOREPINEPHRINE 4 MG/250ML-% IV SOLN
INTRAVENOUS | Status: AC
  Filled 2019-02-27: qty 250

## 2019-02-27 MED ORDER — SODIUM CHLORIDE 0.9 % IV SOLN
1000.0000 mg | INTRAVENOUS | Status: AC
  Administered 2019-02-27: 1000 mg via INTRAVENOUS
  Filled 2019-02-27: qty 8

## 2019-02-28 ENCOUNTER — Other Ambulatory Visit (HOSPITAL_COMMUNITY): Payer: Medicaid Other

## 2019-02-28 LAB — BASIC METABOLIC PANEL
BUN: 22 mg/dL — ABNORMAL HIGH (ref 6–20)
BUN: 22 mg/dL — ABNORMAL HIGH (ref 6–20)
CO2: 23 mmol/L (ref 22–32)
CO2: 22 mmol/L (ref 22–32)
Calcium: 8.6 mg/dL — ABNORMAL LOW (ref 8.9–10.3)
Calcium: 8.2 mg/dL — ABNORMAL LOW (ref 8.9–10.3)
Chloride: >130 mmol/L (ref 98–111)
Chloride: >130 mmol/L (ref 98–111)
Creatinine, Ser: 2.03 mg/dL — ABNORMAL HIGH (ref 0.44–1.00)
Creatinine, Ser: 2.27 mg/dL — ABNORMAL HIGH (ref 0.44–1.00)
GFR calc Af Amer: 31 mL/min — ABNORMAL LOW (ref >60)
GFR calc Af Amer: 27 mL/min — ABNORMAL LOW (ref >60)
GFR calc non Af Amer: 27 mL/min — ABNORMAL LOW (ref >60)
GFR calc non Af Amer: 24 mL/min — ABNORMAL LOW (ref >60)
Glucose, Bld: 216 mg/dL — ABNORMAL HIGH (ref 70–99)
Glucose, Bld: 213 mg/dL — ABNORMAL HIGH (ref 70–99)
Potassium: 4.2 mmol/L (ref 3.5–5.1)
Potassium: 4 mmol/L (ref 3.5–5.1)
Sodium: 164 mmol/L (ref 135–145)
Sodium: 164 mmol/L (ref 135–145)

## 2019-02-28 LAB — BLOOD GAS, ARTERIAL
Acid-base deficit: 2.7 mmol/L — ABNORMAL HIGH (ref 0.0–2.0)
Acid-base deficit: 2.7 mmol/L — ABNORMAL HIGH (ref 0.0–2.0)
Bicarbonate: 23.3 mmol/L (ref 20.0–28.0)
Bicarbonate: 23.3 mmol/L (ref 20.0–28.0)
Drawn by: 364961
Drawn by: 535471
FIO2: 40
FIO2: 0.4
MECHVT: 400 mL
MECHVT: 400 mL
O2 Saturation: 94.3 %
O2 Saturation: 94.3 %
PEEP: 5 cmH2O
PEEP: 5 cmH2O
Patient temperature: 96.3
Patient temperature: 96.3
RATE: 15 resp/min
RATE: 15 resp/min
pCO2 arterial: 50.1 mmHg — ABNORMAL HIGH (ref 32.0–48.0)
pCO2 arterial: 50.1 mmHg — ABNORMAL HIGH (ref 32.0–48.0)
pH, Arterial: 7.281 — ABNORMAL LOW (ref 7.350–7.450)
pH, Arterial: 7.281 — ABNORMAL LOW (ref 7.350–7.450)
pO2, Arterial: 74 mmHg — ABNORMAL LOW (ref 83.0–108.0)
pO2, Arterial: 74 mmHg — ABNORMAL LOW (ref 83.0–108.0)

## 2019-02-28 LAB — CBC
HCT: 40.3 % (ref 36.0–46.0)
HCT: 38.1 % (ref 36.0–46.0)
Hemoglobin: 12.9 g/dL (ref 12.0–15.0)
Hemoglobin: 12 g/dL (ref 12.0–15.0)
MCH: 30 pg (ref 26.0–34.0)
MCH: 28.8 pg (ref 26.0–34.0)
MCHC: 32 g/dL (ref 30.0–36.0)
MCHC: 31.5 g/dL (ref 30.0–36.0)
MCV: 93.7 fL (ref 80.0–100.0)
MCV: 91.6 fL (ref 80.0–100.0)
Platelets: 141 10*3/uL — ABNORMAL LOW (ref 150–400)
Platelets: 139 10*3/uL — ABNORMAL LOW (ref 150–400)
RBC: 4.3 MIL/uL (ref 3.87–5.11)
RBC: 4.16 MIL/uL (ref 3.87–5.11)
RDW: 18.2 % — ABNORMAL HIGH (ref 11.5–15.5)
RDW: 17.8 % — ABNORMAL HIGH (ref 11.5–15.5)
WBC: 19.3 10*3/uL — ABNORMAL HIGH (ref 4.0–10.5)
WBC: 19.7 10*3/uL — ABNORMAL HIGH (ref 4.0–10.5)
nRBC: 0 % (ref 0.0–0.2)
nRBC: 0.2 % (ref 0.0–0.2)

## 2019-02-28 LAB — COMPREHENSIVE METABOLIC PANEL
ALT: 51 U/L — ABNORMAL HIGH (ref 0–44)
ALT: 49 U/L — ABNORMAL HIGH (ref 0–44)
ALT: 46 U/L — ABNORMAL HIGH (ref 0–44)
AST: 103 U/L — ABNORMAL HIGH (ref 15–41)
AST: 85 U/L — ABNORMAL HIGH (ref 15–41)
AST: 75 U/L — ABNORMAL HIGH (ref 15–41)
Albumin: 2.1 g/dL — ABNORMAL LOW (ref 3.5–5.0)
Albumin: 2.1 g/dL — ABNORMAL LOW (ref 3.5–5.0)
Albumin: 2 g/dL — ABNORMAL LOW (ref 3.5–5.0)
Alkaline Phosphatase: 59 U/L (ref 38–126)
Alkaline Phosphatase: 66 U/L (ref 38–126)
Alkaline Phosphatase: 63 U/L (ref 38–126)
Anion gap: 6 (ref 5–15)
BUN: 22 mg/dL — ABNORMAL HIGH (ref 6–20)
BUN: 24 mg/dL — ABNORMAL HIGH (ref 6–20)
BUN: 24 mg/dL — ABNORMAL HIGH (ref 6–20)
CO2: 22 mmol/L (ref 22–32)
CO2: 23 mmol/L (ref 22–32)
CO2: 23 mmol/L (ref 22–32)
Calcium: 8.7 mg/dL — ABNORMAL LOW (ref 8.9–10.3)
Calcium: 8.5 mg/dL — ABNORMAL LOW (ref 8.9–10.3)
Calcium: 8.1 mg/dL — ABNORMAL LOW (ref 8.9–10.3)
Chloride: >130 mmol/L (ref 98–111)
Chloride: >130 mmol/L (ref 98–111)
Chloride: 130 mmol/L — ABNORMAL HIGH (ref 98–111)
Creatinine, Ser: 1.98 mg/dL — ABNORMAL HIGH (ref 0.44–1.00)
Creatinine, Ser: 2.21 mg/dL — ABNORMAL HIGH (ref 0.44–1.00)
Creatinine, Ser: 2.32 mg/dL — ABNORMAL HIGH (ref 0.44–1.00)
GFR calc Af Amer: 32 mL/min — ABNORMAL LOW (ref >60)
GFR calc Af Amer: 28 mL/min — ABNORMAL LOW (ref >60)
GFR calc Af Amer: 27 mL/min — ABNORMAL LOW (ref >60)
GFR calc non Af Amer: 28 mL/min — ABNORMAL LOW (ref >60)
GFR calc non Af Amer: 24 mL/min — ABNORMAL LOW (ref >60)
GFR calc non Af Amer: 23 mL/min — ABNORMAL LOW (ref >60)
Glucose, Bld: 211 mg/dL — ABNORMAL HIGH (ref 70–99)
Glucose, Bld: 191 mg/dL — ABNORMAL HIGH (ref 70–99)
Glucose, Bld: 235 mg/dL — ABNORMAL HIGH (ref 70–99)
Potassium: 3.9 mmol/L (ref 3.5–5.1)
Potassium: 3.8 mmol/L (ref 3.5–5.1)
Potassium: 3.7 mmol/L (ref 3.5–5.1)
Sodium: 165 mmol/L (ref 135–145)
Sodium: 166 mmol/L (ref 135–145)
Sodium: 159 mmol/L — ABNORMAL HIGH (ref 135–145)
Total Bilirubin: 0.5 mg/dL (ref 0.3–1.2)
Total Bilirubin: 0.4 mg/dL (ref 0.3–1.2)
Total Bilirubin: 0.2 mg/dL — ABNORMAL LOW (ref 0.3–1.2)
Total Protein: 5.7 g/dL — ABNORMAL LOW (ref 6.5–8.1)
Total Protein: 6 g/dL — ABNORMAL LOW (ref 6.5–8.1)
Total Protein: 5.6 g/dL — ABNORMAL LOW (ref 6.5–8.1)

## 2019-02-28 LAB — POCT I-STAT 7, (LYTES, BLD GAS, ICA,H+H)
Acid-base deficit: 2 mmol/L (ref 0.0–2.0)
Bicarbonate: 23.8 mmol/L (ref 20.0–28.0)
Calcium, Ion: 1.21 mmol/L (ref 1.15–1.40)
HCT: 32 % — ABNORMAL LOW (ref 36.0–46.0)
Hemoglobin: 10.9 g/dL — ABNORMAL LOW (ref 12.0–15.0)
O2 Saturation: 94 %
Patient temperature: 35.6
Potassium: 3.8 mmol/L (ref 3.5–5.1)
Sodium: 163 mmol/L (ref 135–145)
TCO2: 25 mmol/L (ref 22–32)
pCO2 arterial: 43.4 mmHg (ref 32.0–48.0)
pH, Arterial: 7.34 — ABNORMAL LOW (ref 7.350–7.450)
pO2, Arterial: 71 mmHg — ABNORMAL LOW (ref 83.0–108.0)

## 2019-02-28 LAB — GLUCOSE, CAPILLARY
Glucose-Capillary: 121 mg/dL — ABNORMAL HIGH (ref 70–99)
Glucose-Capillary: 150 mg/dL — ABNORMAL HIGH (ref 70–99)
Glucose-Capillary: 160 mg/dL — ABNORMAL HIGH (ref 70–99)
Glucose-Capillary: 174 mg/dL — ABNORMAL HIGH (ref 70–99)
Glucose-Capillary: 180 mg/dL — ABNORMAL HIGH (ref 70–99)
Glucose-Capillary: 191 mg/dL — ABNORMAL HIGH (ref 70–99)
Glucose-Capillary: 191 mg/dL — ABNORMAL HIGH (ref 70–99)
Glucose-Capillary: 191 mg/dL — ABNORMAL HIGH (ref 70–99)
Glucose-Capillary: 207 mg/dL — ABNORMAL HIGH (ref 70–99)
Glucose-Capillary: 209 mg/dL — ABNORMAL HIGH (ref 70–99)
Glucose-Capillary: 88 mg/dL (ref 70–99)
Glucose-Capillary: 98 mg/dL (ref 70–99)

## 2019-02-28 LAB — URINALYSIS, ROUTINE W REFLEX MICROSCOPIC
Bilirubin Urine: NEGATIVE
Glucose, UA: NEGATIVE mg/dL
Ketones, ur: NEGATIVE mg/dL
Nitrite: NEGATIVE
Protein, ur: NEGATIVE mg/dL
Specific Gravity, Urine: 1.005 (ref 1.005–1.030)
pH: 5 (ref 5.0–8.0)

## 2019-02-28 LAB — PROTIME-INR
INR: 1.6 — ABNORMAL HIGH (ref 0.8–1.2)
INR: 1.8 — ABNORMAL HIGH (ref 0.8–1.2)
Prothrombin Time: 18.6 seconds — ABNORMAL HIGH (ref 11.4–15.2)
Prothrombin Time: 20.8 seconds — ABNORMAL HIGH (ref 11.4–15.2)

## 2019-02-28 LAB — FIBRINOGEN: Fibrinogen: <60 mg/dL — CL (ref 210–475)

## 2019-02-28 LAB — TYPE AND SCREEN
ABO/RH(D): A POS
Antibody Screen: NEGATIVE

## 2019-02-28 LAB — ABO/RH
ABO/RH(D): A POS
PT AG Type: POSITIVE

## 2019-02-28 LAB — PREGNANCY, URINE: Preg Test, Ur: NEGATIVE

## 2019-02-28 LAB — APTT: aPTT: 38 seconds — ABNORMAL HIGH (ref 24–36)

## 2019-02-28 MED ORDER — LABETALOL HCL 5 MG/ML IV SOLN
20.0000 mg | INTRAVENOUS | Status: DC | PRN
  Administered 2019-02-28: 20 mg via INTRAVENOUS

## 2019-02-28 MED ORDER — LABETALOL HCL 5 MG/ML IV SOLN
40.0000 mg | INTRAVENOUS | Status: DC | PRN
  Administered 2019-02-28: 40 mg via INTRAVENOUS
  Filled 2019-02-28: qty 8

## 2019-02-28 MED ORDER — POTASSIUM PHOSPHATES 15 MMOLE/5ML IV SOLN
30.0000 mmol | Freq: Once | INTRAVENOUS | Status: AC
  Administered 2019-02-28: 30 mmol via INTRAVENOUS
  Filled 2019-02-28: qty 10

## 2019-02-28 MED ORDER — LABETALOL HCL 5 MG/ML IV SOLN
INTRAVENOUS | Status: AC
  Administered 2019-02-28: 10 mg via INTRAVENOUS
  Filled 2019-02-28: qty 4

## 2019-02-28 MED ORDER — SODIUM CHLORIDE 0.9% FLUSH
10.0000 mL | Freq: Two times a day (BID) | INTRAVENOUS | Status: DC
  Administered 2019-02-28 (×2): 10 mL

## 2019-02-28 MED ORDER — LABETALOL HCL 5 MG/ML IV SOLN
10.0000 mg | INTRAVENOUS | Status: DC | PRN
  Administered 2019-02-28: 10 mg via INTRAVENOUS
  Filled 2019-02-28: qty 4

## 2019-02-28 MED ORDER — SODIUM CHLORIDE 0.9 % IV SOLN
3.0000 g | Freq: Two times a day (BID) | INTRAVENOUS | Status: DC
  Administered 2019-03-01: 3 g via INTRAVENOUS
  Filled 2019-02-28 (×3): qty 3

## 2019-02-28 MED ORDER — INSULIN ASPART 100 UNIT/ML ~~LOC~~ SOLN: 1.0000 [IU] | SUBCUTANEOUS | Status: DC

## 2019-02-28 MED ORDER — LABETALOL HCL 5 MG/ML IV SOLN
5.0000 mg | INTRAVENOUS | Status: AC
  Administered 2019-02-28: 5 mg via INTRAVENOUS
  Filled 2019-02-28: qty 4

## 2019-02-28 MED ORDER — INSULIN ASPART 100 UNIT/ML ~~LOC~~ SOLN
1.0000 [IU] | SUBCUTANEOUS | Status: DC
  Administered 2019-02-28: 2 [IU] via SUBCUTANEOUS
  Administered 2019-02-28: 3 [IU] via SUBCUTANEOUS
  Administered 2019-02-28: 2 [IU] via SUBCUTANEOUS

## 2019-02-28 MED ORDER — NITROPRUSSIDE SODIUM-NACL 20-0.9 MG/100ML-% IV SOLN
0.5000 ug/kg/min | INTRAVENOUS | Status: DC
  Administered 2019-02-28 – 2019-03-01 (×2): 0.5 ug/kg/min via INTRAVENOUS
  Filled 2019-02-28 (×2): qty 100

## 2019-02-28 MED ORDER — SODIUM CHLORIDE 0.9% FLUSH: 10.0000 mL | INTRAVENOUS | Status: DC | PRN

## 2019-02-28 MED ORDER — ARTIFICIAL TEARS OPHTHALMIC OINT
TOPICAL_OINTMENT | OPHTHALMIC | Status: DC | PRN
  Administered 2019-02-28: 13:00:00 via OPHTHALMIC
  Filled 2019-02-28: qty 3.5

## 2019-02-28 MED ORDER — NITROPRUSSIDE SODIUM-NACL 10-0.9 MG/50ML-% IV SOLN
0.5000 ug/kg/min | INTRAVENOUS | Status: DC
  Filled 2019-02-28: qty 50

## 2019-02-28 MED ORDER — LABETALOL HCL 5 MG/ML IV SOLN
10.0000 mg | Freq: Once | INTRAVENOUS | Status: AC
  Administered 2019-02-28: 10 mg via INTRAVENOUS

## 2019-02-28 NOTE — Progress Notes (Signed)
Patient transported to CT and back without complications. RN at bedside.  

## 2019-02-28 NOTE — Procedures (Signed)
  Procedure: CT guided core liver biopsy 18g x3 EBL:   minimal Complications:  none immediate  See full dictation in [facility].  Dillard Cannon MD Main # [phone] Pager  [phone]

## 2019-02-28 NOTE — Progress Notes (Signed)
Pharmacy Antibiotic Note  Tracie Estrada is a 53 y.o. female admitted on 03/10/2019 with cardiac arrest with ICH.  Pharmacy has been consulted for Unasyn dosing for aspiration PNA. SCr is 2.21 this afternoon (CrCl 28 mL/min), very poor prognosis of recovery at this time.  Plan: Unasyn 3gm IV q 12h given SCr Will f/u plans of care  Height: 4' 9.87" (147 cm) Weight: 203 lb 0.7 oz (92.1 kg) IBW/kg (Calculated) : 40.61  Temp (24hrs), Avg:96.8 F (36 C), Min:94.1 F (34.5 C), Max:99.3 F (37.4 C)  Recent Labs  Lab 03/19/2019 1945  02/25/19 0440 03/11/19 2202 02/28/19 0407 02/28/19 0815 02/28/19 1200  WBC 11.5*  --  21.8* 17.3* 19.3*  --   --   CREATININE 1.70*   < > 1.64* 2.09* 1.98* 2.03* 2.21*   < > = values in this interval not displayed.    Estimated Creatinine Clearance: 28.1 mL/min (A) (by C-G formula based on SCr of 2.21 mg/dL (H)).    Allergies  Allergen Reactions  . Fish Allergy Anaphylaxis  . Peanuts [Peanut Oil] Anaphylaxis  . Other Other (See Comments)    Anesthesia.  Specific agent unknown.  Reaction unknown.   Marland Kitchen Penicillins Itching    Did it involve swelling of the face/tongue/throat, SOB, or low BP? Unk Did it involve sudden or severe rash/hives, skin peeling, or any reaction on the inside of your mouth or nose? Unk Did you need to seek medical attention at a hospital or doctor's office? Unk When did it last happen? Unk If all above answers are "NO", may proceed with cephalosporin use.   . Vancomycin Other (See Comments)    Nephrotoxicity    Antimicrobials this admission: 2/4 Unasyn >>   Microbiology results: MRSA negative   Antonietta Jewel, PharmD, BCCCP Clinical Pharmacist  Pager: [phone] Phone: [phone] Please check AMION for all [place] numbers 02/28/2019

## 2019-02-28 NOTE — Progress Notes (Signed)
Recruitment maneuver done on patient Q4 per [place] donor services. RT performed at this time and will  Perform again at next vent check.

## 2019-03-01 ENCOUNTER — Encounter (HOSPITAL_COMMUNITY): Payer: Medicaid Other | Admitting: Certified Registered"

## 2019-03-01 ENCOUNTER — Other Ambulatory Visit (HOSPITAL_COMMUNITY): Payer: Medicaid Other

## 2019-03-01 ENCOUNTER — Inpatient Hospital Stay (HOSPITAL_COMMUNITY): Payer: Medicaid Other | Admitting: Certified Registered"

## 2019-03-01 ENCOUNTER — Encounter (HOSPITAL_COMMUNITY): Admission: EM | Disposition: E | Payer: Self-pay | Source: Home / Self Care | Attending: Pulmonary Disease

## 2019-03-01 HISTORY — PX: ORGAN PROCUREMENT: SHX5270

## 2019-03-01 LAB — URINALYSIS, ROUTINE W REFLEX MICROSCOPIC
Bilirubin Urine: NEGATIVE
Glucose, UA: NEGATIVE mg/dL
Ketones, ur: NEGATIVE mg/dL
Leukocytes,Ua: NEGATIVE
Nitrite: NEGATIVE
Protein, ur: NEGATIVE mg/dL
Specific Gravity, Urine: 1.005 (ref 1.005–1.030)
pH: 5 (ref 5.0–8.0)

## 2019-03-01 LAB — COMPREHENSIVE METABOLIC PANEL
ALT: 43 U/L (ref 0–44)
AST: 57 U/L — ABNORMAL HIGH (ref 15–41)
Albumin: 1.8 g/dL — ABNORMAL LOW (ref 3.5–5.0)
Alkaline Phosphatase: 59 U/L (ref 38–126)
Anion gap: 6 (ref 5–15)
BUN: 25 mg/dL — ABNORMAL HIGH (ref 6–20)
CO2: 22 mmol/L (ref 22–32)
Calcium: 7.7 mg/dL — ABNORMAL LOW (ref 8.9–10.3)
Chloride: 129 mmol/L — ABNORMAL HIGH (ref 98–111)
Creatinine, Ser: 2.25 mg/dL — ABNORMAL HIGH (ref 0.44–1.00)
GFR calc Af Amer: 28 mL/min — ABNORMAL LOW (ref >60)
GFR calc non Af Amer: 24 mL/min — ABNORMAL LOW (ref >60)
Glucose, Bld: 241 mg/dL — ABNORMAL HIGH (ref 70–99)
Potassium: 3.7 mmol/L (ref 3.5–5.1)
Sodium: 157 mmol/L — ABNORMAL HIGH (ref 135–145)
Total Bilirubin: 0.4 mg/dL (ref 0.3–1.2)
Total Protein: 5 g/dL — ABNORMAL LOW (ref 6.5–8.1)

## 2019-03-01 LAB — BASIC METABOLIC PANEL
BUN: 25 mg/dL — ABNORMAL HIGH (ref 6–20)
CO2: 23 mmol/L (ref 22–32)
Calcium: 7.7 mg/dL — ABNORMAL LOW (ref 8.9–10.3)
Chloride: >130 mmol/L (ref 98–111)
Creatinine, Ser: 2.34 mg/dL — ABNORMAL HIGH (ref 0.44–1.00)
GFR calc Af Amer: 26 mL/min — ABNORMAL LOW (ref >60)
GFR calc non Af Amer: 23 mL/min — ABNORMAL LOW (ref >60)
Glucose, Bld: 240 mg/dL — ABNORMAL HIGH (ref 70–99)
Potassium: 3.8 mmol/L (ref 3.5–5.1)
Sodium: 160 mmol/L — ABNORMAL HIGH (ref 135–145)

## 2019-03-01 LAB — GLUCOSE, CAPILLARY
Glucose-Capillary: 212 mg/dL — ABNORMAL HIGH (ref 70–99)
Glucose-Capillary: 223 mg/dL — ABNORMAL HIGH (ref 70–99)
Glucose-Capillary: 229 mg/dL — ABNORMAL HIGH (ref 70–99)

## 2019-03-01 SURGERY — SURGICAL PROCUREMENT, ORGAN
Anesthesia: General

## 2019-03-01 MED ORDER — ROCURONIUM BROMIDE 50 MG/5ML IV SOSY
PREFILLED_SYRINGE | INTRAVENOUS | Status: DC | PRN
  Administered 2019-03-01: 50 mg via INTRAVENOUS

## 2019-03-01 MED ORDER — METRONIDAZOLE IVPB CUSTOM
1000.0000 mg | Freq: Once | INTRAVENOUS | Status: AC
  Administered 2019-03-01: 1000 mg via INTRAVENOUS
  Filled 2019-03-01: qty 200

## 2019-03-01 MED ORDER — HEPARIN SODIUM (PORCINE) 1000 UNIT/ML IJ SOLN
INTRAMUSCULAR | Status: AC
  Filled 2019-03-01: qty 1

## 2019-03-01 MED ORDER — ROCURONIUM BROMIDE 50 MG/5ML IV SOSY
PREFILLED_SYRINGE | INTRAVENOUS | Status: AC
  Filled 2019-03-01: qty 5

## 2019-03-01 MED ORDER — HEPARIN SODIUM (PORCINE) 1000 UNIT/ML IJ SOLN
INTRAMUSCULAR | Status: DC | PRN
  Administered 2019-03-01: 30000 [IU] via INTRAVENOUS

## 2019-03-01 MED ORDER — 0.9 % SODIUM CHLORIDE (POUR BTL) OPTIME
TOPICAL | Status: DC | PRN
  Administered 2019-03-01 (×8): 1000 mL

## 2019-03-01 MED ORDER — CIPROFLOXACIN IN D5W 400 MG/200ML IV SOLN
400.0000 mg | INTRAVENOUS | Status: AC
  Administered 2019-03-01: 400 mg via INTRAVENOUS
  Filled 2019-03-01: qty 200

## 2019-03-01 MED ORDER — FENTANYL CITRATE (PF) 250 MCG/5ML IJ SOLN
INTRAMUSCULAR | Status: AC
  Filled 2019-03-01: qty 5

## 2019-03-01 MED ORDER — SODIUM CHLORIDE 0.9 % IV SOLN
1000.0000 mg | Freq: Once | INTRAVENOUS | Status: AC
  Administered 2019-03-01: 1000 mg via INTRAVENOUS
  Filled 2019-03-01: qty 8

## 2019-03-01 MED ORDER — INSULIN ASPART 100 UNIT/ML ~~LOC~~ SOLN
0.0000 [IU] | SUBCUTANEOUS | Status: DC
  Administered 2019-03-01 (×2): 5 [IU] via SUBCUTANEOUS

## 2019-03-01 MED ORDER — FENTANYL CITRATE (PF) 100 MCG/2ML IJ SOLN
INTRAMUSCULAR | Status: DC | PRN
  Administered 2019-03-01: 100 ug via INTRAVENOUS

## 2019-03-01 SURGICAL SUPPLY — 51 items
BLADE STERNUM SYSTEM 6 (BLADE) ×2 IMPLANT
CLIP VESOCCLUDE MED 6/CT (CLIP) ×2 IMPLANT
CONT SPEC 4OZ CLIKSEAL STRL BL (MISCELLANEOUS) ×9 IMPLANT
COVER BACK TABLE 60X90IN (DRAPES) ×2 IMPLANT
COVER MAYO STAND STRL (DRAPES) ×2 IMPLANT
COVER SURGICAL LIGHT HANDLE (MISCELLANEOUS) ×5 IMPLANT
DRAPE SLUSH MACHINE 52X66 (DRAPES) ×3 IMPLANT
DRAPE SLUSH/WARMER DISC (DRAPES) ×2 IMPLANT
DRSG COVADERM 4X10 (GAUZE/BANDAGES/DRESSINGS) ×4 IMPLANT
DRSG TELFA 3X8 NADH (GAUZE/BANDAGES/DRESSINGS) ×6 IMPLANT
ELECT BLADE 6.5 EXT (BLADE) ×2 IMPLANT
ELECT REM PT RETURN 9FT ADLT (ELECTROSURGICAL) ×3
ELECTRODE REM PT RTRN 9FT ADLT (ELECTROSURGICAL) ×2 IMPLANT
GAUZE 4X4 16PLY RFD (DISPOSABLE) ×2 IMPLANT
GLOVE BIO SURGEON STRL SZ7 (GLOVE) ×2 IMPLANT
GLOVE BIO SURGEON STRL SZ7.5 (GLOVE) ×2 IMPLANT
GLOVE BIOGEL PI IND STRL 6.5 (GLOVE) IMPLANT
GLOVE BIOGEL PI IND STRL 7.5 (GLOVE) IMPLANT
GLOVE BIOGEL PI INDICATOR 6.5 (GLOVE) ×4
GLOVE BIOGEL PI INDICATOR 7.5 (GLOVE) ×2
GLOVE SURG SS PI 7.0 STRL IVOR (GLOVE) ×8 IMPLANT
GOWN STRL REUS W/ TWL LRG LVL3 (GOWN DISPOSABLE) ×4 IMPLANT
GOWN STRL REUS W/ TWL XL LVL3 (GOWN DISPOSABLE) ×2 IMPLANT
GOWN STRL REUS W/TWL LRG LVL3 (GOWN DISPOSABLE) ×12
GOWN STRL REUS W/TWL XL LVL3 (GOWN DISPOSABLE) ×3
KIT POST MORTEM ADULT 36X90 (BAG) ×3 IMPLANT
KIT TURNOVER KIT B (KITS) ×3 IMPLANT
MANIFOLD NEPTUNE II (INSTRUMENTS) ×3 IMPLANT
NDL BIOPSY 14X6 SOFT TISS (NEEDLE) ×1 IMPLANT
NEEDLE BIOPSY 14X6 SOFT TISS (NEEDLE) ×3 IMPLANT
NS IRRIG 1000ML POUR BTL (IV SOLUTION) ×16 IMPLANT
PACK AORTA (CUSTOM PROCEDURE TRAY) ×3 IMPLANT
PAD ARMBOARD 7.5X6 YLW CONV (MISCELLANEOUS) ×6 IMPLANT
PAD DRESSING TELFA 3X8 NADH (GAUZE/BANDAGES/DRESSINGS) ×1 IMPLANT
PENCIL BUTTON HOLSTER BLD 10FT (ELECTRODE) ×3 IMPLANT
SOL PREP POV-IOD 4OZ 10% (MISCELLANEOUS) ×6 IMPLANT
SPONGE INTESTINAL PEANUT (DISPOSABLE) ×3 IMPLANT
STAPLER VISISTAT 35W (STAPLE) ×3 IMPLANT
SUCTION POOLE TIP (SUCTIONS) ×6 IMPLANT
SUT BONE WAX W31G (SUTURE) ×5 IMPLANT
SUT CHROMIC 2 0 SH (SUTURE) ×2 IMPLANT
SUT ETHILON 1 LR 30 (SUTURE) ×4 IMPLANT
SUT ETHILON 2 LR (SUTURE) ×8 IMPLANT
SUT ETHILON O TP 1 (SUTURE) ×2 IMPLANT
SUT SILK 2 0 PERMA HAND 18 BK (SUTURE) ×2 IMPLANT
SUT SILK 2 0 SH (SUTURE) ×4 IMPLANT
SYRINGE TOOMEY DISP (SYRINGE) ×3 IMPLANT
TUBE CONNECTING 12'X1/4 (SUCTIONS) ×1
TUBE CONNECTING 12X1/4 (SUCTIONS) ×2 IMPLANT
WATER STERILE IRR 1000ML POUR (IV SOLUTION) ×2 IMPLANT
YANKAUER SUCT BULB TIP NO VENT (SUCTIONS) ×5 IMPLANT

## 2019-03-01 NOTE — Anesthesia Preprocedure Evaluation (Signed)
Anesthesia Evaluation  Patient identified by MRN, date of birth, ID band Patient unresponsive    Reviewed: Patient's Chart, lab work & pertinent test results, Unable to perform ROS - Chart review only  Airway Mallampati: Intubated       Dental   Pulmonary Current Smoker,    + rhonchi        Cardiovascular hypertension,  Rhythm:Regular Rate:Tachycardia     Neuro/Psych    GI/Hepatic   Endo/Other    Renal/GU      Musculoskeletal   Abdominal (+) + obese,   Peds  Hematology   Anesthesia Other Findings   Reproductive/Obstetrics                             Anesthesia Physical Anesthesia Plan  ASA: VI  Anesthesia Plan: General   Post-op Pain Management:    Induction: Intravenous  PONV Risk Score and Plan:   Airway Management Planned: Oral ETT  Additional Equipment: Arterial line  Intra-op Plan:   Post-operative Plan:   Informed Consent: I have reviewed the patients History and Physical, chart, labs and discussed the procedure including the risks, benefits and alternatives for the proposed anesthesia with the patient or authorized representative who has indicated his/her understanding and acceptance.       Plan Discussed with: CRNA and Anesthesiologist  Anesthesia Plan Comments:         Anesthesia Quick Evaluation

## 2019-03-01 NOTE — Progress Notes (Signed)
Recruitment maneuver done per CDS order. Pt tolerated recruitment with no issues to report.

## 2019-03-01 NOTE — Anesthesia Postprocedure Evaluation (Signed)
Anesthesia Post Note  Patient: Tracie Estrada  Procedure(s) Performed: [place] (N/A )     Patient location during evaluation: Other Anesthesia Type: General Anesthetic complications: no Comments: Patient brought to OR for organ donation. She expired in the OR.    Last Vitals:  Vitals:   02/28/2019 0700 03/06/2019 0730  BP: (!) 147/77 (!) 144/76  Pulse: 82 83  Resp: 18 18  Temp: (!) 36.2 C (!) 36.3 C  SpO2: 96% 95%    Last Pain:  Vitals:   03/16/2019 0400  TempSrc: Esophageal                 Tikisha Molinaro COKER

## 2019-03-01 NOTE — Progress Notes (Signed)
Just notified by [facility] hold for body has been lifted and will not be an ME case.

## 2019-03-01 NOTE — Progress Notes (Signed)
Recruitment maneuver done during vent check per CDS order. No issues to report

## 2019-03-01 NOTE — OR Nursing (Signed)
Organ Procurement  Cross Clamp time  @ 10:32am. Liver removed from patient @ 10:58am. Kidneys removed from patient @ 11:05am.

## 2019-03-01 NOTE — Progress Notes (Signed)
Notified by [facility] of hold for possible ME case.

## 2019-03-01 NOTE — Transfer of Care (Addendum)
Pt is organ donor with CDS. Anesthesia complete with aortic cross clamp at 1032.  Dewitt Hoes, CRNA

## 2019-03-02 ENCOUNTER — Encounter (HOSPITAL_COMMUNITY): Payer: Self-pay

## 2019-03-02 LAB — URINE CULTURE: Culture: NO GROWTH

## 2019-03-04 LAB — CULTURE, BLOOD (ROUTINE X 2)
Culture: NO GROWTH
Culture: NO GROWTH
Special Requests: ADEQUATE
Special Requests: ADEQUATE

## 2019-03-11 ENCOUNTER — Telehealth: Payer: Self-pay | Admitting: Pulmonary Disease

## 2019-03-11 NOTE — Telephone Encounter (Signed)
03/11/19 I received another D/C for this patient apparently the original one that [place] signed was lost somewhere between [facility] and the [facility]. Will send this to Dr. Lynetta Mare to sign at [facility]. PWR.   03/14/2018 received D/C back from [facility]., PWR

## 2019-03-25 NOTE — Death Summary Note (Signed)
DEATH SUMMARY   Patient Details  Name: Tracie Estrada MRN: [id number] DOB: January 07, 1966  Admission/Discharge Information   Admit Date:  March 22, 2019  Date of Death: Date of Death: 2019/03/25  Time of Death: Time of Death: 03-13-05  Length of Stay: 5  Referring Physician: Patient, No Pcp Per   Reason(s) for Hospitalization  Fall  Diagnoses  Preliminary cause of death: Intracerebral hemorrhage. Secondary Diagnoses (including complications and co-morbidities):  Active Problems:   Cardiac arrest [facility])   Intracerebral hemorrhage   Cytotoxic brain edema (HCC)   Brain herniation ([place])   Comatose [facility])   Brief Hospital Course (including significant findings, care, treatment, and services provided and events leading to death)  Tracie Estrada is a 53 y.o. year old female who suffered an unwitnessed fall. On arrival she suffered a brief cardiac arrest after vomiting and aspiration.  CT revealed large inoperable cerebral hemorrhage.  Exam eventually progressed to death by neurological criteria.  Patient was selected as organ donor. Family consented to procurement.    Pertinent Labs and Studies  Significant Diagnostic Studies Ct Abdomen Pelvis Wo Contrast  Addendum Date: Mar 26, 2019   ADDENDUM REPORT: 03/26/19 17:07 ADDENDUM: Liver maximum dimensions: Transverse 16.9 cm, AP 14.8 cm, craniocaudal 15.1 cm. Electronically Signed   By: Lucrezia Europe M.D.   On: 2019/03/26 17:07   Result Date: 03-26-2019 CLINICAL DATA:  Brain death, pre transplant donor evaluation EXAM: CT ABDOMEN AND PELVIS WITHOUT CONTRAST TECHNIQUE: Multidetector CT imaging of the abdomen and pelvis was performed following the standard protocol without IV contrast. COMPARISON:  01/02/2019 FINDINGS: Lower chest: Small bilateral pleural effusions. Consolidation/atelectasis posteriorly in both lung bases. No pericardial effusion. Hepatobiliary: Liver unremarkable. Contrast in the nondilated gallbladder from previous administration. No acute  findings. Pancreas: Unremarkable. No pancreatic ductal dilatation or surrounding inflammatory changes. Spleen: Normal in size without focal abnormality. Adrenals/Urinary Tract: Small bilateral renal calcifications measuring up to 3 mm in the left upper pole, 1 mm mid right kidney. No hydronephrosis or border deforming renal mass. Adrenals unremarkable. Urinary bladder decompressed by Foley catheter. Stomach/Bowel: Nasogastric tube extends into the nondilated stomach. Small bowel decompressed. Normal appendix. Lipomatous ileocecal valve. Residual oral contrast material in the colon which is nondistended. No regional inflammatory change or abscess. Vascular/Lymphatic: Mild aortoiliac calcified atheromatous plaque without aneurysm. Reproductive: Uterus and bilateral adnexa are unremarkable. Other: No ascites. No free air. Bilateral pelvic phleboliths. Musculoskeletal: Lower lumbar facet DJD. No fracture or worrisome bone lesion. IMPRESSION: 1. No acute findings. 2. Bilateral nephrolithiasis without hydronephrosis. 3. Small bilateral pleural effusions, with consolidation/atelectasis in the lung bases. Electronically Signed: By: Lucrezia Europe M.D. On: Mar 26, 2019 10:02   Ct Angio Head W Or Wo Contrast  Result Date: 03-22-19 CLINICAL DATA:  Follow up intracranial hemorrhage. History of hypertension. EXAM: CT ANGIOGRAPHY HEAD AND NECK TECHNIQUE: Multidetector CT imaging of the head and neck was performed using the standard protocol during bolus administration of intravenous contrast. Multiplanar CT image reconstructions and MIPs were obtained to evaluate the vascular anatomy. Carotid stenosis measurements (when applicable) are obtained utilizing NASCET criteria, using the distal internal carotid diameter as the denominator. COMPARISON:  CT HEAD March 22, 2019 and CT neck March 14, 2018 FINDINGS: Examination was performed 3 times as contrast was not well demonstrated intracranially. CTA NECK FINDINGS: AORTIC ARCH: Normal  appearance of the thoracic arch, normal branch pattern. Mild calcific atherosclerosis aortic arch. The origins of the innominate, left Common carotid artery and subclavian artery are patent. RIGHT CAROTID SYSTEM: Common carotid artery is  patent, mild luminal irregularity compatible with atherosclerosis. Normal appearance of the carotid bifurcation without hemodynamically significant stenosis by NASCET criteria. Gradual loss of contrast opacification internal cerebral artery. LEFT CAROTID SYSTEM: Common carotid artery is patent, mild luminal irregularity compatible with atherosclerosis. Normal appearance of the carotid bifurcation without hemodynamically significant stenosis by NASCET criteria. Gradual loss of contrast internal cerebral artery. VERTEBRAL ARTERIES:RIGHT vertebral artery is dominant. Gradual loss of bilateral vertebral arteries contrast opacification at V2 segment. SKELETON: See below.  Small LEFT trapezius intramuscular lipoma. OTHER NECK: Soft tissues of the neck are nonacute though, not tailored for evaluation. UPPER CHEST: Heart is enlarged, incompletely assessed. Dependent consolidation with pulmonary vascular congestion most compatible with pulmonary edema. Partially imaged suspected RIGHT hilar lymphadenopathy. Endotracheal tube tip immediately above the carina. CTA HEAD FINDINGS: Limited assessment due to poor intracranial contrast. ANTERIOR CIRCULATION: Bilateral internal cerebral arteries are patent. Narrowed and irregular anterior middle cerebral arteries old thus appearance of the RIGHT MCA bifurcation with into cutoff. In addition, bulbous A2 3 junction measuring to 2 mm. POSTERIOR CIRCULATION: No discernible contrast within the posterior circulation. VENOUS SINUSES: Contrast within the superior sagittal sinus and to lesser extent transverse sinus, likely narrowed due to intracranial hypertension. ANATOMIC VARIANTS: None. DELAYED PHASE: No abnormal intracranial enhancement though limited  by presence of acute blood products. MIP images reviewed. CT CERVICAL SPINE: ALIGNMENT: Straightened lordosis.  No malalignment. SKULL BASE AND VERTEBRAE: Cervical vertebral bodies and posterior elements are intact. Severe C3-4 disc height loss with endplate sclerosis and marginal spurring compatible with degenerative discs. Moderate to severe C4-5 and moderate C6-7 degenerative discs. C1-2 articulation maintained. No destructive bony lesions. SOFT TISSUES AND SPINAL CANAL: See above. DISC LEVELS: Moderate canal stenosis C3-4. No high-grade neural foraminal narrowing. UPPER CHEST: Lung apices are clear. OTHER: None. IMPRESSION: CTA NECK: 1. No hemodynamically significant stenosis ICA's. 2. Patent proximal vertebral arteries. 3. Cardiomegaly and findings of pulmonary edema. CTA HEAD: 1. Poor intravascular contrast attributable to poor cardiac output and increased intracranial pressure, minimally technically improved after 3 attempts. No contrast opacification of posterior circulation. 2. Patent ICA, anterior middle cerebral arteries with attenuated appearance attributable to and intracranial hypertension, possible component of vasospasm. 3. 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 CT CERVICAL SPINE: 1. No fracture or malalignment. 2. Moderate canal stenosis C3-4. Critical Value/emergent results were called by telephone at the time of interpretation on 03/24/2019 at 8:30 pm to Dr. Leonel Ramsay, Neurology, who verbally acknowledged these results. Aortic Atherosclerosis (ICD10-I70.0). Electronically Signed   By: Elon Alas M.D.   On: 03/07/2019 20:59   Dg Abd 1 View  Result Date: 03/25/19 CLINICAL DATA:  Orogastric tube placement EXAM: ABDOMEN - 1 VIEW COMPARISON:  None. FINDINGS: Tip and side port of orogastric tube project over the gastric body. No dilated bowel is visualized. IMPRESSION: Orogastric tube tip and side port project over  the gastric body. Electronically Signed   By: Ulyses Jarred M.D.   On: 03/25/19 23:26   Ct Angio Neck W And/or Wo Contrast  Result Date: 03/23/2019 CLINICAL DATA:  Follow up intracranial hemorrhage. History of hypertension. EXAM: CT ANGIOGRAPHY HEAD AND NECK TECHNIQUE: Multidetector CT imaging of the head and neck was performed using the standard protocol during bolus administration of intravenous contrast. Multiplanar CT image reconstructions and MIPs were obtained to evaluate the vascular anatomy. Carotid stenosis measurements (when applicable) are obtained utilizing NASCET criteria, using the distal internal carotid diameter as the denominator. COMPARISON:  CT HEAD February 24, 2019 and CT neck March 14, 2018 FINDINGS: Examination was performed 3 times as contrast was not well demonstrated intracranially. CTA NECK FINDINGS: AORTIC ARCH: Normal appearance of the thoracic arch, normal branch pattern. Mild calcific atherosclerosis aortic arch. The origins of the innominate, left Common carotid artery and subclavian artery are patent. RIGHT CAROTID SYSTEM: Common carotid artery is patent, mild luminal irregularity compatible with atherosclerosis. Normal appearance of the carotid bifurcation without hemodynamically significant stenosis by NASCET criteria. Gradual loss of contrast opacification internal cerebral artery. LEFT CAROTID SYSTEM: Common carotid artery is patent, mild luminal irregularity compatible with atherosclerosis. Normal appearance of the carotid bifurcation without hemodynamically significant stenosis by NASCET criteria. Gradual loss of contrast internal cerebral artery. VERTEBRAL ARTERIES:RIGHT vertebral artery is dominant. Gradual loss of bilateral vertebral arteries contrast opacification at V2 segment. SKELETON: See below.  Small LEFT trapezius intramuscular lipoma. OTHER NECK: Soft tissues of the neck are nonacute though, not tailored for evaluation. UPPER CHEST: Heart is enlarged, incompletely  assessed. Dependent consolidation with pulmonary vascular congestion most compatible with pulmonary edema. Partially imaged suspected RIGHT hilar lymphadenopathy. Endotracheal tube tip immediately above the carina. CTA HEAD FINDINGS: Limited assessment due to poor intracranial contrast. ANTERIOR CIRCULATION: Bilateral internal cerebral arteries are patent. Narrowed and irregular anterior middle cerebral arteries old thus appearance of the RIGHT MCA bifurcation with into cutoff. In addition, bulbous A2 3 junction measuring to 2 mm. POSTERIOR CIRCULATION: No discernible contrast within the posterior circulation. VENOUS SINUSES: Contrast within the superior sagittal sinus and to lesser extent transverse sinus, likely narrowed due to intracranial hypertension. ANATOMIC VARIANTS: None. DELAYED PHASE: No abnormal intracranial enhancement though limited by presence of acute blood products. MIP images reviewed. CT CERVICAL SPINE: ALIGNMENT: Straightened lordosis.  No malalignment. SKULL BASE AND VERTEBRAE: Cervical vertebral bodies and posterior elements are intact. Severe C3-4 disc height loss with endplate sclerosis and marginal spurring compatible with degenerative discs. Moderate to severe C4-5 and moderate C6-7 degenerative discs. C1-2 articulation maintained. No destructive bony lesions. SOFT TISSUES AND SPINAL CANAL: See above. DISC LEVELS: Moderate canal stenosis C3-4. No high-grade neural foraminal narrowing. UPPER CHEST: Lung apices are clear. OTHER: None. IMPRESSION: CTA NECK: 1. No hemodynamically significant stenosis ICA's. 2. Patent proximal vertebral arteries. 3. Cardiomegaly and findings of pulmonary edema. CTA HEAD: 1. Poor intravascular contrast attributable to poor cardiac output and increased intracranial pressure, minimally technically improved after 3 attempts. No contrast opacification of posterior circulation. 2. Patent ICA, anterior middle cerebral arteries with attenuated appearance attributable to  and intracranial hypertension, possible component of vasospasm. 3. 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 CT CERVICAL SPINE: 1. No fracture or malalignment. 2. Moderate canal stenosis C3-4. Critical Value/emergent results were called by telephone at the time of interpretation on 03/11/2019 at 8:30 pm to Dr. Leonel Ramsay, Neurology, who verbally acknowledged these results. Aortic Atherosclerosis (ICD10-I70.0). Electronically Signed   By: Elon Alas M.D.   On: 03/05/2019 20:59   Ct C-spine No Charge  Result Date: 02/28/2019 CLINICAL DATA:  Follow up intracranial hemorrhage. History of hypertension. EXAM: CT ANGIOGRAPHY HEAD AND NECK TECHNIQUE: Multidetector CT imaging of the head and neck was performed using the standard protocol during bolus administration of intravenous contrast. Multiplanar CT image reconstructions and MIPs were obtained to evaluate the vascular anatomy. Carotid stenosis measurements (when applicable) are obtained utilizing NASCET criteria, using the distal internal carotid diameter as the denominator. COMPARISON:  CT HEAD February 24, 2019 and CT  neck March 14, 2018 FINDINGS: Examination was performed 3 times as contrast was not well demonstrated intracranially. CTA NECK FINDINGS: AORTIC ARCH: Normal appearance of the thoracic arch, normal branch pattern. Mild calcific atherosclerosis aortic arch. The origins of the innominate, left Common carotid artery and subclavian artery are patent. RIGHT CAROTID SYSTEM: Common carotid artery is patent, mild luminal irregularity compatible with atherosclerosis. Normal appearance of the carotid bifurcation without hemodynamically significant stenosis by NASCET criteria. Gradual loss of contrast opacification internal cerebral artery. LEFT CAROTID SYSTEM: Common carotid artery is patent, mild luminal irregularity compatible with atherosclerosis. Normal appearance of the carotid  bifurcation without hemodynamically significant stenosis by NASCET criteria. Gradual loss of contrast internal cerebral artery. VERTEBRAL ARTERIES:RIGHT vertebral artery is dominant. Gradual loss of bilateral vertebral arteries contrast opacification at V2 segment. SKELETON: See below.  Small LEFT trapezius intramuscular lipoma. OTHER NECK: Soft tissues of the neck are nonacute though, not tailored for evaluation. UPPER CHEST: Heart is enlarged, incompletely assessed. Dependent consolidation with pulmonary vascular congestion most compatible with pulmonary edema. Partially imaged suspected RIGHT hilar lymphadenopathy. Endotracheal tube tip immediately above the carina. CTA HEAD FINDINGS: Limited assessment due to poor intracranial contrast. ANTERIOR CIRCULATION: Bilateral internal cerebral arteries are patent. Narrowed and irregular anterior middle cerebral arteries old thus appearance of the RIGHT MCA bifurcation with into cutoff. In addition, bulbous A2 3 junction measuring to 2 mm. POSTERIOR CIRCULATION: No discernible contrast within the posterior circulation. VENOUS SINUSES: Contrast within the superior sagittal sinus and to lesser extent transverse sinus, likely narrowed due to intracranial hypertension. ANATOMIC VARIANTS: None. DELAYED PHASE: No abnormal intracranial enhancement though limited by presence of acute blood products. MIP images reviewed. CT CERVICAL SPINE: ALIGNMENT: Straightened lordosis.  No malalignment. SKULL BASE AND VERTEBRAE: Cervical vertebral bodies and posterior elements are intact. Severe C3-4 disc height loss with endplate sclerosis and marginal spurring compatible with degenerative discs. Moderate to severe C4-5 and moderate C6-7 degenerative discs. C1-2 articulation maintained. No destructive bony lesions. SOFT TISSUES AND SPINAL CANAL: See above. DISC LEVELS: Moderate canal stenosis C3-4. No high-grade neural foraminal narrowing. UPPER CHEST: Lung apices are clear. OTHER: None.  IMPRESSION: CTA NECK: 1. No hemodynamically significant stenosis ICA's. 2. Patent proximal vertebral arteries. 3. Cardiomegaly and findings of pulmonary edema. CTA HEAD: 1. Poor intravascular contrast attributable to poor cardiac output and increased intracranial pressure, minimally technically improved after 3 attempts. No contrast opacification of posterior circulation. 2. Patent ICA, anterior middle cerebral arteries with attenuated appearance attributable to and intracranial hypertension, possible component of vasospasm. 3. 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 CT CERVICAL SPINE: 1. No fracture or malalignment. 2. Moderate canal stenosis C3-4. Critical Value/emergent results were called by telephone at the time of interpretation on 03/14/2019 at 8:30 pm to Dr. Leonel Ramsay, Neurology, who verbally acknowledged these results. Aortic Atherosclerosis (ICD10-I70.0). Electronically Signed   By: Elon Alas M.D.   On: 02/23/2019 20:59   Ct Biopsy  Result Date: 30-Mar-2019 CLINICAL DATA:  Brain death, pre transplant donor evaluation EXAM: CT GUIDED CORE BIOPSY OF LIVER ANESTHESIA/SEDATION: None required PROCEDURE: Consent was obtained through donor services. Diagnostic CT abdomen /pelvis obtained immediately preceding this examination was utilized to localize an appropriate skin entry site, which was marked. The operative field was prepped with chlorhexidinein a sterile fashion, and a sterile drape was applied covering the operative field. A sterile gown and sterile gloves were used for the procedure. Local anesthesia was provided with 1% Lidocaine. Under CT  fluoroscopic guidance, a 59 gauge trocar needle was advanced to the periphery of the right lobe of the liver. Once needle tip position was confirmed, coaxial 18-gauge core biopsy samples were obtained, submitted in saline to surgical pathology as requested. The guide needle was  removed. Postprocedure scans show no hemorrhage or other apparent complication. The patient tolerated the procedure well. COMPLICATIONS: None immediate FINDINGS: Liver was localized. No focal lesion identified. Representative core biopsy samples of the right lobe obtained as above. IMPRESSION: 1.  Technically successful CT guided liver  core biopsy. Electronically Signed   By: Lucrezia Europe M.D.   On: 02/28/2019 10:05   Ct Cerebral Perfusion W Contrast  Result Date: 03/14/2019 CLINICAL DATA:  Follow up intracranial hemorrhage. History of hypertension. EXAM: CT PERFUSION BRAIN TECHNIQUE: Multiphase CT imaging of the brain was performed following IV bolus contrast injection. Subsequent parametric perfusion maps were calculated using RAPID software. CONTRAST:  40 cc Omnipaque 350 COMPARISON:  CT HEAD February 24, 2019 FINDINGS: Nondiagnostic examination due to lack of consistent intravascular contrast opacification. Increasingly consolidated rounded contrast at RIGHT MCA bifurcation measuring to 10 mm equivocal for aneurysm, may reflect consolidated extra-axial blood products. IMPRESSION: 1. Nondiagnostic CT perfusion brain. Electronically Signed   By: Elon Alas M.D.   On: 03/10/2019 23:23   Dg Chest Port 1 View  Result Date: 02/28/2019 CLINICAL DATA:  Liver transplant donor.  Intubated patient. EXAM: PORTABLE CHEST 1 VIEW COMPARISON:  Radiographs 05/05/2018, 01/02/2019 and 03/14/2019. FINDINGS: 0532 hours. Tip of the endotracheal tube is unchanged within the mid trachea. An enteric tube has been placed, projecting below the diaphragm. There is stable cardiomegaly and aortic atherosclerosis. Bibasilar pulmonary opacities have increased, most likely due to a combination of bibasilar atelectasis and small bilateral pleural effusions. There is no edema or pneumothorax. IMPRESSION: Increased bibasilar opacities, likely due to a combination of atelectasis and pleural effusions. No overt edema or pneumothorax.  Electronically Signed   By: Richardean Sale M.D.   On: 02/28/2019 07:55   Dg Chest Port 1 View  Result Date: 03/17/2019 CLINICAL DATA:  Initial evaluation for intubation. EXAM: PORTABLE CHEST 1 VIEW COMPARISON:  Prior radiograph from 05/05/2018. FINDINGS: Endotracheal tube in place with tip positioned 1.9 cm above the carina. Moderate cardiomegaly, stable. Mediastinal silhouette within normal limits. Lungs hypoinflated. Mild diffuse pulmonary vascular congestion without overt pulmonary edema. No focal infiltrates. No pleural effusion. No pneumothorax. No acute osseous finding. IMPRESSION: 1. Tip of the endotracheal tube positioned 1.9 cm above the carina. 2. Cardiomegaly with mild diffuse pulmonary vascular congestion without overt pulmonary edema. Electronically Signed   By: Jeannine Boga M.D.   On: 03/13/2019 20:14   Ct Head Code Stroke Wo Contrast  Result Date: 03/04/2019 CLINICAL DATA:  Code stroke.  RIGHT-sided weakness, facial droop. EXAM: CT HEAD WITHOUT CONTRAST TECHNIQUE: Contiguous axial images were obtained from the base of the skull through the vertex without intravenous contrast. COMPARISON:  CT HEAD September 30, 2010 FINDINGS: BRAIN: 4.1 x 4.5 x 5.8 cm (volume = 56 cc) ( RIGHT frontotemporal/insular hematoma with surrounding low-density vasogenic edema. 7 mm RIGHT to LEFT midline shift. Effaced RIGHT lateral ventricle without intraventricular extension. Mild global parenchymal edema with sulcal effacement. Small volume RIGHT subarachnoid hemorrhage. Basal cisterns effaced. VASCULAR: Unremarkable. SKULL/SOFT TISSUES: No skull fracture. Old bilateral nasal bone fractures. Expanded empty sella. No significant soft tissue swelling. ORBITS/SINUSES: Old RIGHT medial orbital blowout fracture. Moderate paranasal sinus mucosal thickening. Mastoid air cells are well aerated. OTHER: None. ASPECTS [facility] Stroke  Program Early CT Score) -not applicable. IMPRESSION: 1. Acute 56 cc frontotemporal/insular  acute hemorrhage. 7 mm RIGHT to LEFT midline shift. Global edema. 2. Small volume RIGHT subarachnoid hemorrhage. 3. Critical Value/emergent results were called by telephone at the time of interpretation on 03/07/2019 at 8:13 pm to Dr. Shirlyn Goltz , who verbally acknowledged these results. 4. Critical Value/emergent results text paged to [place], Neurology via [employer] secure system on 03/05/2019 at 8:10 pm, including interpreting physician's phone number. Electronically Signed   By: Elon Alas M.D.   On: 03/14/2019 20:14   Dg Or Local Abdomen  Result Date: 03/10/2019 CLINICAL DATA:  Instrument count.  Patient for organ procurement. EXAM: OR LOCAL ABDOMEN COMPARISON:  None. FINDINGS: No unexpected radiopaque foreign body is identified. IMPRESSION: As above. These results were called by telephone at the time of interpretation on 03/06/2019 at 11:32 am to operating room 8, who verbally acknowledged these results. Electronically Signed   By: Inge Rise M.D.   On: 02/25/2019 11:33    Microbiology Recent Results (from the past 240 hour(s))  Culture, blood (routine x 2) with sensitivity     Status: None   Collection Time: 03-26-2019 10:31 PM  Result Value Ref Range Status   Specimen Description BLOOD RIGHT HAND  Final   Special Requests   Final    BOTTLES DRAWN AEROBIC AND ANAEROBIC Blood Culture adequate volume   Culture   Final    NO GROWTH 5 DAYS Performed at [facility], [address], [place], [place] [address]    Report Status 03/04/2019 FINAL  Final  Culture, blood (routine x 2) with sensitivity     Status: None   Collection Time: 26-Mar-2019 10:40 PM  Result Value Ref Range Status   Specimen Description BLOOD LEFT HAND  Final   Special Requests   Final    BOTTLES DRAWN AEROBIC AND ANAEROBIC Blood Culture adequate volume   Culture   Final    NO GROWTH 5 DAYS Performed at [facility], [place] [address] [place], [place] [address]    Report Status 03/04/2019 FINAL  Final   Culture, Urine     Status: None   Collection Time: 03/22/2019  5:31 AM  Result Value Ref Range Status   Specimen Description URINE, CATHETERIZED  Final   Special Requests NONE  Final   Culture   Final    NO GROWTH Performed at [facility], [place] [address], [place], [place] [address]    Report Status 03/02/2019 FINAL  Final    Lab Basic Metabolic Panel: Recent Labs  Lab 02/28/19 1515 02/28/19 1943 02/28/19 2032 03/17/2019 0120 03/07/2019 0528  NA 164* 159* 163* 160* 157*  K 4.0 3.7 3.8 3.8 3.7  CL >130* 130*  --  >130* 129*  CO2 22 23  --  23 22  GLUCOSE 213* 235*  --  240* 241*  BUN 22* 24*  --  25* 25*  CREATININE 2.27* 2.32*  --  2.34* 2.25*  CALCIUM 8.2* 8.1*  --  7.7* 7.7*   Liver Function Tests: Recent Labs  Lab 02/28/19 1943 03/22/2019 0528  AST 75* 57*  ALT 46* 43  ALKPHOS 63 59  BILITOT 0.2* 0.4  PROT 5.6* 5.0*  ALBUMIN 2.0* 1.8*   No results for input(s): LIPASE, AMYLASE in the last 168 hours. No results for input(s): AMMONIA in the last 168 hours. CBC: Recent Labs  Lab 02/28/19 1943 02/28/19 2032  WBC 19.7*  --  HGB 12.0 10.9*  HCT 38.1 32.0*  MCV 91.6  --   PLT 139*  --    Cardiac Enzymes: No results for input(s): CKTOTAL, CKMB, CKMBINDEX, TROPONINI in the last 168 hours. Sepsis Labs: Recent Labs  Lab 02/28/19 1943  WBC 19.7*    Procedures/Operations  Mechanical ventilation, organ retrieval.    Kipp Brood 03/07/2019, 2:28 PM

## 2019-03-25 NOTE — Progress Notes (Signed)
Chaplain responded to request to make contact with family prior to extubation.  Briefly engaged brother, who is teary and actively grieving. Daughter, who is seated, does not respond verbally or with gestures to chaplain or to patient's brother when addressed.  A sister has just arrived.  Brother states they knew patient was very sick, but still not prepared for this moment.  Will monitor situation and be available as needed.   Luana Shu [phone]    2019/03/05 1500  Clinical Encounter Type  Visited With Patient and family together  Visit Type Initial;Patient actively dying  Referral From Nurse  Consult/Referral To Chaplain  Stress Factors  Family Stress Factors Major life changes

## 2019-03-25 NOTE — Progress Notes (Signed)
STROKE TEAM PROGRESS NOTE   INTERVAL HISTORY Her RNs and Dr. Lynetta Estrada are at bedside. Pt no response and lost all neuro reflexes. Not breathing over the vent. Plan for apnea test. RN had conversation with daughter and she is coming this afternoon.   Vitals:   03/09/2019 0500 03-09-2019 0600 03/09/19 0700 2019-03-09 0726  BP: 124/71 123/68 122/68 122/68  Pulse: 76 76 76 79  Resp: 15 14 15 15   Temp: (!) 97.5 F (36.4 C) (!) 97.5 F (36.4 C) (!) 97.3 F (36.3 C) (!) 97.3 F (36.3 C)  TempSrc:      SpO2: 100% 100% 97% 98%  Weight:      Height:        CBC:  Recent Labs  Lab 03/13/2019 1945  02/25/19 0440 02/25/19 0447  WBC 11.5*  --  21.8*  --   NEUTROABS 5.3  --   --   --   HGB 14.4   < > 14.2 14.3  HCT 45.3   < > 42.3 42.0  MCV 93.4  --  88.3  --   PLT 252  --  226  --    < > = values in this interval not displayed.    Basic Metabolic Panel:  Recent Labs  Lab 03/13/2019 1945 02/28/2019 1956  02/25/19 0440 02/25/19 0447  NA 138  --    < > 142 146*  K 2.9*  --    < > 4.1 4.3  CL 97*  --   --  114*  --   CO2 25  --   --  20*  --   GLUCOSE 247*  --   --  156*  --   BUN 16  --   --  20  --   CREATININE 1.70* 1.60*  --  1.64*  --   CALCIUM 8.6*  --   --  7.7*  --   MG  --   --   --  1.8  --   PHOS  --   --   --  2.5  --    < > = values in this interval not displayed.   Lipid Panel:     Component Value Date/Time   CHOL 185 10/25/2013 1051   TRIG 153 (H) 10/25/2013 1051   HDL 36 (L) 10/25/2013 1051   CHOLHDL 5.1 10/25/2013 1051   VLDL 31 10/25/2013 1051   LDLCALC 118 (H) 10/25/2013 1051   HgbA1c:  Lab Results  Component Value Date   HGBA1C (H) 07/07/2010    5.8 (NOTE)                                                                       According to the ADA Clinical Practice Recommendations for 2011, when HbA1c is used as a screening test:   >=6.5%   Diagnostic of Diabetes Mellitus           (if abnormal result  is confirmed)  5.7-6.4%   Increased risk of developing  Diabetes Mellitus  References:Diagnosis and Classification of Diabetes Mellitus,Diabetes WCHE,5277,82(UMPNT 1):S62-S69 and Standards of Medical Care in         Diabetes - 2011,Diabetes IRWE,3154,00  (Suppl 1):S11-S61.   Urine Drug Screen:  Component Value Date/Time   LABOPIA NONE DETECTED 03/15/2019 2356   COCAINSCRNUR NONE DETECTED 03/17/2019 2356   LABBENZ NONE DETECTED 03/21/2019 2356   AMPHETMU NONE DETECTED 03/21/2019 2356   THCU NONE DETECTED 03/16/2019 2356   LABBARB NONE DETECTED 03/05/2019 2356    Alcohol Level     Component Value Date/Time   ETH <10 03/11/2019 1945    IMAGING  Ct Angio Head W Or Wo Contrast Ct Angio Neck W And/or Wo Contrast CT Cervical Spine  03/05/2019 IMPRESSION:   CTA NECK:  1. No hemodynamically significant stenosis ICA's.  2. Patent proximal vertebral arteries.  3. Cardiomegaly and findings of pulmonary edema.   CTA HEAD:  1. Poor intravascular contrast attributable to poor cardiac output and increased intracranial pressure, minimally technically improved after 3 attempts. No contrast opacification of posterior circulation.  2. Patent ICA, anterior middle cerebral arteries with attenuated appearance attributable to and intracranial hypertension, possible component of vasospasm.  3. 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   CT CERVICAL SPINE:  1. No fracture or malalignment.  2. Moderate canal stenosis C3-4.    Ct Cerebral Perfusion W Contrast 03/21/2019 IMPRESSION:  1. Nondiagnostic CT perfusion brain.    Dg Chest Port 1 View 03/21/2019 IMPRESSION:  1. Tip of the endotracheal tube positioned 1.9 cm above the carina.  2. Cardiomegaly with mild diffuse pulmonary vascular congestion without overt pulmonary edema.    Ct Head Code Stroke Wo Contrast 02/23/2019 IMPRESSION:  1. Acute 56 cc frontotemporal/insular acute hemorrhage. 7 mm RIGHT to LEFT midline shift.  Global edema.  2. Small volume RIGHT subarachnoid hemorrhage.  Marland Kitchen     PHYSICAL EXAM Middle-aged obese African-American lady who was intubated and unresponsive. . Afebrile. Head is nontraumatic. Neck is supple without bruit.    Cardiac exam no murmur or gallop. Lungs are clear to auscultation. Distal pulses are well felt. Neurological Exam :  unresponsive. Intubated. Not on sedation. Eyes are closed. Eyes are in primary position. Pupils 4 mm fixed and nonreactive. Corneal reflexes are absent bilaterally. Doll's eye reflexes are absent. No respiratory effort above ventilatory setting. Fundi were not visualized. Cough and gag are absent. Caloric testing bilaterally absent. No spontaneous extremity movements, no movement with pain. No response to sternal rub in extremities. DTR diminished and no babinski.   ASSESSMENT/PLAN  [name] is a 53 y.o. female with history of malignant HTN found unresponsive with emesis and L sided weakness who developed PEA arrest enroute.    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 Hmg secondary to HTN  Code Stroke CT head 56cc frontotemporal/insular hmg. 27mm R to L shift. Global edema.  CTA head poor contrast d/t poor CO. Patent ICA. R MCA bifurcation w/o discrete aneurysm. 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   CTA neck no sign stenosis. CM and pulm edema.   CT CS no fx, mod C3-4 canal stenosis  CT perfusion nondiagnostic  SCDs for VTE prophylaxis  aspirin 81 mg daily prior to admission, now on No antithrombotic given hemorrhage.   DNR  Currently no brainstem reflex with clinical brain death - discussed with Dr Tracie Estrada and will proceed with apnea test.  Daughter, the next of kin, will come this afternoon for [address] discussion.  Acute hypoxic respiratory failure  Intubated. On vent  CCM onboard  No breathing over the vent  Plan for apnea  test.   Cardiac  arrest  Elevated troponin  Currently NSR  Also concerning for anoxic injury in addition to [place]  Currently clinical brain death  Hypotension Hypertensive Emergency  BP 240/102 at time on EMS arrival, as high as 234/187 ->118/66  Stable . SBP goal < 160  Hyperlipidemia  Home meds:  lipitor 40  Other Stroke Risk Factors  Cigarette smoker  Morbid Obesity, Body mass index is 43.5 kg/m.   Other Active Problems  Hypokalemia 3.1->4.3->3.5  Hypernatremia Na 146->164->165  AKI - Cr 1.64  Leukocytosis 21.8    (temp 100.8)  Hospital day # 3  This patient is critically ill due to [place], PEA arrest, brain herniation and at significant risk of neurological worsening, brain death form brain herniation. This patient's care requires constant monitoring of vital signs, hemodynamics, respiratory and cardiac monitoring, review of multiple databases, neurological assessment, discussion with family, other specialists and medical decision making of high complexity. I spent 30 minutes of neurocritical care time in the care of this patient. Had long discussion with Dr. Lynetta Estrada at bedside.  Pt at clinical brain death. Neurology will sign off. Please call with further questions.   Rosalin Hawking, MD PhD Stroke Neurology 03-27-2019 2:15 PM   To contact Stroke Continuity provider, please refer to [email or web address] After hours, contact General Neurology

## 2019-03-25 NOTE — Progress Notes (Signed)
NAME:  Tracie Estrada, MRN:  [id number], DOB:  1966/05/20, LOS: 3 ADMISSION DATE:  03/03/2019, CONSULTATION DATE: 03/19/2019 REFERRING MD: [facility] Fl[facility], CHIEF COMPLAINT: Altered mental status  HPI/course in hospital  53 year old woman presented with altered mental status secondary to large intracerebral hemorrhage. Significant cerebral edema with shift but has not yet progressed to brain death.  Past Medical History   Past Medical History:  Diagnosis Date  . Acute renal failure ([place]) 10/13/2013   On Vancomycin  . Breast abscess of female    Status post biopsy  . Hypertension   . Malignant hypertension 10/13/2013     Past Surgical History:  Procedure Laterality Date  . Biopsy of left breast    . INCISION AND DRAINAGE ABSCESS Left 10/11/2013   Procedure: INCISION AND DRAINAGE and debridement LEFT BREAST ABSCESS;  Surgeon: Odis Hollingshead, MD;  Location: [facility];  Service: General;  Laterality: Left;  . LEFT HEART CATH AND CORONARY ANGIOGRAPHY N/A 08/10/2017   Procedure: LEFT HEART CATH AND CORONARY ANGIOGRAPHY;  Surgeon: Jettie Booze, MD;  Location: [facility];  Service: Cardiovascular;  Laterality: N/A;      Interim history/subjective:  Remains unresponsive.  Objective   Blood pressure 133/61, pulse 75, temperature (!) 95.2 F (35.1 C), resp. rate (!) 0, height 4' 9.87" (1.47 m), weight 94 kg, last menstrual period 11/13/2017, SpO2 100 %.    Vent Mode: PRVC FiO2 (%):  [40 %-50 %] 40 % Set Rate:  [15 bmp] 15 bmp Vt Set:  [400 mL] 400 mL PEEP:  [5 cmH20] 5 cmH20 Plateau Pressure:  [19 cmH20-22 cmH20] 20 cmH20   Intake/Output Summary (Last 24 hours) at March 10, 2019 1410 Last data filed at 03-10-19 1300 Gross per 24 hour  Intake 2021.15 ml  Output 2550 ml  Net -528.85 ml   Filed Weights   02/25/19 0145 02/26/19 0500 03/10/2019 0439  Weight: 87.8 kg 93.1 kg 94 kg    Examination: Physical Exam Constitutional:      Appearance: Normal appearance.  HENT:   Head: Normocephalic and atraumatic.     Mouth/Throat:     Comments: ETT and OGT in place Eyes:     Comments: Pupils 4 mm and fixed.  No doll's eyes  Cardiovascular:     Rate and Rhythm: Normal rate and regular rhythm.     Heart sounds: Normal heart sounds.  Pulmonary:     Breath sounds: Normal breath sounds.     Comments: Now apneic on PSV Abdominal:     Palpations: Abdomen is soft.  Musculoskeletal:        General: No swelling.  Skin:    General: Skin is warm and dry.     Capillary Refill: Capillary refill takes less than 2 seconds.  Neurological:     Comments: No response to painful stimuli no cough no gag      Ancillary tests (personally reviewed)  CBC: Recent Labs  Lab 03/07/2019 1945 03/08/2019 2053 02/25/19 0440 02/25/19 0447 2019-03-10 1233 03/10/2019 1400  WBC 11.5*  --  21.8*  --   --   --   NEUTROABS 5.3  --   --   --   --   --   HGB 14.4 11.9* 14.2 14.3 10.2* 9.9*  HCT 45.3 35.0* 42.3 42.0 30.0* 29.0*  MCV 93.4  --  88.3  --   --   --   PLT 252  --  226  --   --   --  Basic Metabolic Panel: Recent Labs  Lab 03/03/2019 1945 03/13/2019 1956 03/15/2019 2053 02/25/19 0440 02/25/19 0447 03-18-19 1233 2019/03/18 1400  NA 138  --  137 142 146* 164* 165*  K 2.9*  --  3.1* 4.1 4.3 3.6 3.5  CL 97*  --   --  114*  --   --   --   CO2 25  --   --  20*  --   --   --   GLUCOSE 247*  --   --  156*  --   --   --   BUN 16  --   --  20  --   --   --   CREATININE 1.70* 1.60*  --  1.64*  --   --   --   CALCIUM 8.6*  --   --  7.7*  --   --   --   MG  --   --   --  1.8  --   --   --   PHOS  --   --   --  2.5  --   --   --    GFR: Estimated Creatinine Clearance: 38.4 mL/min (A) (by C-G formula based on SCr of 1.64 mg/dL (H)). Recent Labs  Lab 02/23/2019 1945 02/25/19 0440  WBC 11.5* 21.8*    Liver Function Tests: Recent Labs  Lab 02/23/2019 1945  AST 81*  ALT 66*  ALKPHOS 56  BILITOT 0.8  PROT 6.6  ALBUMIN 3.6   No results for input(s): LIPASE, AMYLASE in the last  168 hours. No results for input(s): AMMONIA in the last 168 hours.  ABG    Component Value Date/Time   PHART 7.202 (L) Mar 18, 2019 1400   PCO2ART 59.3 (H) March 18, 2019 1400   PO2ART 95.0 03/18/2019 1400   HCO3 23.9 03/18/19 1400   TCO2 26 18-Mar-2019 1400   ACIDBASEDEF 5.0 (H) 2019/03/18 1400   O2SAT 96.0 March 18, 2019 1400     Coagulation Profile: Recent Labs  Lab 03/05/2019 2108  INR 1.2    Cardiac Enzymes: Recent Labs  Lab 03/02/2019 1945  TROPONINI 0.04*    HbA1C: Hgb A1c MFr Bld  Date/Time Value Ref Range Status  07/07/2010 09:14 AM (H) <5.7 % Final   5.8 (NOTE)                                                                       According to the ADA Clinical Practice Recommendations for 2011, when HbA1c is used as a screening test:   >=6.5%   Diagnostic of Diabetes Mellitus           (if abnormal result  is confirmed)  5.7-6.4%   Increased risk of developing Diabetes Mellitus  References:Diagnosis and Classification of Diabetes Mellitus,Diabetes UJWJ,1914,78(GNFAO 1):S62-S69 and Standards of Medical Care in         Diabetes - 2011,Diabetes ZHYQ,6578,46  (Suppl 1):S11-S61.    CBG: Recent Labs  Lab 03/17/2019 2325 02/25/19 0352 03-18-19 0043  GLUCAP 107* 149* 90     Assessment & Plan:  Critically ill due to respiratory failure require mechanical ventilation Coma secondary to intraparenchymal cerebral hemorrhage with mass-effect and cerebral herniation She has progressed to brain death. ICH score of 4 with  expected mortality of 97%. Hypertension.  Plan:  I have performed a formal Apnea Test to confirm brain death. Have discussed the findings with the family and advised that they should come in now if the wished to pay their last respects prior to discontinuation of life support.  Best practice:  Diet: On hold Pain/Anxiety/Delirium protocol (if indicated): None VAP protocol (if indicated): Bundle in place DVT prophylaxis: Mechanical prophylaxis only due to  bleed GI prophylaxis: Pantoprazole Urinary catheter: End-of-life care Glucose control: Monitoring only Mobility: Bedrest Code Status: DNR Family Communication: daughter updated Disposition: will discuss withdrawal of care tomorrow if doesn't meet by criteria for neurological death.   Critical care time: 40 min including chart data review, examination of patient, multidisciplinary rounds, and frequent assessment and repeated neurological evaluation and discussion of goals of care with family.    Kipp Brood, MD [facility] ICU Physician [place]  Pager: [phone] Mobile: [phone] After hours: [phone].  03-26-2019, 2:10 PM

## 2019-03-25 NOTE — Progress Notes (Addendum)
[facility] Progress Note Patient Name: Tracie Estrada DOB: 26-May-1966 MRN: [id number]   Date of Service  2019/03/16  HPI/Events of Note  Pt declared brain death earlier today and CDS orders being placed.   Pt may need a central line if pt is hypotensive.   eICU Interventions  Start on pressors peripherally if needed.      Intervention Category Intermediate Interventions: Other:  Elsie Lincoln 03-16-19, 9:45 PM   1:26 AM  Reviewed abdominal xray for OGT placement.    Tip on the OGT projects over the gastric body.   Plan>  Ok to use OGT.

## 2019-03-25 NOTE — Progress Notes (Signed)
Apnea test done with MD, RN, and RT at bedside. Patient was taken off the vent, cuff deflated, with 8L oxygen bled in to ETT tube for 8 minutes. ABG was performed before and after. Patient placed back on vent after the 8 minutes. Patient is tolerating well at this time. Will continue to monitor.

## 2019-03-25 NOTE — Plan of Care (Signed)
Called CCD with time of brain death 1406 Mar 23, 2019. Spoke with Francee Piccolo.

## 2019-03-25 NOTE — Significant Event (Signed)
Declaration of death by neurological criteria:  Diagnosis: intracranial hemorrhage.  Imaging: Compatible with ICH score 4   Confounding factors: none  Clinical examination:   Response to painful stimulation to 4 limbs: none  Response to painful central stimuli: none  Pupillary response: 32mm fixed  Corneal response: none  Oculocephalic response: none  Gag reflex: none  Cough reflex: none   Apnea testing: no respiratory effort at 10 mins.   Blood gas results:  ABG    Component Value Date/Time   PHART 7.323 (L) 21-Mar-2019 1233   PCO2ART 44.0 03-21-19 1233   PO2ART 90.0 21-Mar-2019 1233   HCO3 22.9 2019/03/21 1233   TCO2 24 21-Mar-2019 1233   ACIDBASEDEF 3.0 (H) Mar 21, 2019 1233   O2SAT 96.0 2019-03-21 1233    Post ABG: 7.20/59/95  Confirmatory testing: not required   The patient was declared dead by neurological criteria at 14:06  [place] donor services was notified. Patient was declined.  Kipp Brood, MD [facility] ICU Physician [place]  Pager: [phone] Mobile: [phone] After hours: [phone].  03-21-19, 2:04 PM

## 2019-03-25 NOTE — Progress Notes (Signed)
Pharmacy Antibiotic Note  Tracie Estrada is a 53 y.o. female admitted on 02/23/2019 with cardiac arrest with ICH.  Pharmacy has been consulted for Unasyn dosing for aspiration PNA. SCr has remained stable, very poor prognosis of recovery at this time.  Plan: Unasyn 3gm IV q8h Will f/u plans of care  Height: 4' 9.87" (147 cm) Weight: 207 lb 3.7 oz (94 kg) IBW/kg (Calculated) : 40.61  Temp (24hrs), Avg:98.7 F (37.1 C), Min:97.3 F (36.3 C), Max:100.8 F (38.2 C)  Recent Labs  Lab 03/12/2019 1945 03/20/2019 1956 02/25/19 0440  WBC 11.5*  --  21.8*  CREATININE 1.70* 1.60* 1.64*    Estimated Creatinine Clearance: 38.4 mL/min (A) (by C-G formula based on SCr of 1.64 mg/dL (H)).    Allergies  Allergen Reactions  . Fish Allergy Anaphylaxis  . Peanuts [Peanut Oil] Anaphylaxis  . Other Other (See Comments)    Anesthesia.  Specific agent unknown.  Reaction unknown.   Marland Kitchen Penicillins Itching    Did it involve swelling of the face/tongue/throat, SOB, or low BP? Unk Did it involve sudden or severe rash/hives, skin peeling, or any reaction on the inside of your mouth or nose? Unk Did you need to seek medical attention at a hospital or doctor's office? Unk When did it last happen? Unk If all above answers are "NO", may proceed with cephalosporin use.   . Vancomycin Other (See Comments)    Nephrotoxicity    Antimicrobials this admission: 2/4 Unasyn >>   Microbiology results: MRSA negative   Arrie Senate, PharmD, BCPS Clinical Pharmacist [phone] Please check AMION for all [place] numbers March 17, 2019

## 2019-03-25 DEATH — deceased

## 2020-04-07 IMAGING — CT CT ABD-PELV W/ CM
2 of 5 series · 15 of 46 positions shown, 17 images · IV contrast (ISOVUE)
Comparison: 02/16/2013

CLINICAL DATA: Cough, vomiting, exposure to flu

EXAM:
CT ABDOMEN AND PELVIS WITH CONTRAST
TECHNIQUE: Multidetector CT imaging of the abdomen and pelvis was performed
using the standard protocol following bolus administration of
intravenous contrast.
CONTRAST:  100mL [id number] IOPAMIDOL ([id number]) INJECTION 61%

[Series 2: axial st · axial · 0.75mm/px · z∈[-496,-92]mm · 12 of 95 slices shown, 14 images]
[im 7/95  soft-tissue]
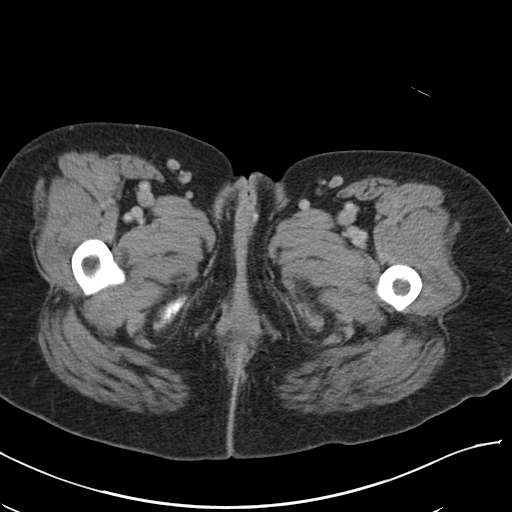
[im 7/95  bone]
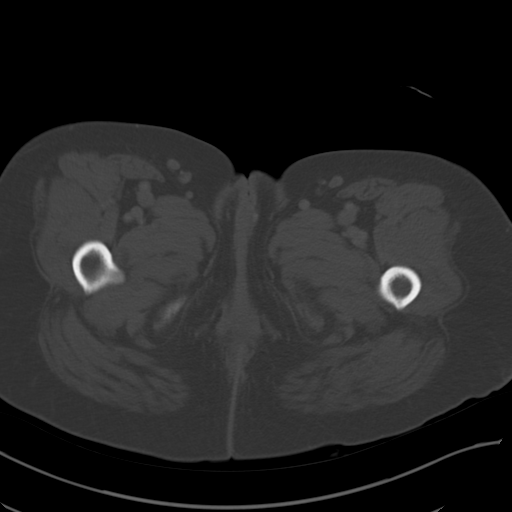
[im 14/95  soft-tissue]
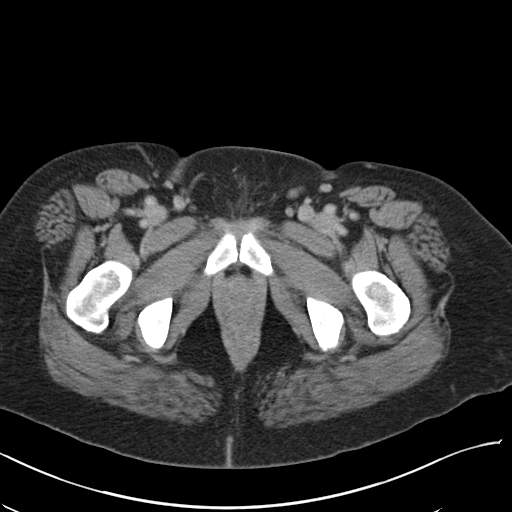
[im 21/95  soft-tissue]
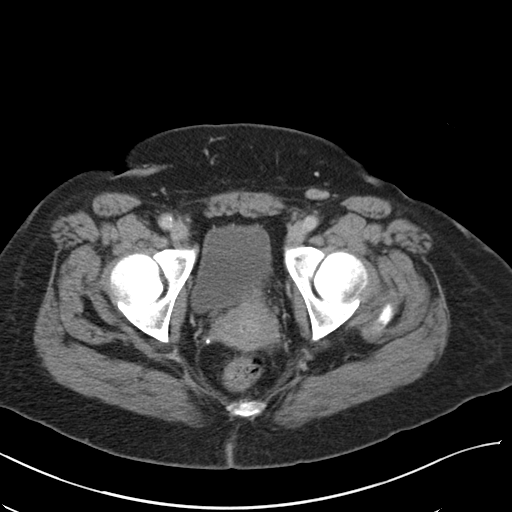
[im 27/95  soft-tissue]
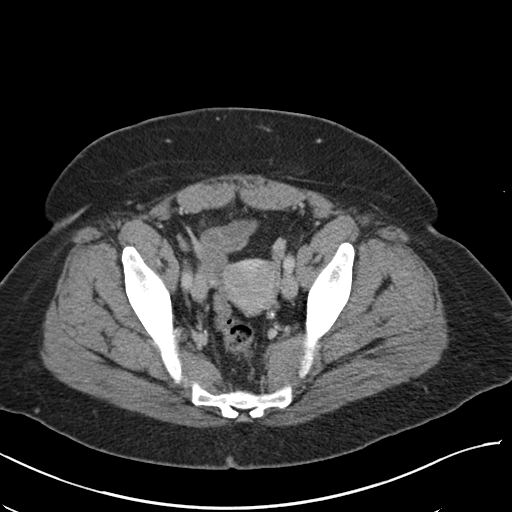
[im 34/95  soft-tissue]
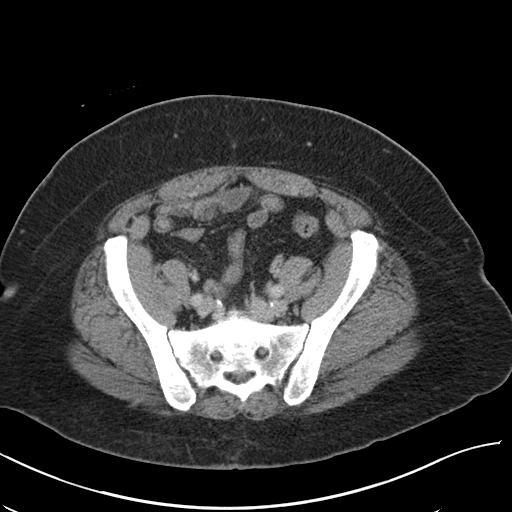
[im 41/95  soft-tissue]
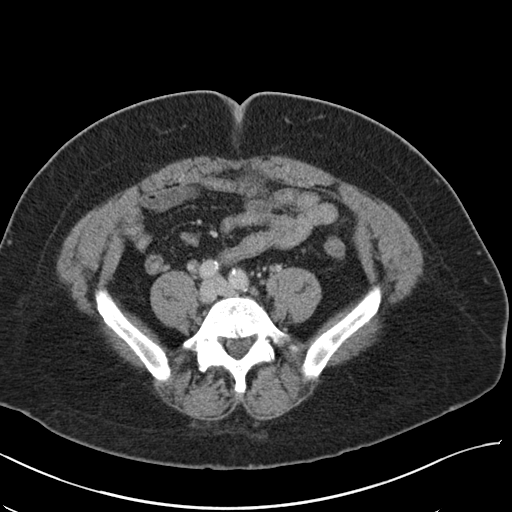
[im 54/95  soft-tissue]
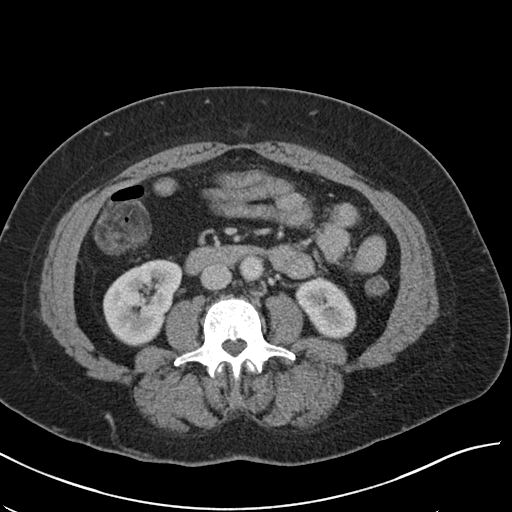
[im 61/95  soft-tissue]
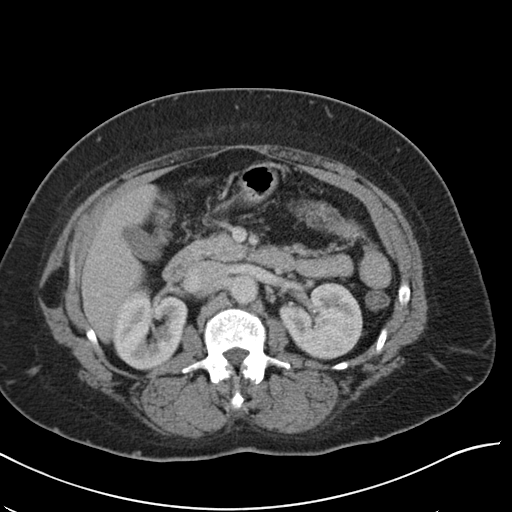
[im 68/95  soft-tissue]
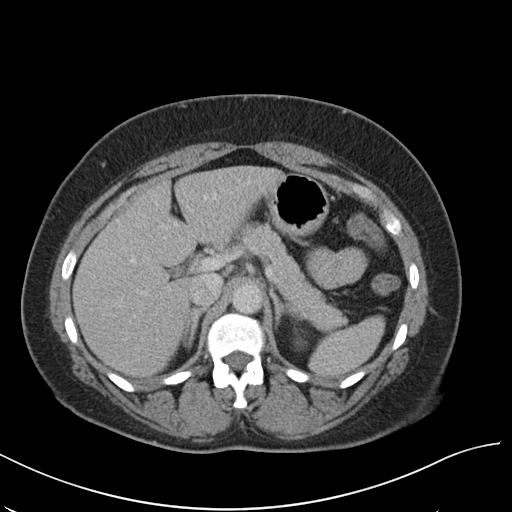
[im 68/95  bone]
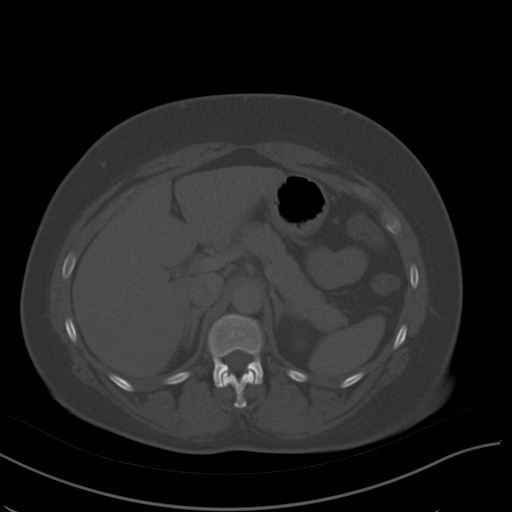
[im 74/95  soft-tissue]
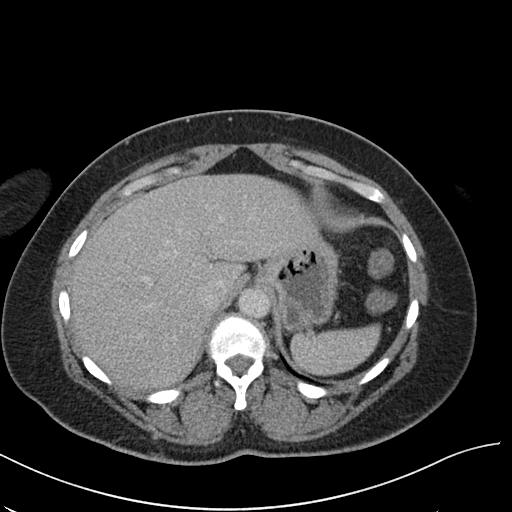
[im 81/95  soft-tissue]
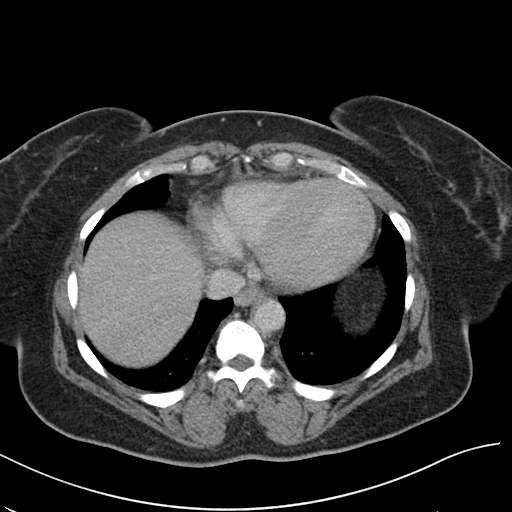
[im 88/95  soft-tissue]
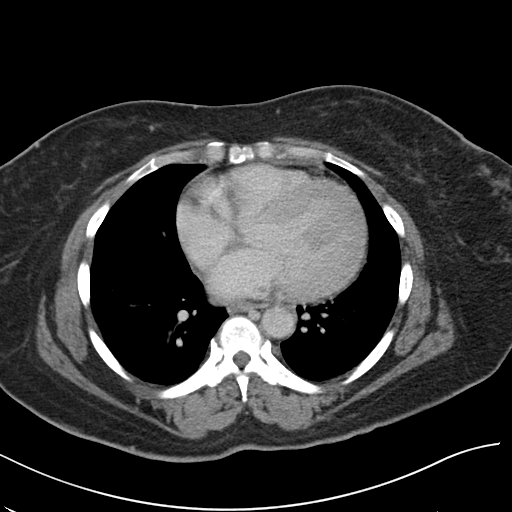

[Series 5: coronal st · coronal · 0.73mm/px · 3 of 99 slices shown]
[im 33/99  soft-tissue]
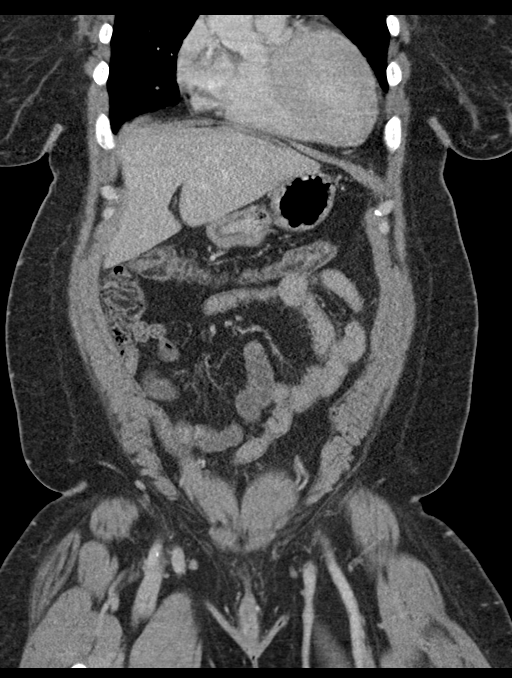
[im 44/99  soft-tissue]
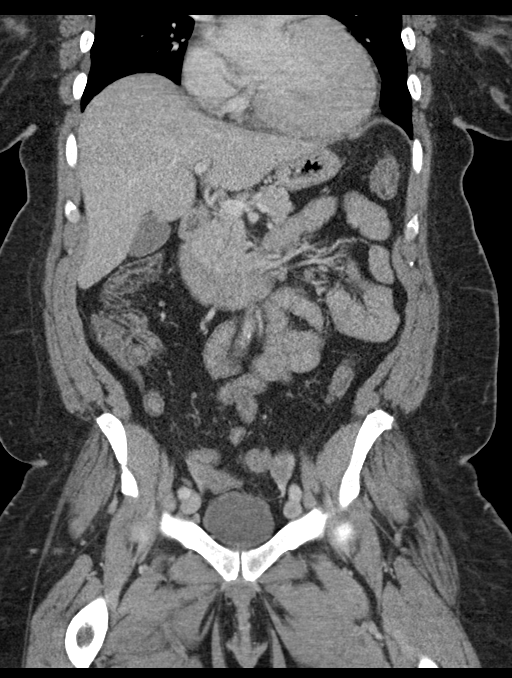
[im 55/99  soft-tissue]
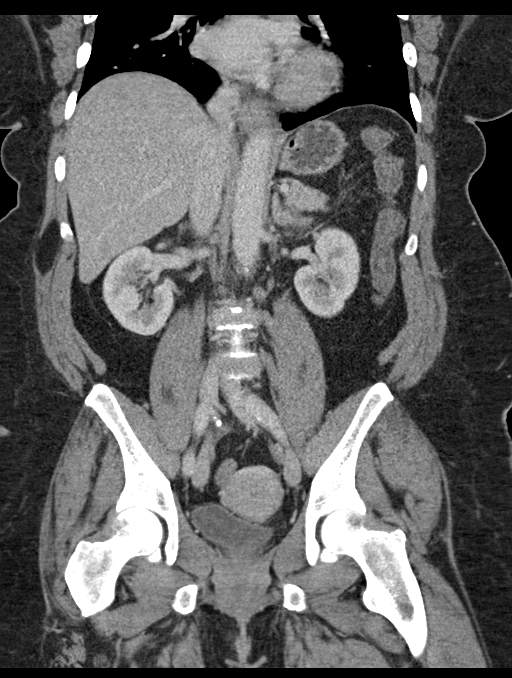

[15 of 46 positions shown; findings below may reference images not displayed]

FINDINGS: Lower chest: Lung bases are clear.

Hepatobiliary: Liver is within normal limits.

Gallbladder is unremarkable. No intrahepatic or extrahepatic ductal
dilatation.

Pancreas: Within normal limits.

Spleen: Within normal limits.

Adrenals/Urinary Tract: Adrenal glands are within normal limits.

At least two nonobstructing left upper pole renal calculi measuring
up to 3 mm (coronal image 62). Additional suspected vascular
calcification.

Right kidney is within normal limits.

No ureteral or bladder calculi.  No hydronephrosis.

Bladder is within normal limits.

Stomach/Bowel: Stomach is within normal limits.

No evidence of bowel obstruction.

Normal appendix (series 2/image 52).

Wall thickening involving the ascending and transverse colon (series
2/image 39), although with prominent submucosal fat deposition,
without surrounding inflammatory changes. Given the clinical
history, this appearance favors infectious/inflammatory colitis,
although sequela of prior/chronic infection/inflammation is
technically possible.

Left colon is decompressed.  No pneumatosis.

Vascular/Lymphatic: No evidence of abdominal aortic aneurysm.

Atherosclerotic calcifications of the abdominal aorta and branch
vessels.

No suspicious abdominopelvic lymphadenopathy.

Reproductive: Uterus is within normal limits.

Bilateral ovaries are within normal limits.

Other: No abdominopelvic ascites.

No free air.

Musculoskeletal: Visualized osseous structures are within normal
limits.
IMPRESSION: Wall thickening involving the ascending and transverse colon,
favoring infectious/inflammatory colitis given the clinical history.
No pneumatosis, ascites, or free air.

No evidence of bowel obstruction.  Normal appendix.

## 2020-04-07 IMAGING — CR DG CHEST 2V
2 series · 2 of 2 positions shown · non-contrast
Comparison: 05/05/2018

CLINICAL DATA: Weakness, shortness of breath, chest pain

EXAM:
CHEST - 2 VIEW

[w chest pa]
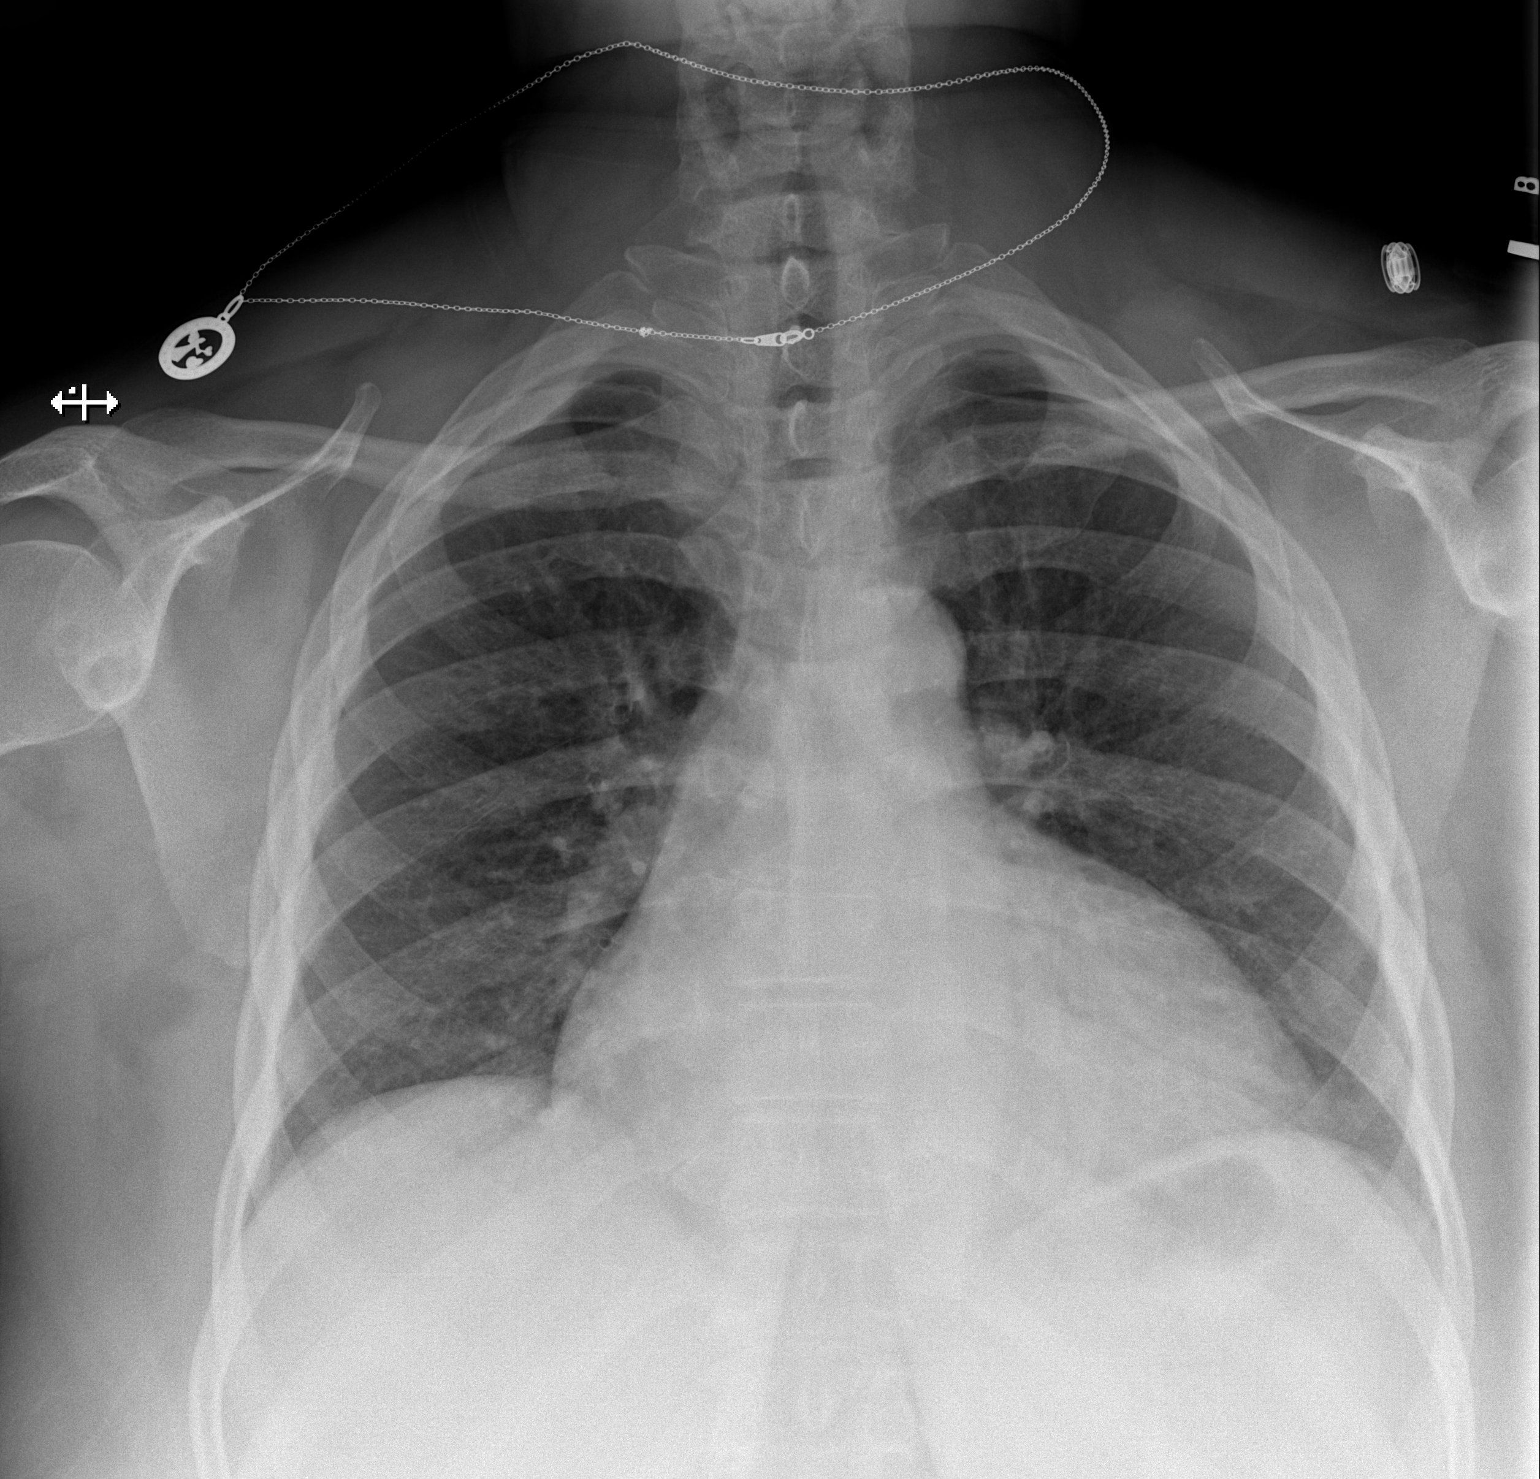

[w chest lat]
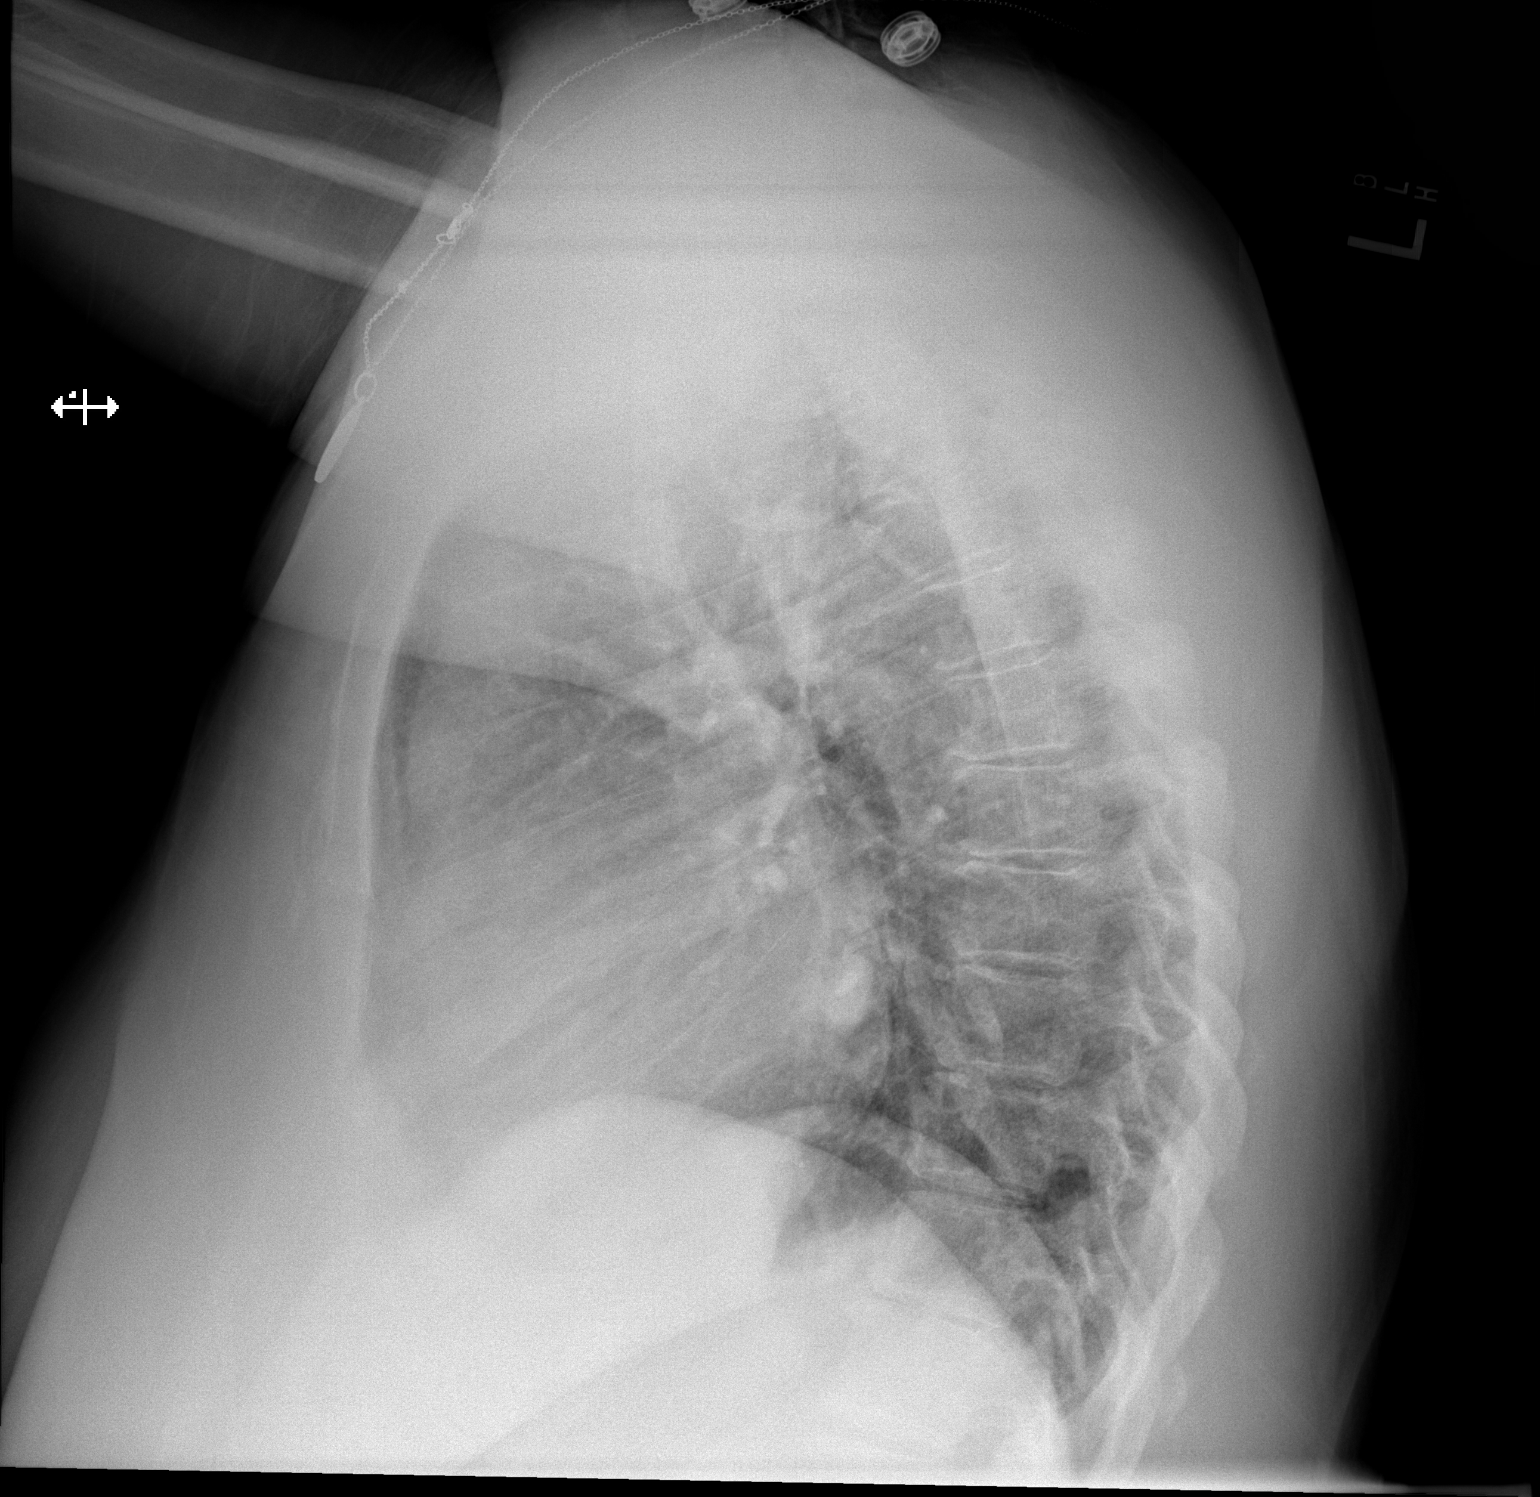

[2 of 2 positions shown; findings below may reference images not displayed]

FINDINGS: Lungs are clear.  No pleural effusion or pneumothorax.

Cardiomegaly.

Visualized osseous structures are within normal limits.
IMPRESSION: Normal chest radiographs.
